# Patient Record
Sex: Female | Born: 1960
Health system: Southern US, Community
[De-identification: ages and names within clinical notes are randomized; demographics above are authoritative.]

## PROBLEM LIST (undated history)

## (undated) DIAGNOSIS — F329 Major depressive disorder, single episode, unspecified: Secondary | ICD-10-CM

## (undated) DIAGNOSIS — I10 Essential (primary) hypertension: Secondary | ICD-10-CM

## (undated) DIAGNOSIS — F32A Depression, unspecified: Secondary | ICD-10-CM

## (undated) DIAGNOSIS — K219 Gastro-esophageal reflux disease without esophagitis: Secondary | ICD-10-CM

## (undated) DIAGNOSIS — J302 Other seasonal allergic rhinitis: Secondary | ICD-10-CM

## (undated) HISTORY — PX: LUMBAR DISC SURGERY: SHX700

## (undated) HISTORY — PX: ABDOMINAL HYSTERECTOMY: SHX81

---

## 2003-10-10 ENCOUNTER — Other Ambulatory Visit: Payer: Self-pay

## 2006-10-06 ENCOUNTER — Ambulatory Visit: Payer: Self-pay | Admitting: Unknown Physician Specialty

## 2006-10-07 ENCOUNTER — Emergency Department: Payer: Self-pay | Admitting: Emergency Medicine

## 2006-11-15 ENCOUNTER — Ambulatory Visit: Payer: Self-pay | Admitting: Unknown Physician Specialty

## 2006-11-18 ENCOUNTER — Ambulatory Visit: Payer: Self-pay | Admitting: Unknown Physician Specialty

## 2006-11-29 ENCOUNTER — Ambulatory Visit: Payer: Self-pay | Admitting: Unknown Physician Specialty

## 2007-02-15 ENCOUNTER — Ambulatory Visit: Payer: Self-pay | Admitting: Unknown Physician Specialty

## 2008-08-17 ENCOUNTER — Ambulatory Visit: Payer: Self-pay | Admitting: Orthopedic Surgery

## 2010-01-20 ENCOUNTER — Ambulatory Visit: Payer: Self-pay

## 2010-08-26 ENCOUNTER — Ambulatory Visit: Payer: Self-pay | Admitting: Internal Medicine

## 2012-03-22 ENCOUNTER — Ambulatory Visit: Payer: Self-pay | Admitting: Internal Medicine

## 2012-08-26 ENCOUNTER — Emergency Department: Payer: Self-pay | Admitting: Emergency Medicine

## 2012-08-28 LAB — BETA STREP CULTURE(ARMC)

## 2013-03-03 ENCOUNTER — Ambulatory Visit: Payer: Self-pay | Admitting: Otolaryngology

## 2013-04-05 ENCOUNTER — Emergency Department: Payer: Self-pay | Admitting: Emergency Medicine

## 2013-04-05 LAB — URINALYSIS, COMPLETE
Bacteria: NONE SEEN
Bilirubin,UR: NEGATIVE
Glucose,UR: NEGATIVE mg/dL (ref 0–75)
Ketone: NEGATIVE
Leukocyte Esterase: NEGATIVE
Nitrite: NEGATIVE
Ph: 7 (ref 4.5–8.0)
Protein: NEGATIVE
RBC,UR: NONE SEEN /HPF (ref 0–5)
Specific Gravity: 1.004 (ref 1.003–1.030)
Squamous Epithelial: 1
WBC UR: 1 /HPF (ref 0–5)

## 2013-04-05 LAB — CBC WITH DIFFERENTIAL/PLATELET
Basophil #: 0 10*3/uL (ref 0.0–0.1)
Basophil %: 0.4 %
Eosinophil #: 0 10*3/uL (ref 0.0–0.7)
Eosinophil %: 1.2 %
HCT: 37 % (ref 35.0–47.0)
HGB: 12.4 g/dL (ref 12.0–16.0)
Lymphocyte #: 1.4 10*3/uL (ref 1.0–3.6)
Lymphocyte %: 34.8 %
MCH: 27 pg (ref 26.0–34.0)
MCHC: 33.7 g/dL (ref 32.0–36.0)
MCV: 80 fL (ref 80–100)
Monocyte #: 0.5 x10 3/mm (ref 0.2–0.9)
Monocyte %: 13.5 %
Neutrophil #: 2 10*3/uL (ref 1.4–6.5)
Neutrophil %: 50.1 %
Platelet: 195 10*3/uL (ref 150–440)
RBC: 4.6 10*6/uL (ref 3.80–5.20)
RDW: 14 % (ref 11.5–14.5)
WBC: 4 10*3/uL (ref 3.6–11.0)

## 2013-04-05 LAB — COMPREHENSIVE METABOLIC PANEL
Albumin: 3.9 g/dL (ref 3.4–5.0)
Alkaline Phosphatase: 167 U/L — ABNORMAL HIGH
Anion Gap: 5 — ABNORMAL LOW (ref 7–16)
BUN: 9 mg/dL (ref 7–18)
Bilirubin,Total: 0.3 mg/dL (ref 0.2–1.0)
Calcium, Total: 9.7 mg/dL (ref 8.5–10.1)
Chloride: 103 mmol/L (ref 98–107)
Co2: 31 mmol/L (ref 21–32)
Creatinine: 0.83 mg/dL (ref 0.60–1.30)
EGFR (African American): >60
EGFR (Non-African Amer.): >60
Glucose: 103 mg/dL — ABNORMAL HIGH (ref 65–99)
Osmolality: 276 (ref 275–301)
Potassium: 4 mmol/L (ref 3.5–5.1)
SGOT(AST): 25 U/L (ref 15–37)
SGPT (ALT): 20 U/L (ref 12–78)
Sodium: 139 mmol/L (ref 136–145)
Total Protein: 8.3 g/dL — ABNORMAL HIGH (ref 6.4–8.2)

## 2013-04-05 LAB — TROPONIN I: Troponin-I: <0.02 ng/mL

## 2013-04-05 LAB — LIPASE, BLOOD: Lipase: 76 U/L (ref 73–393)

## 2014-01-31 DIAGNOSIS — I10 Essential (primary) hypertension: Secondary | ICD-10-CM | POA: Insufficient documentation

## 2014-01-31 DIAGNOSIS — J309 Allergic rhinitis, unspecified: Secondary | ICD-10-CM | POA: Insufficient documentation

## 2014-03-30 ENCOUNTER — Emergency Department: Payer: Self-pay | Admitting: Emergency Medicine

## 2015-01-25 DIAGNOSIS — R5383 Other fatigue: Secondary | ICD-10-CM | POA: Insufficient documentation

## 2015-01-25 DIAGNOSIS — K219 Gastro-esophageal reflux disease without esophagitis: Secondary | ICD-10-CM | POA: Insufficient documentation

## 2015-07-05 ENCOUNTER — Other Ambulatory Visit: Payer: Self-pay | Admitting: Nurse Practitioner

## 2015-07-05 ENCOUNTER — Ambulatory Visit
Admission: RE | Admit: 2015-07-05 | Discharge: 2015-07-05 | Disposition: A | Discharge status: Home / Self Care | Payer: BLUE CROSS/BLUE SHIELD | Source: Ambulatory Visit | Attending: Nurse Practitioner | Admitting: Nurse Practitioner

## 2015-07-05 DIAGNOSIS — M5440 Lumbago with sciatica, unspecified side: Secondary | ICD-10-CM

## 2015-07-05 DIAGNOSIS — R634 Abnormal weight loss: Secondary | ICD-10-CM

## 2015-07-05 DIAGNOSIS — Z1231 Encounter for screening mammogram for malignant neoplasm of breast: Secondary | ICD-10-CM

## 2015-07-30 ENCOUNTER — Ambulatory Visit: Payer: BLUE CROSS/BLUE SHIELD

## 2015-08-09 ENCOUNTER — Ambulatory Visit
Admission: RE | Admit: 2015-08-09 | Discharge: 2015-08-09 | Disposition: A | Discharge status: Home / Self Care | Payer: BLUE CROSS/BLUE SHIELD | Source: Ambulatory Visit | Attending: Nurse Practitioner | Admitting: Nurse Practitioner

## 2015-08-09 DIAGNOSIS — M5440 Lumbago with sciatica, unspecified side: Secondary | ICD-10-CM | POA: Diagnosis present

## 2015-08-09 DIAGNOSIS — M5136 Other intervertebral disc degeneration, lumbar region: Secondary | ICD-10-CM | POA: Diagnosis not present

## 2015-08-09 DIAGNOSIS — M5126 Other intervertebral disc displacement, lumbar region: Secondary | ICD-10-CM | POA: Insufficient documentation

## 2015-08-09 DIAGNOSIS — M4806 Spinal stenosis, lumbar region: Secondary | ICD-10-CM | POA: Diagnosis not present

## 2015-08-13 ENCOUNTER — Telehealth: Payer: Self-pay | Admitting: Gastroenterology

## 2015-08-13 NOTE — Telephone Encounter (Signed)
colonoscopy

## 2015-08-22 ENCOUNTER — Ambulatory Visit
Admission: RE | Admit: 2015-08-22 | Discharge: 2015-08-22 | Disposition: A | Discharge status: Home / Self Care | Payer: BLUE CROSS/BLUE SHIELD | Source: Ambulatory Visit | Attending: Nurse Practitioner | Admitting: Nurse Practitioner

## 2015-08-22 DIAGNOSIS — Z1231 Encounter for screening mammogram for malignant neoplasm of breast: Secondary | ICD-10-CM | POA: Diagnosis not present

## 2015-08-23 ENCOUNTER — Other Ambulatory Visit: Payer: Self-pay

## 2015-08-23 ENCOUNTER — Telehealth: Payer: Self-pay

## 2015-08-23 MED ORDER — PEG 3350-KCL-NABCB-NACL-NASULF 236 G PO SOLR: 4000.0000 mL | Freq: Once | ORAL | Status: DC

## 2015-08-23 NOTE — Telephone Encounter (Signed)
[facility]. Instructs/rx mailed. Please precert.

## 2015-08-23 NOTE — Telephone Encounter (Signed)
Gastroenterology Pre-Procedure Review  Request Date: 08/30/15 Requesting Physician: Dr.   PATIENT REVIEW QUESTIONS: The patient responded to the following health history questions as indicated:    1. Are you having any GI issues? no 2. Do you have a personal history of Polyps? no 3. Do you have a family history of Colon Cancer or Polyps? no 4. Diabetes Mellitus? no 5. Joint replacements in the past 12 months?no 6. Major health problems in the past 3 months?no 7. Any artificial heart valves, MVP, or defibrillator?no    MEDICATIONS & ALLERGIES:    Patient reports the following regarding taking any anticoagulation/antiplatelet therapy:   Plavix, Coumadin, Eliquis, Xarelto, Lovenox, Pradaxa, Brilinta, or Effient? no Aspirin? no  Patient confirms/reports the following medications:  Current Outpatient Prescriptions  Medication Sig Dispense Refill  . ranitidine (ZANTAC) 300 MG tablet Take 300 mg by mouth.    . hydrochlorothiazide (HYDRODIURIL) 25 MG tablet Take 25 mg by mouth.     No current facility-administered medications for this visit.    Patient confirms/reports the following allergies:  Allergies  Allergen Reactions  . Penicillins     No orders of the defined types were placed in this encounter.    AUTHORIZATION INFORMATION Primary Insurance: 1D#: Group #:  Secondary Insurance: 1D#: Group #:  SCHEDULE INFORMATION: Date: 08/30/15 Time: Location:

## 2015-08-28 ENCOUNTER — Encounter: Payer: Self-pay | Admitting: *Deleted

## 2015-08-29 ENCOUNTER — Other Ambulatory Visit: Payer: Self-pay | Admitting: Nurse Practitioner

## 2015-08-29 DIAGNOSIS — R928 Other abnormal and inconclusive findings on diagnostic imaging of breast: Secondary | ICD-10-CM

## 2015-08-29 NOTE — Discharge Instructions (Signed)

## 2015-08-30 ENCOUNTER — Ambulatory Visit: Payer: BLUE CROSS/BLUE SHIELD | Admitting: Anesthesiology

## 2015-08-30 ENCOUNTER — Ambulatory Visit
Admission: RE | Admit: 2015-08-30 | Discharge: 2015-08-30 | Disposition: A | Discharge status: Home / Self Care | Payer: BLUE CROSS/BLUE SHIELD | Source: Ambulatory Visit | Attending: Gastroenterology | Admitting: Gastroenterology

## 2015-08-30 ENCOUNTER — Encounter
Admission: RE | Disposition: A | Discharge status: Home / Self Care | Payer: Self-pay | Source: Ambulatory Visit | Attending: Gastroenterology

## 2015-08-30 DIAGNOSIS — K219 Gastro-esophageal reflux disease without esophagitis: Secondary | ICD-10-CM | POA: Insufficient documentation

## 2015-08-30 DIAGNOSIS — I10 Essential (primary) hypertension: Secondary | ICD-10-CM | POA: Insufficient documentation

## 2015-08-30 DIAGNOSIS — Z880 Allergy status to penicillin: Secondary | ICD-10-CM | POA: Insufficient documentation

## 2015-08-30 DIAGNOSIS — Z79899 Other long term (current) drug therapy: Secondary | ICD-10-CM | POA: Insufficient documentation

## 2015-08-30 DIAGNOSIS — J302 Other seasonal allergic rhinitis: Secondary | ICD-10-CM | POA: Insufficient documentation

## 2015-08-30 DIAGNOSIS — Z90710 Acquired absence of both cervix and uterus: Secondary | ICD-10-CM | POA: Insufficient documentation

## 2015-08-30 DIAGNOSIS — F329 Major depressive disorder, single episode, unspecified: Secondary | ICD-10-CM | POA: Insufficient documentation

## 2015-08-30 DIAGNOSIS — Z1211 Encounter for screening for malignant neoplasm of colon: Secondary | ICD-10-CM | POA: Diagnosis not present

## 2015-08-30 HISTORY — DX: Other seasonal allergic rhinitis: J30.2

## 2015-08-30 HISTORY — PX: COLONOSCOPY WITH PROPOFOL: SHX5780

## 2015-08-30 HISTORY — DX: Depression, unspecified: F32.A

## 2015-08-30 HISTORY — DX: Major depressive disorder, single episode, unspecified: F32.9

## 2015-08-30 HISTORY — DX: Essential (primary) hypertension: I10

## 2015-08-30 HISTORY — DX: Gastro-esophageal reflux disease without esophagitis: K21.9

## 2015-08-30 SURGERY — COLONOSCOPY WITH PROPOFOL
Anesthesia: Monitor Anesthesia Care | Wound class: Contaminated

## 2015-08-30 MED ORDER — LACTATED RINGERS IV SOLN: INTRAVENOUS | Status: DC

## 2015-08-30 MED ORDER — STERILE WATER FOR IRRIGATION IR SOLN
Status: DC | PRN
  Administered 2015-08-30: 12:00:00

## 2015-08-30 MED ORDER — LACTATED RINGERS IV SOLN
INTRAVENOUS | Status: DC | PRN
  Administered 2015-08-30: 12:00:00 via INTRAVENOUS

## 2015-08-30 MED ORDER — ACETAMINOPHEN 160 MG/5ML PO SOLN: 325.0000 mg | ORAL | Status: DC | PRN

## 2015-08-30 MED ORDER — ACETAMINOPHEN 325 MG PO TABS: 325.0000 mg | ORAL_TABLET | ORAL | Status: DC | PRN

## 2015-08-30 MED ORDER — LIDOCAINE HCL (CARDIAC) 20 MG/ML IV SOLN
INTRAVENOUS | Status: DC | PRN
  Administered 2015-08-30: 40 mg via INTRAVENOUS

## 2015-08-30 MED ORDER — PROPOFOL 10 MG/ML IV BOLUS
INTRAVENOUS | Status: DC | PRN
  Administered 2015-08-30: 100 mg via INTRAVENOUS

## 2015-08-30 MED ORDER — ONDANSETRON HCL 4 MG/2ML IJ SOLN: 4.0000 mg | Freq: Once | INTRAMUSCULAR | Status: DC | PRN

## 2015-08-30 SURGICAL SUPPLY — 22 items
CANISTER SUCT 1200ML W/VALVE (MISCELLANEOUS) ×3 IMPLANT
CLIP HMST 235XBRD CATH ROT (MISCELLANEOUS) IMPLANT
CLIP RESOLUTION 360 11X235 (MISCELLANEOUS)
FCP ESCP3.2XJMB 240X2.8X (MISCELLANEOUS)
FORCEPS BIOP RAD 4 LRG CAP 4 (CUTTING FORCEPS) IMPLANT
FORCEPS BIOP RJ4 240 W/NDL (MISCELLANEOUS)
FORCEPS ESCP3.2XJMB 240X2.8X (MISCELLANEOUS) IMPLANT
GOWN CVR UNV OPN BCK APRN NK (MISCELLANEOUS) ×2 IMPLANT
GOWN ISOL THUMB LOOP REG UNIV (MISCELLANEOUS) ×4
INJECTOR VARIJECT VIN23 (MISCELLANEOUS) IMPLANT
KIT DEFENDO VALVE AND CONN (KITS) IMPLANT
KIT ENDO PROCEDURE OLY (KITS) ×3 IMPLANT
MARKER SPOT ENDO TATTOO 5ML (MISCELLANEOUS) IMPLANT
PAD GROUND ADULT SPLIT (MISCELLANEOUS) IMPLANT
PROBE APC STR FIRE (PROBE) IMPLANT
SNARE SHORT THROW 13M SML OVAL (MISCELLANEOUS) IMPLANT
SNARE SHORT THROW 30M LRG OVAL (MISCELLANEOUS) IMPLANT
SNARE SNG USE RND 15MM (INSTRUMENTS) IMPLANT
SPOT EX ENDOSCOPIC TATTOO (MISCELLANEOUS)
TRAP ETRAP POLY (MISCELLANEOUS) IMPLANT
VARIJECT INJECTOR VIN23 (MISCELLANEOUS)
WATER STERILE IRR 250ML POUR (IV SOLUTION) ×3 IMPLANT

## 2015-08-30 NOTE — H&P (Signed)
  [facility]  [address] [place], [place] [address] Phone: [phone] Fax : [phone]  Primary Care Physician:  [facility] Primary Gastroenterologist:  Dr. Servando SnareWohl  Pre-Procedure History & Physical: HPI:  Ansel BongDorothy D Roane is a 55 y.o. female is here for a screening colonoscopy.   Past Medical History  Diagnosis Date  . Seasonal allergies   . Hypertension   . Depression   . GERD (gastroesophageal reflux disease)     Past Surgical History  Procedure Laterality Date  . Abdominal hysterectomy      Prior to Admission medications   Medication Sig Start Date End Date Taking? Authorizing Provider  cetirizine (ZYRTEC) 10 MG tablet Take 10 mg by mouth daily.   Yes Historical Provider, MD  GuaiFENesin (TUSSIN PO) Take by mouth daily as needed.   Yes Historical Provider, MD  hydrochlorothiazide (HYDRODIURIL) 25 MG tablet Take 25 mg by mouth.   Yes Historical Provider, MD  Multiple Vitamin (MULTIVITAMIN) capsule Take 1 capsule by mouth daily.   Yes Historical Provider, MD  sertraline (ZOLOFT) 50 MG tablet Take 50 mg by mouth daily.   Yes Historical Provider, MD  polyethylene glycol (GOLYTELY) 236 g solution Take 4,000 mLs by mouth once. Drink one 8 oz glass every 20 mins until stools are clear. 08/23/15   Midge Miniumarren Marlaine Arey, MD    Allergies as of 08/23/2015 - never reviewed  Allergen Reaction Noted  . Penicillins  08/23/2015    Family History  Problem Relation Age of Onset  . Breast cancer Neg Hx     Social History   Social History  . Marital Status: Single    Spouse Name: N/A  . Number of Children: N/A  . Years of Education: N/A   Occupational History  . Not on file.   Social History Main Topics  . Smoking status: Never Smoker   . Smokeless tobacco: Not on file  . Alcohol Use: No  . Drug Use: Not on file  . Sexual Activity: Not on file   Other Topics Concern  . Not on file   Social History Narrative    Review of  Systems: See HPI, otherwise negative ROS  Physical Exam: BP 135/75 mmHg  Pulse 57  Temp(Src) 98.2 F (36.8 C) (Temporal)  Ht 5\' 7"  (1.702 m)  Wt 132 lb (59.875 kg)  BMI 20.67 kg/m2  SpO2 100% General:   Alert,  pleasant and cooperative in NAD Head:  Normocephalic and atraumatic. Neck:  Supple; no masses or thyromegaly. Lungs:  Clear throughout to auscultation.    Heart:  Regular rate and rhythm. Abdomen:  Soft, nontender and nondistended. Normal bowel sounds, without guarding, and without rebound.   Neurologic:  Alert and  oriented x4;  grossly normal neurologically.  Impression/Plan: Ansel BongDorothy D Sun is now here to undergo a screening colonoscopy.  Risks, benefits, and alternatives regarding colonoscopy have been reviewed with the patient.  Questions have been answered.  All parties agreeable.

## 2015-08-30 NOTE — Transfer of Care (Signed)
Immediate Anesthesia Transfer of Care Note  Patient: Kristin Mcdowell  Procedure(s) Performed: Procedure(s): COLONOSCOPY WITH PROPOFOL (N/A)  Patient Location: PACU  Anesthesia Type: MAC  Level of Consciousness: awake, alert  and patient cooperative  Airway and Oxygen Therapy: Patient Spontanous Breathing and Patient connected to supplemental oxygen  Post-op Assessment: Post-op Vital signs reviewed, Patient's Cardiovascular Status Stable, Respiratory Function Stable, Patent Airway and No signs of Nausea or vomiting  Post-op Vital Signs: Reviewed and stable  Complications: No apparent anesthesia complications

## 2015-08-30 NOTE — Op Note (Signed)
[facility] Gastroenterology Patient Name: Kristin BoyerDorothy Mcdowell Procedure Date: 08/30/2015 11:44 AM MRN: [id number] Account #: [id number] Date of Birth: May 03, 1960 Admit Type: Outpatient Age: [age] [facility] Gender: Female Note Status: Finalized Procedure:            Colonoscopy Indications:          Screening for colorectal malignant neoplasm Providers:            Midge Miniumarren Savian Mazon, MD Referring MD:         Dustin Folkslinic Scott, MD (Referring MD) Medicines:            Propofol per Anesthesia Complications:        No immediate complications. Procedure:            Pre-Anesthesia Assessment:                       - Prior to the procedure, a History and Physical was                        performed, and patient medications and allergies were                        reviewed. The patient's tolerance of previous                        anesthesia was also reviewed. The risks and benefits of                        the procedure and the sedation options and risks were                        discussed with the patient. All questions were                        answered, and informed consent was obtained. Prior                        Anticoagulants: The patient has taken no previous                        anticoagulant or antiplatelet agents. ASA Grade                        Assessment: II - A patient with mild systemic disease.                        After reviewing the risks and benefits, the patient was                        deemed in satisfactory condition to undergo the                        procedure.                       After obtaining informed consent, the colonoscope was                        passed under direct vision. Throughout the procedure,  the patient's blood pressure, pulse, and oxygen                        saturations were monitored continuously. The Olympus CF                        H180AL colonoscope (S#: [id number]) was introduced through                         the anus and advanced to the the cecum, identified by                        appendiceal orifice and ileocecal valve. The                        colonoscopy was performed without difficulty. The                        patient tolerated the procedure well. The quality of                        the bowel preparation was excellent. Findings:      The perianal and digital rectal examinations were normal.      The colon (entire examined portion) appeared normal. Impression:           - The entire examined colon is normal.                       - No specimens collected. Recommendation:       - Repeat colonoscopy in 10 years for screening unless                        any change in family history or lower GI problems. Procedure Code(s):    --- Professional ---                       [phone], Colonoscopy, flexible; diagnostic, including                        collection of specimen(s) by brushing or washing, when                        performed (separate procedure) Diagnosis Code(s):    --- Professional ---                       Z12.11, Encounter for screening for malignant neoplasm                        of colon CPT copyright 2016 American Medical Association. All rights reserved. The codes documented in this report are preliminary and upon coder review may  be revised to meet current compliance requirements. Midge Minium, MD 08/30/2015 12:03:14 PM This report has been signed electronically. Number of Addenda: 0 Note Initiated On: 08/30/2015 11:44 AM Scope Withdrawal Time: 0 hours 6 minutes 9 seconds  Total Procedure Duration: 0 hours 10 minutes 50 seconds       [facility]

## 2015-08-30 NOTE — Anesthesia Preprocedure Evaluation (Signed)
Anesthesia Evaluation  Patient identified by MRN, date of birth, ID band Patient awake    Reviewed: Allergy & Precautions, H&P , NPO status   Airway Mallampati: II  TM Distance: >3 FB Neck ROM: full    Dental   Pulmonary    breath sounds clear to auscultation       Cardiovascular hypertension,  Rhythm:regular Rate:Normal     Neuro/Psych PSYCHIATRIC DISORDERS    GI/Hepatic GERD  ,  Endo/Other    Renal/GU      Musculoskeletal   Abdominal   Peds  Hematology   Anesthesia Other Findings   Reproductive/Obstetrics                             Anesthesia Physical Anesthesia Plan  ASA: II  Anesthesia Plan: MAC   Post-op Pain Management:    Induction:   Airway Management Planned:   Additional Equipment:   Intra-op Plan:   Post-operative Plan:   Informed Consent: I have reviewed the patients History and Physical, chart, labs and discussed the procedure including the risks, benefits and alternatives for the proposed anesthesia with the patient or authorized representative who has indicated his/her understanding and acceptance.     Plan Discussed with: CRNA  Anesthesia Plan Comments:         Anesthesia Quick Evaluation

## 2015-08-30 NOTE — Anesthesia Procedure Notes (Signed)
Procedure Name: MAC Performed by: Johann Gascoigne Pre-anesthesia Checklist: Patient identified, Emergency Drugs available, Suction available, Patient being monitored and Timeout performed Patient Re-evaluated:Patient Re-evaluated prior to inductionOxygen Delivery Method: Nasal cannula       

## 2015-08-30 NOTE — Anesthesia Postprocedure Evaluation (Signed)
Anesthesia Post Note  Patient: Kristin Mcdowell  Procedure(s) Performed: Procedure(s) (LRB): COLONOSCOPY WITH PROPOFOL (N/A)  Patient location during evaluation: PACU Anesthesia Type: MAC Level of consciousness: awake and alert Pain management: pain level controlled Vital Signs Assessment: post-procedure vital signs reviewed and stable Respiratory status: spontaneous breathing, nonlabored ventilation, respiratory function stable and patient connected to nasal cannula oxygen Cardiovascular status: stable and blood pressure returned to baseline Anesthetic complications: no    Durene Fruitshomas,  Mei Suits G

## 2015-09-02 ENCOUNTER — Encounter: Payer: Self-pay | Admitting: Gastroenterology

## 2015-09-27 ENCOUNTER — Other Ambulatory Visit: Payer: BLUE CROSS/BLUE SHIELD

## 2015-10-04 ENCOUNTER — Ambulatory Visit
Admission: RE | Admit: 2015-10-04 | Discharge: 2015-10-04 | Disposition: A | Discharge status: Home / Self Care | Payer: BLUE CROSS/BLUE SHIELD | Source: Ambulatory Visit | Attending: Nurse Practitioner | Admitting: Nurse Practitioner

## 2015-10-04 DIAGNOSIS — N63 Unspecified lump in breast: Secondary | ICD-10-CM | POA: Diagnosis not present

## 2015-10-04 DIAGNOSIS — R928 Other abnormal and inconclusive findings on diagnostic imaging of breast: Secondary | ICD-10-CM | POA: Diagnosis present

## 2016-09-17 ENCOUNTER — Encounter: Payer: Self-pay | Admitting: Emergency Medicine

## 2016-09-17 ENCOUNTER — Emergency Department
Admission: EM | Admit: 2016-09-17 | Discharge: 2016-09-17 | Disposition: A | Discharge status: Home / Self Care | Payer: Medicaid Other | Attending: Emergency Medicine | Admitting: Emergency Medicine

## 2016-09-17 DIAGNOSIS — H6981 Other specified disorders of Eustachian tube, right ear: Secondary | ICD-10-CM

## 2016-09-17 DIAGNOSIS — I10 Essential (primary) hypertension: Secondary | ICD-10-CM | POA: Insufficient documentation

## 2016-09-17 DIAGNOSIS — R0981 Nasal congestion: Secondary | ICD-10-CM | POA: Insufficient documentation

## 2016-09-17 DIAGNOSIS — J019 Acute sinusitis, unspecified: Secondary | ICD-10-CM | POA: Diagnosis not present

## 2016-09-17 DIAGNOSIS — H9201 Otalgia, right ear: Secondary | ICD-10-CM | POA: Diagnosis present

## 2016-09-17 MED ORDER — DOXYCYCLINE HYCLATE 50 MG PO CAPS: 100.0000 mg | ORAL_CAPSULE | Freq: Two times a day (BID) | ORAL | 0 refills | Status: AC

## 2016-09-17 MED ORDER — PREDNISONE 10 MG (21) PO TBPK: ORAL_TABLET | ORAL | 0 refills | Status: DC

## 2016-09-17 NOTE — ED Provider Notes (Signed)
[facility] Emergency Department Provider Note  ____________________________________________  Time seen: Approximately 8:37 PM  I have reviewed the triage vital signs and the nursing notes.   HISTORY  Chief Complaint Otalgia    HPI Kristin Mcdowell is a 56 y.o. female that presents to emergency department with right year fullness for 2 days and sinus congestion for one month. She is having pain over her cheeks. Patient states it feels like her ears are underwater. She applied ear drops to ear this morning without relief. No trauma to ears. She is allergic to penicillin but is not sure what her reaction is. She took a allergy test a couple years ago and told her she was allergic to penicillins and cockroaches. She denies fever, cough, shortness of breath, chest pain, nausea, vomiting, abdominal pain.   Past Medical History:  Diagnosis Date  . Depression   . GERD (gastroesophageal reflux disease)   . Hypertension   . Seasonal allergies     Patient Active Problem List   Diagnosis Date Noted  . Special screening for malignant neoplasms, colon     Past Surgical History:  Procedure Laterality Date  . ABDOMINAL HYSTERECTOMY    . COLONOSCOPY WITH PROPOFOL N/A 08/30/2015   Procedure: COLONOSCOPY WITH PROPOFOL;  Surgeon: Midge Minium, MD;  [facility];  Service: Endoscopy;  Laterality: N/A;    Prior to Admission medications   Medication Sig Start Date End Date Taking? Authorizing Provider  cetirizine (ZYRTEC) 10 MG tablet Take 10 mg by mouth daily.    [provider]  doxycycline (VIBRAMYCIN) 50 MG capsule Take 2 capsules (100 mg total) by mouth 2 (two) times daily. 09/17/16 09/27/16  Enid Derry, PA-C  GuaiFENesin (TUSSIN PO) Take by mouth daily as needed.    [provider]  hydrochlorothiazide (HYDRODIURIL) 25 MG tablet Take 25 mg by mouth.    [provider]  Multiple Vitamin (MULTIVITAMIN) capsule Take 1  capsule by mouth daily.    [provider]  polyethylene glycol (GOLYTELY) 236 g solution Take 4,000 mLs by mouth once. Drink one 8 oz glass every 20 mins until stools are clear. 08/23/15   Midge Minium, MD  predniSONE (STERAPRED UNI-PAK 21 TAB) 10 MG (21) TBPK tablet Take 6 tablets on day 1, take 5 tablets on day 2, take 4 tablets on day 3, take 3 tablets on day 4, take 2 tablets on day 5, take 1 tablet on day 6 09/17/16   Enid Derry, PA-C  sertraline (ZOLOFT) 50 MG tablet Take 50 mg by mouth daily.    [provider]    Allergies Penicillins  Family History  Problem Relation Age of Onset  . Breast cancer Neg Hx     Social History Social History  Substance Use Topics  . Smoking status: Never Smoker  . Smokeless tobacco: Never Used  . Alcohol use No     Review of Systems  Constitutional: No fever/chills Eyes: No visual changes. No discharge. ENT: Positive for congestion and rhinorrhea. Cardiovascular: No chest pain. Respiratory: Negative for cough. No SOB. Gastrointestinal: No abdominal pain.  No nausea, no vomiting.  No diarrhea.  No constipation. Musculoskeletal: Negative for musculoskeletal pain. Skin: Negative for rash, abrasions, lacerations, ecchymosis. Neurological: Negative for headaches.   ____________________________________________   PHYSICAL EXAM:  VITAL SIGNS: ED Triage Vitals  Enc Vitals Group     BP 09/17/16 1850 (!) 168/106     Pulse Rate 09/17/16 1850 69     Resp  09/17/16 1850 20     Temp 09/17/16 1850 98.5 F (36.9 C)     Temp Source 09/17/16 1850 Oral     SpO2 09/17/16 1850 98 %     Weight 09/17/16 1852 132 lb (59.9 kg)     Height --      Head Circumference --      Peak Flow --      Pain Score 09/17/16 1850 7     Pain Loc --      Pain Edu? --      Excl. in GC? --      Constitutional: Alert and oriented. Well appearing and in no acute distress. Eyes: Conjunctivae are normal. PERRL. EOMI. No discharge. Head:  Atraumatic. ENT: Positive for frontal tenderness.      Ears: Tympanic membranes pearly gray with good landmarks. No discharge.      Nose: Mild congestion/rhinnorhea.      Mouth/Throat: Mucous membranes are moist. Oropharynx non-erythematous.  Neck: No stridor.   Hematological/Lymphatic/Immunilogical: No cervical lymphadenopathy. Cardiovascular: Normal rate, regular rhythm.  Good peripheral circulation. Respiratory: Normal respiratory effort without tachypnea or retractions. Lungs CTAB. Good air entry to the bases with no decreased or absent breath sounds. Gastrointestinal: Bowel sounds 4 quadrants. Soft and nontender to palpation. No guarding or rigidity. No palpable masses. No distention. Musculoskeletal: Full range of motion to all extremities. No gross deformities appreciated. Neurologic:  Normal speech and language. No gross focal neurologic deficits are appreciated.  Skin:  Skin is warm, dry and intact. No rash noted.   ____________________________________________   LABS (all labs ordered are listed, but only abnormal results are displayed)  Labs Reviewed - No data to display ____________________________________________  EKG   ____________________________________________  RADIOLOGY  No results found.  ____________________________________________    PROCEDURES  Procedure(s) performed:    Procedures    Medications - No data to display   ____________________________________________   INITIAL IMPRESSION / ASSESSMENT AND PLAN / ED COURSE  Pertinent labs & imaging results that were available during my care of the patient were reviewed by me and considered in my medical decision making (see chart for details).  Review of the [place] CSRS was performed in accordance of the [facility] prior to dispensing any controlled drugs.  Patient's diagnosis is consistent with sinusitis. Vital signs and exam are reassuring. No signs of ear infection. Patient feels comfortable going  home. Patient is allergic to penicillins.  Patient will be discharged home with prescriptions for doxycycline and prednisone. Patient is to follow up with PCP as needed or otherwise directed. Patient is given ED precautions to return to the ED for any worsening or new symptoms.     ____________________________________________  FINAL CLINICAL IMPRESSION(S) / ED DIAGNOSES  Final diagnoses:  Acute sinusitis, recurrence not specified, unspecified location  Dysfunction of right eustachian tube      NEW MEDICATIONS STARTED DURING THIS VISIT:  Discharge Medication List as of 09/17/2016  8:55 PM    START taking these medications   Details  doxycycline (VIBRAMYCIN) 50 MG capsule Take 2 capsules (100 mg total) by mouth 2 (two) times daily., Starting Thu 09/17/2016, Until Sun 09/27/2016, Print    predniSONE (STERAPRED UNI-PAK 21 TAB) 10 MG (21) TBPK tablet Take 6 tablets on day 1, take 5 tablets on day 2, take 4 tablets on day 3, take 3 tablets on day 4, take 2 tablets on day 5, take 1 tablet on day 6, Print  This chart was dictated using voice recognition software/Dragon. Despite best efforts to proofread, errors can occur which can change the meaning. Any change was purely unintentional.    Enid Derry, PA-C 09/17/16 2357    Loleta Rose, MD 09/18/16 [id number]

## 2016-09-17 NOTE — ED Notes (Addendum)
Patient having ear pain on the right side. Tried some ear drops at home without relief. Denies putting anything in ear.

## 2016-09-17 NOTE — ED Triage Notes (Signed)
Pt with right ear pain

## 2017-01-20 ENCOUNTER — Other Ambulatory Visit: Payer: Self-pay | Admitting: Nurse Practitioner

## 2017-01-20 DIAGNOSIS — R928 Other abnormal and inconclusive findings on diagnostic imaging of breast: Secondary | ICD-10-CM

## 2017-02-02 ENCOUNTER — Other Ambulatory Visit: Payer: Self-pay | Admitting: Orthopedic Surgery

## 2017-02-02 DIAGNOSIS — M5136 Other intervertebral disc degeneration, lumbar region: Secondary | ICD-10-CM

## 2017-02-05 ENCOUNTER — Ambulatory Visit: Payer: BLUE CROSS/BLUE SHIELD

## 2017-04-08 ENCOUNTER — Ambulatory Visit
Admission: RE | Admit: 2017-04-08 | Discharge: 2017-04-08 | Disposition: A | Discharge status: Home / Self Care | Payer: Medicaid Other | Source: Ambulatory Visit | Attending: Nurse Practitioner | Admitting: Nurse Practitioner

## 2017-04-08 ENCOUNTER — Other Ambulatory Visit: Payer: Self-pay | Admitting: Nurse Practitioner

## 2017-04-08 DIAGNOSIS — N6001 Solitary cyst of right breast: Secondary | ICD-10-CM | POA: Insufficient documentation

## 2017-04-08 DIAGNOSIS — N644 Mastodynia: Secondary | ICD-10-CM

## 2017-04-08 DIAGNOSIS — R928 Other abnormal and inconclusive findings on diagnostic imaging of breast: Secondary | ICD-10-CM

## 2018-01-30 ENCOUNTER — Encounter: Payer: Self-pay | Admitting: Emergency Medicine

## 2018-01-30 ENCOUNTER — Emergency Department: Payer: Medicaid Other

## 2018-01-30 ENCOUNTER — Other Ambulatory Visit: Payer: Self-pay

## 2018-01-30 ENCOUNTER — Emergency Department
Admission: EM | Admit: 2018-01-30 | Discharge: 2018-01-30 | Disposition: A | Discharge status: Home / Self Care | Payer: Medicaid Other | Attending: Emergency Medicine | Admitting: Emergency Medicine

## 2018-01-30 DIAGNOSIS — I10 Essential (primary) hypertension: Secondary | ICD-10-CM | POA: Insufficient documentation

## 2018-01-30 DIAGNOSIS — Z79899 Other long term (current) drug therapy: Secondary | ICD-10-CM | POA: Insufficient documentation

## 2018-01-30 DIAGNOSIS — R1013 Epigastric pain: Secondary | ICD-10-CM | POA: Insufficient documentation

## 2018-01-30 LAB — CBC
HCT: 39.8 % (ref 36.0–46.0)
Hemoglobin: 12.8 g/dL (ref 12.0–15.0)
MCH: 26.3 pg (ref 26.0–34.0)
MCHC: 32.2 g/dL (ref 30.0–36.0)
MCV: 81.9 fL (ref 80.0–100.0)
Platelets: 267 10*3/uL (ref 150–400)
RBC: 4.86 MIL/uL (ref 3.87–5.11)
RDW: 13.6 % (ref 11.5–15.5)
WBC: 7 10*3/uL (ref 4.0–10.5)
nRBC: 0 % (ref 0.0–0.2)

## 2018-01-30 LAB — COMPREHENSIVE METABOLIC PANEL
ALT: 18 U/L (ref 0–44)
AST: 17 U/L (ref 15–41)
Albumin: 3.9 g/dL (ref 3.5–5.0)
Alkaline Phosphatase: 124 U/L (ref 38–126)
Anion gap: 12 (ref 5–15)
BUN: 18 mg/dL (ref 6–20)
CO2: 27 mmol/L (ref 22–32)
Calcium: 9.3 mg/dL (ref 8.9–10.3)
Chloride: 102 mmol/L (ref 98–111)
Creatinine, Ser: 1.07 mg/dL — ABNORMAL HIGH (ref 0.44–1.00)
GFR calc Af Amer: >60 mL/min (ref >60)
GFR calc non Af Amer: 57 mL/min — ABNORMAL LOW (ref >60)
Glucose, Bld: 111 mg/dL — ABNORMAL HIGH (ref 70–99)
Potassium: 3.6 mmol/L (ref 3.5–5.1)
Sodium: 141 mmol/L (ref 135–145)
Total Bilirubin: 0.8 mg/dL (ref 0.3–1.2)
Total Protein: 7.4 g/dL (ref 6.5–8.1)

## 2018-01-30 LAB — URINALYSIS, COMPLETE (UACMP) WITH MICROSCOPIC
Bilirubin Urine: NEGATIVE
Glucose, UA: NEGATIVE mg/dL
Ketones, ur: NEGATIVE mg/dL
Nitrite: NEGATIVE
Protein, ur: NEGATIVE mg/dL
Specific Gravity, Urine: 1.006 (ref 1.005–1.030)
pH: 6 (ref 5.0–8.0)

## 2018-01-30 LAB — LIPASE, BLOOD: Lipase: 21 U/L (ref 11–51)

## 2018-01-30 MED ORDER — IOHEXOL 300 MG/ML  SOLN
30.0000 mL | Freq: Once | INTRAMUSCULAR | Status: AC | PRN
  Administered 2018-01-30: 30 mL via INTRAVENOUS

## 2018-01-30 MED ORDER — ONDANSETRON 8 MG PO TBDP: 8.0000 mg | ORAL_TABLET | Freq: Three times a day (TID) | ORAL | 0 refills | Status: DC | PRN

## 2018-01-30 MED ORDER — FAMOTIDINE 20 MG PO TABS: 20.0000 mg | ORAL_TABLET | Freq: Two times a day (BID) | ORAL | 0 refills | Status: DC

## 2018-01-30 MED ORDER — IOPAMIDOL (ISOVUE-300) INJECTION 61%
100.0000 mL | Freq: Once | INTRAVENOUS | Status: AC | PRN
  Administered 2018-01-30: 100 mL via INTRAVENOUS

## 2018-01-30 MED ORDER — ONDANSETRON HCL 4 MG/2ML IJ SOLN
4.0000 mg | Freq: Once | INTRAMUSCULAR | Status: AC
  Administered 2018-01-30: 4 mg via INTRAVENOUS
  Filled 2018-01-30: qty 2

## 2018-01-30 NOTE — ED Triage Notes (Signed)
Pt to ED via POV, pt states that she has been having abdominal pain since Thursday. Pt states that she feels like her stomach is swollen and that she is not able to have a good bowel movement, pt has taken OTC medication with little relief. Pt states that she has been burping a lot as well. Pt c/o pain at her abdominal incision from surgery 1 year ago. Pt is in NAD at this time.

## 2018-01-30 NOTE — Discharge Instructions (Signed)
Take the Pepcid twice daily over the next 2 weeks, and the zofran (ondansetron) as needed for nausea.  We have sent a culture of the urine.  If it is positive for infection, you will be called and prescribed an antibiotic.  Return to the ER for new, worsening, persistent abdominal pain, vomiting, fevers, weakness, or any other new or worsening symptoms that concern you.

## 2018-01-30 NOTE — ED Provider Notes (Signed)
[facility] Emergency Department Provider Note ____________________________________________   First MD Initiated Contact with Patient 01/30/18 1349     (approximate)  I have reviewed the triage vital signs and the nursing notes.   HISTORY  Chief Complaint Abdominal Pain    HPI [name] is a 57 y.o. female with PMH as noted below who presents with abdominal pain, described as a discomfort, gradual onset over the last few days, located primarily in her upper abdomen, and associated with distention.  She also reports nausea and belching but no active vomiting.  She reports that she felt constipated, took a laxative and had a small bowel movement.  She does report passing gas over the last few days.  No prior history of this pain or distention.  She had lower spinal surgery going through the abdomen approximately 1 year ago.  Past Medical History:  Diagnosis Date  . Depression   . GERD (gastroesophageal reflux disease)   . Hypertension   . Seasonal allergies     Patient Active Problem List   Diagnosis Date Noted  . Special screening for malignant neoplasms, colon     Past Surgical History:  Procedure Laterality Date  . ABDOMINAL HYSTERECTOMY    . COLONOSCOPY WITH PROPOFOL N/A 08/30/2015   Procedure: COLONOSCOPY WITH PROPOFOL;  Surgeon: Kristin Minium, MD;  Location: [facility];  Service: Endoscopy;  Laterality: N/A;    Prior to Admission medications   Medication Sig Start Date End Date Taking? Authorizing Provider  cetirizine (ZYRTEC) 10 MG tablet Take 10 mg by mouth daily.    [provider]  famotidine (PEPCID) 20 MG tablet Take 1 tablet (20 mg total) by mouth 2 (two) times daily for 15 days. 01/30/18 02/14/18  Dionne Bucy, MD  GuaiFENesin (TUSSIN PO) Take by mouth daily as needed.    [provider]  hydrochlorothiazide (HYDRODIURIL) 25 MG tablet Take 25 mg by mouth.    [provider]  Multiple  Vitamin (MULTIVITAMIN) capsule Take 1 capsule by mouth daily.    [provider]  ondansetron (ZOFRAN ODT) 8 MG disintegrating tablet Take 1 tablet (8 mg total) by mouth every 8 (eight) hours as needed for nausea or vomiting. 01/30/18   Dionne Bucy, MD  polyethylene glycol (GOLYTELY) 236 g solution Take 4,000 mLs by mouth once. Drink one 8 oz glass every 20 mins until stools are clear. 08/23/15   Kristin Minium, MD  predniSONE (STERAPRED UNI-PAK 21 TAB) 10 MG (21) TBPK tablet Take 6 tablets on day 1, take 5 tablets on day 2, take 4 tablets on day 3, take 3 tablets on day 4, take 2 tablets on day 5, take 1 tablet on day 6 09/17/16   Enid Derry, PA-C  sertraline (ZOLOFT) 50 MG tablet Take 50 mg by mouth daily.    [provider]    Allergies Penicillins  Family History  Problem Relation Age of Onset  . Breast cancer Neg Hx     Social History Social History   Tobacco Use  . Smoking status: Never Smoker  . Smokeless tobacco: Never Used  Substance Use Topics  . Alcohol use: No  . Drug use: Not on file    Review of Systems  Constitutional: No fever. Eyes: No redness. ENT: No sore throat. Cardiovascular: Denies chest pain. Respiratory: Denies shortness of breath. Gastrointestinal: Positive for nausea.  Genitourinary: Negative for flank pain.  Musculoskeletal: Negative for back pain. Skin: Negative for rash. Neurological: Negative for headache.  ____________________________________________   PHYSICAL EXAM:  VITAL SIGNS: ED Triage Vitals  Enc Vitals Group     BP 01/30/18 1054 (!) 139/93     Pulse Rate 01/30/18 1054 94     Resp 01/30/18 1054 16     Temp 01/30/18 1054 98.2 F (36.8 C)     Temp src --      SpO2 01/30/18 1054 96 %     Weight 01/30/18 1053 184 lb (83.5 kg)     Height 01/30/18 1053 5\' 7"  (1.702 m)     Head Circumference --      Peak Flow --      Pain Score 01/30/18 1053 9     Pain Loc --      Pain Edu? --      Excl. in GC? --      Constitutional: Alert and oriented.  Uncomfortable appearing but in no acute distress. Eyes: Conjunctivae are normal.  No scleral icterus. Head: Atraumatic. Nose: No congestion/rhinnorhea. Mouth/Throat: Mucous membranes are moist.   Neck: Normal range of motion.  Cardiovascular:  Good peripheral circulation. Respiratory: Normal respiratory effort.  Gastrointestinal: Soft with some epigastric tenderness.  Mild distention.  Genitourinary: No flank tenderness. Musculoskeletal: Extremities warm and well perfused.  Neurologic:  Normal speech and language. No gross focal neurologic deficits are appreciated.  Skin:  Skin is warm and dry. No rash noted. Psychiatric: Mood and affect are normal. Speech and behavior are normal.  ____________________________________________   LABS (all labs ordered are listed, but only abnormal results are displayed)  Labs Reviewed  COMPREHENSIVE METABOLIC PANEL - Abnormal; Notable for the following components:      Result Value   Glucose, Bld 111 (*)    Creatinine, Ser 1.07 (*)    GFR calc non Af Amer 57 (*)    All other components within normal limits  URINALYSIS, COMPLETE (UACMP) WITH MICROSCOPIC - Abnormal; Notable for the following components:   Color, Urine YELLOW (*)    APPearance HAZY (*)    Hgb urine dipstick SMALL (*)    Leukocytes, UA MODERATE (*)    Bacteria, UA RARE (*)    All other components within normal limits  URINE CULTURE  LIPASE, BLOOD  CBC   ____________________________________________  EKG   ____________________________________________  RADIOLOGY  CT abdomen: No acute abnormalities  ____________________________________________   PROCEDURES  Procedure(s) performed: No  Procedures  Critical Care performed: No ____________________________________________   INITIAL IMPRESSION / ASSESSMENT AND PLAN / ED COURSE  Pertinent labs & imaging results that were available during my care of the patient were reviewed  by me and considered in my medical decision making (see chart for details).  57 year old female with PMH as noted above and status post spinal surgery going through the abdomen last year presents with abdominal distention, epigastric abdominal pain, nausea and belching over the last few days.  On exam her vital signs are normal.  The abdomen is soft with some epigastric tenderness and mild distention.  The remainder of the exam is unremarkable.  Differential includes bowel obstruction but also gastroparesis, gastritis, or other benign etiology.  Her lab work-up is unremarkable.  Given the risk of SBO, I will obtain a CT abdomen to further assess.  ----------------------------------------- 5:49 PM on 01/30/2018 -----------------------------------------  The CT shows no significant acute findings to explain the patient's pain.  The UA showed WBCs and leukocytes, but no nitrates.  The patient has no suprapubic pain, fever, or dysuria.  Therefore my clinical suspicion for UTI  is very low.  After discussion with the patient, we will defer any treatment at this time.  I have ordered a urine culture, the patient will be contacted if it comes back positive.  I counseled the patient on the results of the scan.  Her abdominal pain, nausea, and belching are most consistent with gastritis.  I will prescribe Pepcid and Zofran.  The patient feels comfortable to go home.  Return precautions given, and she expresses understanding. ____________________________________________   FINAL CLINICAL IMPRESSION(S) / ED DIAGNOSES  Final diagnoses:  Epigastric pain      NEW MEDICATIONS STARTED DURING THIS VISIT:  New Prescriptions   FAMOTIDINE (PEPCID) 20 MG TABLET    Take 1 tablet (20 mg total) by mouth 2 (two) times daily for 15 days.   ONDANSETRON (ZOFRAN ODT) 8 MG DISINTEGRATING TABLET    Take 1 tablet (8 mg total) by mouth every 8 (eight) hours as needed for nausea or vomiting.     Note:  This document  was prepared using Dragon voice recognition software and may include unintentional dictation errors.    Dionne Bucy, MD 01/30/18 1750

## 2018-02-01 LAB — URINE CULTURE: Culture: NO GROWTH

## 2018-03-11 ENCOUNTER — Encounter: Payer: Self-pay | Admitting: Emergency Medicine

## 2018-03-11 ENCOUNTER — Emergency Department: Payer: Medicaid Other

## 2018-03-11 ENCOUNTER — Other Ambulatory Visit: Payer: Self-pay

## 2018-03-11 ENCOUNTER — Emergency Department
Admission: EM | Admit: 2018-03-11 | Discharge: 2018-03-11 | Disposition: A | Discharge status: Home / Self Care | Payer: Medicaid Other | Attending: Emergency Medicine | Admitting: Emergency Medicine

## 2018-03-11 DIAGNOSIS — M5431 Sciatica, right side: Secondary | ICD-10-CM | POA: Insufficient documentation

## 2018-03-11 DIAGNOSIS — M545 Low back pain: Secondary | ICD-10-CM | POA: Diagnosis present

## 2018-03-11 DIAGNOSIS — Z79899 Other long term (current) drug therapy: Secondary | ICD-10-CM | POA: Diagnosis not present

## 2018-03-11 DIAGNOSIS — I10 Essential (primary) hypertension: Secondary | ICD-10-CM | POA: Insufficient documentation

## 2018-03-11 MED ORDER — OXYCODONE-ACETAMINOPHEN 7.5-325 MG PO TABS: 1.0000 | ORAL_TABLET | Freq: Four times a day (QID) | ORAL | 0 refills | Status: DC | PRN

## 2018-03-11 MED ORDER — ORPHENADRINE CITRATE 30 MG/ML IJ SOLN
60.0000 mg | Freq: Two times a day (BID) | INTRAMUSCULAR | Status: DC
  Administered 2018-03-11: 60 mg via INTRAMUSCULAR
  Filled 2018-03-11: qty 2

## 2018-03-11 MED ORDER — KETOROLAC TROMETHAMINE 60 MG/2ML IM SOLN: 60.0000 mg | Freq: Once | INTRAMUSCULAR | Status: DC

## 2018-03-11 MED ORDER — METHYLPREDNISOLONE SODIUM SUCC 125 MG IJ SOLR
125.0000 mg | Freq: Once | INTRAMUSCULAR | Status: AC
  Administered 2018-03-11: 125 mg via INTRAMUSCULAR
  Filled 2018-03-11: qty 2

## 2018-03-11 MED ORDER — METHYLPREDNISOLONE 4 MG PO TBPK: ORAL_TABLET | ORAL | 0 refills | Status: DC

## 2018-03-11 NOTE — ED Notes (Signed)
FIRST NURSE NOTE:  Pt arrives POV for leg and back pain. Pt seen by PCP.

## 2018-03-11 NOTE — ED Triage Notes (Signed)
Patient states 2 weeks ago she was sitting in the floor for an extended period of time and when she went to stand up she was unable to stand. Patient states since then, there has been no improvement in pain to right lower back and leg. Patient denies any injury that she can think of.

## 2018-03-11 NOTE — ED Provider Notes (Signed)
[facility] Emergency Department Provider Note   ____________________________________________   First MD Initiated Contact with Patient 03/11/18 [phone]     (approximate)  I have reviewed the triage vital signs and the nursing notes.   HISTORY  Chief Complaint Back Pain    HPI Kristin Mcdowell is a 57 y.o. female patient complain of radicular back pain to the right lower extremity for 5 days.  Patient states she had low back pain 5 radiculopathy for 2 weeks status post prolonged sitting on the floor.  Patient state after prolonged sitting when she attempted to rise she felt a catch in her back and since then the pain has worsened.  Patient denies bladder bowel dysfunction.  Patient states she had a lumbar fusion in 2018 she did not remember which lumbar were involved.  Patient stated the last 2 days unable to sleep secondary to the pain.  Patient described the pain is "throbbing".  Patient rates the pain as a 10/10.  No relief ibuprofen.  Past Medical History:  Diagnosis Date  . Depression   . GERD (gastroesophageal reflux disease)   . Hypertension   . Seasonal allergies     Patient Active Problem List   Diagnosis Date Noted  . Special screening for malignant neoplasms, colon     Past Surgical History:  Procedure Laterality Date  . ABDOMINAL HYSTERECTOMY    . COLONOSCOPY WITH PROPOFOL N/A 08/30/2015   Procedure: COLONOSCOPY WITH PROPOFOL;  Surgeon: Midge Minium, MD;  Location: [facility];  Service: Endoscopy;  Laterality: N/A;    Prior to Admission medications   Medication Sig Start Date End Date Taking? Authorizing Provider  cetirizine (ZYRTEC) 10 MG tablet Take 10 mg by mouth daily.    [provider]  famotidine (PEPCID) 20 MG tablet Take 1 tablet (20 mg total) by mouth 2 (two) times daily for 15 days. 01/30/18 02/14/18  Dionne Bucy, MD  GuaiFENesin (TUSSIN PO) Take by mouth daily as needed.    [provider]    hydrochlorothiazide (HYDRODIURIL) 25 MG tablet Take 25 mg by mouth.    [provider]  methylPREDNISolone (MEDROL DOSEPAK) 4 MG TBPK tablet Take Tapered dose as directed 03/11/18   Joni Reining, PA-C  Multiple Vitamin (MULTIVITAMIN) capsule Take 1 capsule by mouth daily.    [provider]  ondansetron (ZOFRAN ODT) 8 MG disintegrating tablet Take 1 tablet (8 mg total) by mouth every 8 (eight) hours as needed for nausea or vomiting. 01/30/18   Dionne Bucy, MD  oxyCODONE-acetaminophen (PERCOCET) 7.5-325 MG tablet Take 1 tablet by mouth every 6 (six) hours as needed. 03/11/18   Joni Reining, PA-C  polyethylene glycol (GOLYTELY) 236 g solution Take 4,000 mLs by mouth once. Drink one 8 oz glass every 20 mins until stools are clear. 08/23/15   Midge Minium, MD  predniSONE (STERAPRED UNI-PAK 21 TAB) 10 MG (21) TBPK tablet Take 6 tablets on day 1, take 5 tablets on day 2, take 4 tablets on day 3, take 3 tablets on day 4, take 2 tablets on day 5, take 1 tablet on day 6 09/17/16   Enid Derry, PA-C  sertraline (ZOLOFT) 50 MG tablet Take 50 mg by mouth daily.    [provider]    Allergies Penicillins  Family History  Problem Relation Age of Onset  . Breast cancer Neg Hx     Social History Social History   Tobacco Use  . Smoking status: Never Smoker  .  Smokeless tobacco: Never Used  Substance Use Topics  . Alcohol use: No  . Drug use: Not on file    Review of Systems  Constitutional: No fever/chills Eyes: No visual changes. ENT: No sore throat. Cardiovascular: Denies chest pain. Respiratory: Denies shortness of breath. Gastrointestinal: No abdominal pain.  No nausea, no vomiting.  No diarrhea.  No constipation. Genitourinary: Negative for dysuria. Musculoskeletal: Negative for back pain. Skin: Negative for rash. Neurological: Negative for headaches, focal weakness or  numbness. Psychiatric:Depression. Endocrine:Hypertension. Allergic/Immunilogical: Penicillin. ____________________________________________   PHYSICAL EXAM:  VITAL SIGNS: ED Triage Vitals  Enc Vitals Group     BP 03/11/18 0833 129/88     Pulse Rate 03/11/18 0833 90     Resp 03/11/18 0833 16     Temp 03/11/18 0833 97.9 F (36.6 C)     Temp Source 03/11/18 0833 Oral     SpO2 03/11/18 0833 100 %     Weight 03/11/18 0834 182 lb (82.6 kg)     Height 03/11/18 0834 5\' 6"  (1.676 m)     Head Circumference --      Peak Flow --      Pain Score 03/11/18 0834 10     Pain Loc --      Pain Edu? --      Excl. in GC? --     Constitutional: Alert and oriented.  Moderate distress.   Cardiovascular: Normal rate, regular rhythm. Grossly normal heart sounds.  Good peripheral circulation. Respiratory: Normal respiratory effort.  No retractions. Lungs CTAB. Gastrointestinal: Soft and nontender. No distention. No abdominal bruits. No CVA tenderness. Musculoskeletal: No obvious spinal deformity.  Patient has moderate guarding palpation of L3-L5.  Patient has positive right legs test. Neurologic:  Normal speech and language. No gross focal neurologic deficits are appreciated. No gait instability. Skin:  Skin is warm, dry and intact. No rash noted. Psychiatric: Mood and affect are normal. Speech and behavior are normal.  ____________________________________________   LABS (all labs ordered are listed, but only abnormal results are displayed)  Labs Reviewed - No data to display ____________________________________________  EKG   ____________________________________________  RADIOLOGY  ED MD interpretation:    Official radiology report(s): Dg Lumbar Spine Complete  Result Date: 03/11/2018 CLINICAL DATA:  Back pain radiating into the right leg for 1 month, no known injury, initial encounter EXAM: LUMBAR SPINE - COMPLETE 4+ VIEW COMPARISON:  01/30/2018 FINDINGS: Five lumbar type vertebral  bodies are well visualized. Vertebral body height is well maintained. Fusion at L5-S1 is again noted and stable from prior examination. No pars defects are seen. No anterolisthesis is noted. No soft tissue abnormality is seen. IMPRESSION: Postsurgical changes at L5-S1 stable from the previous exam. No acute abnormality noted. Electronically Signed   By: Alcide CleverMark  Lukens M.D.   On: 03/11/2018 09:49    ____________________________________________   PROCEDURES  Procedure(s) performed: None  Procedures  Critical Care performed: No  ____________________________________________   INITIAL IMPRESSION / ASSESSMENT AND PLAN / ED COURSE  As part of my medical decision making, I reviewed the following data within the electronic MEDICAL RECORD NUMBER   Patient presents with Declaire back pain for 5 days.  Patient has a history of lumbar fusion.  Discussed x-ray findings with patient and advised we have no comparison films.  Patient given discharge care instructions and advised to follow-up with her treating orthopedic doctor.  Take medication as directed.       ____________________________________________   FINAL CLINICAL IMPRESSION(S) / ED DIAGNOSES  Final  diagnoses:  Sciatica of right side     ED Discharge Orders         Ordered    oxyCODONE-acetaminophen (PERCOCET) 7.5-325 MG tablet  Every 6 hours PRN     03/11/18 1039    methylPREDNISolone (MEDROL DOSEPAK) 4 MG TBPK tablet     03/11/18 1039           Note:  This document was prepared using Dragon voice recognition software and may include unintentional dictation errors.    Joni Reining, PA-C 03/11/18 1049    Jene Every, MD 03/13/18 2225

## 2018-03-11 NOTE — Discharge Instructions (Addendum)
Continue muscle relaxants.prescription medication as directed.  Advised to follow-up with treating orthopedic doctor for reevaluation.

## 2018-03-14 ENCOUNTER — Other Ambulatory Visit: Payer: Self-pay

## 2018-03-14 ENCOUNTER — Encounter: Payer: Self-pay | Admitting: Emergency Medicine

## 2018-03-14 ENCOUNTER — Emergency Department
Admission: EM | Admit: 2018-03-14 | Discharge: 2018-03-14 | Disposition: A | Discharge status: Home / Self Care | Payer: Medicaid Other | Attending: Emergency Medicine | Admitting: Emergency Medicine

## 2018-03-14 ENCOUNTER — Emergency Department: Payer: Medicaid Other

## 2018-03-14 DIAGNOSIS — Z79899 Other long term (current) drug therapy: Secondary | ICD-10-CM | POA: Insufficient documentation

## 2018-03-14 DIAGNOSIS — I10 Essential (primary) hypertension: Secondary | ICD-10-CM | POA: Diagnosis not present

## 2018-03-14 DIAGNOSIS — R2241 Localized swelling, mass and lump, right lower limb: Secondary | ICD-10-CM | POA: Insufficient documentation

## 2018-03-14 DIAGNOSIS — M5431 Sciatica, right side: Secondary | ICD-10-CM | POA: Insufficient documentation

## 2018-03-14 DIAGNOSIS — M79604 Pain in right leg: Secondary | ICD-10-CM | POA: Diagnosis present

## 2018-03-14 MED ORDER — HYDROMORPHONE HCL 1 MG/ML IJ SOLN
1.0000 mg | Freq: Once | INTRAMUSCULAR | Status: AC
  Administered 2018-03-14: 1 mg via INTRAMUSCULAR
  Filled 2018-03-14: qty 1

## 2018-03-14 MED ORDER — OXYCODONE-ACETAMINOPHEN 5-325 MG PO TABS: 1.0000 | ORAL_TABLET | ORAL | 0 refills | Status: DC | PRN

## 2018-03-14 NOTE — ED Provider Notes (Signed)
[facility] Emergency Department Provider Note  Time seen: 9:09 AM  I have reviewed the triage vital signs and the nursing notes.   HISTORY  Chief Complaint Leg Pain    HPI Kristin Mcdowell is a 57 y.o. female with a past medical history of depression, gastric reflux, hypertension, chronic back pain, presents to the emergency department with complaints of right leg pain.  Patient was seen in the emergency department 03/11/2018 with the same complaints, had an x-ray that was performed that was negative.  Patient was discharged with prednisone and Percocet.  Patient states she continues to have pain and now feels like there is some swelling and pain behind the right knee so she returned to the emergency department.  Patient denies any incontinence.  Denies any weakness or numbness but does state pain with attempted ambulation.  Uses a cane to ambulate.  She also states she sees pain management for her lower back pain, last year had a lower lumbar fusion.  Patient states she takes Flexeril at baseline.   Past Medical History:  Diagnosis Date  . Depression   . GERD (gastroesophageal reflux disease)   . Hypertension   . Seasonal allergies     Patient Active Problem List   Diagnosis Date Noted  . Special screening for malignant neoplasms, colon     Past Surgical History:  Procedure Laterality Date  . ABDOMINAL HYSTERECTOMY    . COLONOSCOPY WITH PROPOFOL N/A 08/30/2015   Procedure: COLONOSCOPY WITH PROPOFOL;  Surgeon: Midge Minium, MD;  Location: [facility];  Service: Endoscopy;  Laterality: N/A;    Prior to Admission medications   Medication Sig Start Date End Date Taking? Authorizing Provider  cetirizine (ZYRTEC) 10 MG tablet Take 10 mg by mouth daily.    [provider]  famotidine (PEPCID) 20 MG tablet Take 1 tablet (20 mg total) by mouth 2 (two) times daily for 15 days. 01/30/18 02/14/18  Dionne Bucy, MD  GuaiFENesin (TUSSIN PO)  Take by mouth daily as needed.    [provider]  hydrochlorothiazide (HYDRODIURIL) 25 MG tablet Take 25 mg by mouth.    [provider]  methylPREDNISolone (MEDROL DOSEPAK) 4 MG TBPK tablet Take Tapered dose as directed 03/11/18   Joni Reining, PA-C  Multiple Vitamin (MULTIVITAMIN) capsule Take 1 capsule by mouth daily.    [provider]  ondansetron (ZOFRAN ODT) 8 MG disintegrating tablet Take 1 tablet (8 mg total) by mouth every 8 (eight) hours as needed for nausea or vomiting. 01/30/18   Dionne Bucy, MD  oxyCODONE-acetaminophen (PERCOCET) 7.5-325 MG tablet Take 1 tablet by mouth every 6 (six) hours as needed. 03/11/18   Joni Reining, PA-C  polyethylene glycol (GOLYTELY) 236 g solution Take 4,000 mLs by mouth once. Drink one 8 oz glass every 20 mins until stools are clear. 08/23/15   Midge Minium, MD  predniSONE (STERAPRED UNI-PAK 21 TAB) 10 MG (21) TBPK tablet Take 6 tablets on day 1, take 5 tablets on day 2, take 4 tablets on day 3, take 3 tablets on day 4, take 2 tablets on day 5, take 1 tablet on day 6 09/17/16   Enid Derry, PA-C  sertraline (ZOLOFT) 50 MG tablet Take 50 mg by mouth daily.    [provider]    Allergies  Allergen Reactions  . Penicillins     By test Other reaction(s): Other (See Comments) By test    Family History  Problem Relation Age of Onset  .  Breast cancer Neg Hx     Social History Social History   Tobacco Use  . Smoking status: Never Smoker  . Smokeless tobacco: Never Used  Substance Use Topics  . Alcohol use: No  . Drug use: Not on file    Review of Systems Constitutional: Negative for fever. Cardiovascular: Negative for chest pain. Respiratory: Negative for shortness of breath. Gastrointestinal: Negative for abdominal pain Musculoskeletal: Right leg pain, pelvic pain Skin: Negative for skin complaints  Neurological: Negative for headache All other ROS  negative  ____________________________________________   PHYSICAL EXAM:  VITAL SIGNS: ED Triage Vitals  Enc Vitals Group     BP 03/14/18 0854 135/89     Pulse Rate 03/14/18 0854 88     Resp 03/14/18 0854 18     Temp 03/14/18 0854 97.9 F (36.6 C)     Temp Source 03/14/18 0854 Oral     SpO2 03/14/18 0854 98 %     Weight 03/14/18 0854 182 lb (82.6 kg)     Height 03/14/18 0854 5\' 5"  (1.651 m)     Head Circumference --      Peak Flow --      Pain Score 03/14/18 0900 10     Pain Loc --      Pain Edu? --      Excl. in GC? --     Constitutional: Alert and oriented. Well appearing and in no distress. Eyes: Normal exam ENT   Head: Normocephalic and atraumatic   Mouth/Throat: Mucous membranes are moist. Cardiovascular: Normal rate, regular rhythm. No murmur Respiratory: Normal respiratory effort without tachypnea nor retractions. Breath sounds are clear  Gastrointestinal: Soft and nontender. No distention.  Musculoskeletal: Nontender with normal range of motion in all extremities.  Mild tenderness throughout the entire right lower extremity, with mild popliteal fossa tenderness.  No mass palpated.  Pain with range of motion of the right hip/right leg. Neurologic:  Normal speech and language. No gross focal neurologic deficits Skin:  Skin is warm, dry and intact.  Psychiatric: Mood and affect are normal.   ____________________________________________   RADIOLOGY  There is an shows a small Baker's cyst otherwise negative.  ____________________________________________   INITIAL IMPRESSION / ASSESSMENT AND PLAN / ED COURSE  Pertinent labs & imaging results that were available during my care of the patient were reviewed by me and considered in my medical decision making (see chart for details).  Patient presents to the emergency department with continued right leg pain.  Seen for the same 03/11/2018 discharged with prednisone and Percocet.  Overall the patient symptoms  are most suggestive of sciatica.  States a history of lower back pain and sciatica on the right leg.  Given her complaint of subjective swelling although not appreciated on exam we will obtain an ultrasound to further evaluate and ensure that there is no DVT or Baker's cyst.  Patient is agreeable to plan of care.  Otherwise I discussed with the patient to continue her steroids and pain medication as needed, follow-up with her orthopedist, and pain management physician.  Patient agreeable to this plan of care.  Ultrasound negative for DVT, small Baker's cyst noted.  We will discharge with a short course of pain medication for the patient and have her follow-up with her orthopedist.  ____________________________________________   FINAL CLINICAL IMPRESSION(S) / ED DIAGNOSES  Right leg pain Lower back pain    Minna AntisPaduchowski, Monti Jilek, MD 03/14/18 1111

## 2018-03-14 NOTE — ED Triage Notes (Signed)
Pt arrived via POV with reports of right leg pain that has not improved since starting steroids and percocet over the weekend. Pt states 2 weeks ago she was sitting on the floor for an extended period of time.  Pt states the pain is in her groin area and radiates down to her knee. Pt states she feels a knot on the back of her knee.  Pt was seen here Friday and given 2 shots and had an XR done. Pt states it feels like her right leg is going to give out.

## 2018-03-14 NOTE — ED Notes (Signed)
Patient reports pain from right groin down right leg with minimal swelling noted. Patient denies injury.

## 2018-03-28 ENCOUNTER — Other Ambulatory Visit (HOSPITAL_COMMUNITY): Payer: Self-pay | Admitting: Orthopedic Surgery

## 2018-03-28 ENCOUNTER — Other Ambulatory Visit: Payer: Self-pay | Admitting: Orthopedic Surgery

## 2018-03-28 DIAGNOSIS — M5136 Other intervertebral disc degeneration, lumbar region: Secondary | ICD-10-CM

## 2018-03-28 DIAGNOSIS — M5441 Lumbago with sciatica, right side: Secondary | ICD-10-CM

## 2018-04-14 ENCOUNTER — Ambulatory Visit
Admission: RE | Admit: 2018-04-14 | Discharge: 2018-04-14 | Disposition: A | Discharge status: Home / Self Care | Payer: Medicaid Other | Source: Ambulatory Visit | Attending: Orthopedic Surgery | Admitting: Orthopedic Surgery

## 2018-04-14 DIAGNOSIS — M5441 Lumbago with sciatica, right side: Secondary | ICD-10-CM | POA: Diagnosis not present

## 2018-04-14 DIAGNOSIS — M5136 Other intervertebral disc degeneration, lumbar region: Secondary | ICD-10-CM | POA: Insufficient documentation

## 2018-06-17 ENCOUNTER — Encounter: Payer: Self-pay | Admitting: Emergency Medicine

## 2018-06-17 ENCOUNTER — Emergency Department: Payer: Medicare HMO

## 2018-06-17 ENCOUNTER — Emergency Department
Admission: EM | Admit: 2018-06-17 | Discharge: 2018-06-17 | Disposition: A | Discharge status: Home / Self Care | Payer: Medicare HMO | Attending: Emergency Medicine | Admitting: Emergency Medicine

## 2018-06-17 ENCOUNTER — Other Ambulatory Visit: Payer: Self-pay

## 2018-06-17 DIAGNOSIS — Z20828 Contact with and (suspected) exposure to other viral communicable diseases: Secondary | ICD-10-CM

## 2018-06-17 DIAGNOSIS — B9789 Other viral agents as the cause of diseases classified elsewhere: Secondary | ICD-10-CM

## 2018-06-17 DIAGNOSIS — R05 Cough: Secondary | ICD-10-CM | POA: Diagnosis present

## 2018-06-17 DIAGNOSIS — J069 Acute upper respiratory infection, unspecified: Secondary | ICD-10-CM | POA: Insufficient documentation

## 2018-06-17 MED ORDER — BENZONATATE 100 MG PO CAPS: ORAL_CAPSULE | ORAL | 0 refills | Status: DC

## 2018-06-17 MED ORDER — OSELTAMIVIR PHOSPHATE 75 MG PO CAPS: 75.0000 mg | ORAL_CAPSULE | Freq: Every day | ORAL | 0 refills | Status: AC

## 2018-06-17 MED ORDER — PREDNISONE 20 MG PO TABS: 20.0000 mg | ORAL_TABLET | Freq: Two times a day (BID) | ORAL | 0 refills | Status: AC

## 2018-06-17 NOTE — ED Notes (Signed)
See triage note  States she has had some subjective fever,cough and some discomfort in chest with cough  Has been exposed to flu  Afebrile on arrival

## 2018-06-17 NOTE — ED Provider Notes (Addendum)
[facility] Emergency Department Provider Note ____________________________________________  Time seen: 1412  I have reviewed the triage vital signs and the nursing notes.  HISTORY  Chief Complaint  Cough   HPI Kristin Mcdowell is a 58 y.o. female presents to the ED for evaluation of a nearly 2-week complaint of intermittent cough and congestion.  Patient is also a household contact of her 9-year-old granddaughter, who is confirmed flu positive with a pneumonia last night.  Patient is unaware of any frank fevers, but has noted evening sweats and chills.  She reports a nonproductive cough, sinus pressure, and sore throat.  Past Medical History:  Diagnosis Date  . Depression   . GERD (gastroesophageal reflux disease)   . Hypertension   . Seasonal allergies     Patient Active Problem List   Diagnosis Date Noted  . Special screening for malignant neoplasms, colon     Past Surgical History:  Procedure Laterality Date  . ABDOMINAL HYSTERECTOMY    . COLONOSCOPY WITH PROPOFOL N/A 08/30/2015   Procedure: COLONOSCOPY WITH PROPOFOL;  Surgeon: Midge Minium, MD;  Location: [facility];  Service: Endoscopy;  Laterality: N/A;    Prior to Admission medications   Medication Sig Start Date End Date Taking? Authorizing Provider  benzonatate (TESSALON PERLES) 100 MG capsule Take 1-2 tabs TID prn cough 06/17/18   Werner Labella, Charlesetta Ivory, PA-C  cetirizine (ZYRTEC) 10 MG tablet Take 10 mg by mouth daily.    [provider]  famotidine (PEPCID) 20 MG tablet Take 1 tablet (20 mg total) by mouth 2 (two) times daily for 15 days. 01/30/18 02/14/18  Dionne Bucy, MD  GuaiFENesin (TUSSIN PO) Take by mouth daily as needed.    [provider]  hydrochlorothiazide (HYDRODIURIL) 25 MG tablet Take 25 mg by mouth.    [provider]  Multiple Vitamin (MULTIVITAMIN) capsule Take 1 capsule by mouth daily.    [provider]  oseltamivir  (TAMIFLU) 75 MG capsule Take 1 capsule (75 mg total) by mouth daily for 10 days. 06/17/18 06/27/18  Evey Mcmahan, Charlesetta Ivory, PA-C  polyethylene glycol (GOLYTELY) 236 g solution Take 4,000 mLs by mouth once. Drink one 8 oz glass every 20 mins until stools are clear. 08/23/15   Midge Minium, MD  predniSONE (DELTASONE) 20 MG tablet Take 1 tablet (20 mg total) by mouth 2 (two) times daily with a meal for 5 days. 06/17/18 06/22/18  Virgel Haro, Charlesetta Ivory, PA-C  sertraline (ZOLOFT) 50 MG tablet Take 50 mg by mouth daily.    [provider]    Allergies Penicillins  Family History  Problem Relation Age of Onset  . Breast cancer Neg Hx     Social History Social History   Tobacco Use  . Smoking status: Never Smoker  . Smokeless tobacco: Never Used  Substance Use Topics  . Alcohol use: No  . Drug use: Not on file    Review of Systems  Constitutional: Negative for fever. Eyes: Negative for visual changes. ENT: Negative for sore throat.  Reports sinus congestion as above Cardiovascular: Negative for chest pain. Respiratory: Positive for shortness of breath and cough. Gastrointestinal: Negative for abdominal pain, vomiting and diarrhea. Genitourinary: Negative for dysuria. Musculoskeletal: Negative for back pain. Skin: Negative for rash. Neurological: Negative for headaches, focal weakness or numbness. ____________________________________________  PHYSICAL EXAM:  VITAL SIGNS: ED Triage Vitals [06/17/18 1111]  Enc Vitals Group     BP (!) 136/98     Pulse Rate 91  Resp 16     Temp 98.2 F (36.8 C)     Temp Source Oral     SpO2 96 %     Weight      Height      Head Circumference      Peak Flow      Pain Score 7     Pain Loc      Pain Edu?      Excl. in GC?     Constitutional: Alert and oriented. Well appearing and in no distress. Head: Normocephalic and atraumatic. Eyes: Conjunctivae are normal. PERRL. Normal extraocular movements Ears: Canals clear. TMs intact  bilaterally.  Right ear with a mild serous effusion noted. Nose: No congestion/rhinorrhea/epistaxis. Mouth/Throat: Mucous membranes are moist.  Uvula is midline and tonsils are flat.  No oropharyngeal lesions appreciated. Neck: Supple.  Normal range motion without crepitus or rigidity. Hematological/Lymphatic/Immunological: No cervical lymphadenopathy. Cardiovascular: Normal rate, regular rhythm. Normal distal pulses. Respiratory: Normal respiratory effort. No wheezes/rales/rhonchi. Gastrointestinal: Soft and nontender. No distention. ____________________________________________   RADIOLOGY  CXR  Negative ____________________________________________  PROCEDURES  Procedures ____________________________________________  INITIAL IMPRESSION / ASSESSMENT AND PLAN / ED COURSE  Patient with ED evaluation of a 2-week complaint of cough and congestion, with a recent flu positive contact in her household.  Patient's clinical picture is overall reassuring as it shows no acute respiratory distress or dehydration.  Her x-ray is also negative for any acute infectious process.  Given the patient's prolonged symptoms and recent flu positive contact, we suggest that she prophylax at this time with Tamiflu.  Patient is amenable to the plan, and will be discharged with a prescription for 10-day course of Tamiflu.  She will also be discharged with a prescription for prednisone as well as Tessalon Perles for cough and bronchospasm.  She is encouraged to take over-the-counter allergy medicine, nasal spray, and cough syrup as needed.  She will follow-up with her primary provider or return to the ED as needed. ____________________________________________  FINAL CLINICAL IMPRESSION(S) / ED DIAGNOSES  Final diagnoses:  Viral URI with cough  Exposure to influenza      Lenni Reckner, Charlesetta Ivory, PA-C 06/17/18 1841    Lissa Hoard, PA-C 06/17/18 1842    Sharman Cheek, MD 06/20/18 [phone]

## 2018-06-17 NOTE — ED Triage Notes (Signed)
Pt states her granddaughter was tested + for flu and pneumonia last night. PT states she has symptoms of cough and congestion. NAD noted

## 2018-06-17 NOTE — Discharge Instructions (Addendum)
Your exam and CXR are negative at this time. You will be given prophylaxis for influenza exposure. Take the Tamiflu until all pills are gone. Take the additional medicines for symptom relief. Follow-up with your provider for ongoing symptoms.

## 2018-10-11 ENCOUNTER — Other Ambulatory Visit: Payer: Medicare HMO

## 2018-10-11 ENCOUNTER — Other Ambulatory Visit: Payer: Self-pay

## 2018-10-11 DIAGNOSIS — Z20822 Contact with and (suspected) exposure to covid-19: Secondary | ICD-10-CM

## 2018-10-15 LAB — NOVEL CORONAVIRUS, NAA: SARS-CoV-2, NAA: NOT DETECTED

## 2019-02-10 ENCOUNTER — Other Ambulatory Visit: Payer: Self-pay

## 2019-02-10 DIAGNOSIS — Z20822 Contact with and (suspected) exposure to covid-19: Secondary | ICD-10-CM

## 2019-02-12 LAB — NOVEL CORONAVIRUS, NAA: SARS-CoV-2, NAA: NOT DETECTED

## 2019-02-22 ENCOUNTER — Other Ambulatory Visit: Payer: Self-pay | Admitting: Family Medicine

## 2019-02-22 DIAGNOSIS — Z1231 Encounter for screening mammogram for malignant neoplasm of breast: Secondary | ICD-10-CM

## 2019-03-10 ENCOUNTER — Other Ambulatory Visit: Payer: Self-pay

## 2019-03-10 ENCOUNTER — Encounter: Payer: Self-pay | Admitting: Cardiology

## 2019-03-10 ENCOUNTER — Ambulatory Visit (INDEPENDENT_AMBULATORY_CARE_PROVIDER_SITE_OTHER): Payer: Medicare Other | Admitting: Cardiology

## 2019-03-10 ENCOUNTER — Ambulatory Visit (INDEPENDENT_AMBULATORY_CARE_PROVIDER_SITE_OTHER): Payer: Medicare Other

## 2019-03-10 VITALS — BP 100/80 | HR 95 | Ht 66.0 in | Wt 181.0 lb

## 2019-03-10 DIAGNOSIS — R002 Palpitations: Secondary | ICD-10-CM

## 2019-03-10 DIAGNOSIS — I10 Essential (primary) hypertension: Secondary | ICD-10-CM

## 2019-03-10 NOTE — Progress Notes (Signed)
Cardiology Office Note:    Date:  03/10/2019   ID:  Kristin Mcdowell, DOB March 14, 1961, MRN [id number]  PCP:  [facility], Kristin Mcdowell  Cardiologist:  Kate Sable, MD  Electrophysiologist:  None   Referring MD: Marguerita Merles, MD   Chief Complaint  Patient presents with  . New Patient (Initial Visit)    Palpitations; Meds verbally reviewed with patient.    History of Present Illness:    Kristin Mcdowell is a 58 y.o. female with a hx of hypertension, GERD who presents due to palpitations.  Patient states symptoms of palpitations started about 2 to 3 months ago.  She typically feels her heart racing when she is about to go to bed.  She is typically fine during the day.  Palpitations usually last about 10 minutes.  She denies any chest pain, shortness of breath, edema, presyncope or syncope.  Denies any previous history of heart disease.  Denies drinking energy drinks.  Sparingly drinks coffee when it is cold.  Occasionally drinks sodas.  Past Medical History:  Diagnosis Date  . Depression   . GERD (gastroesophageal reflux disease)   . Hypertension   . Seasonal allergies     Past Surgical History:  Procedure Laterality Date  . ABDOMINAL HYSTERECTOMY    . COLONOSCOPY WITH PROPOFOL N/A 08/30/2015   Procedure: COLONOSCOPY WITH PROPOFOL;  Surgeon: Lucilla Lame, MD;  Location: [place];  Service: Endoscopy;  Laterality: N/A;  . LUMBAR DISC SURGERY      Current Medications: Current Meds  Medication Sig  . benzonatate (TESSALON PERLES) 100 MG capsule Take 1-2 tabs TID prn cough  . cetirizine (ZYRTEC) 10 MG tablet Take 10 mg by mouth daily.  . famotidine (PEPCID) 20 MG tablet Take 1 tablet (20 mg total) by mouth 2 (two) times daily for 15 days. (Patient taking differently: Take 20 mg by mouth 2 (two) times daily as needed. )  . GuaiFENesin (TUSSIN PO) Take by mouth daily as needed.  . hydrochlorothiazide (HYDRODIURIL) 25 MG tablet Take 25 mg by mouth.  . Multiple  Vitamin (MULTIVITAMIN) capsule Take 1 capsule by mouth daily.  . polyethylene glycol (GOLYTELY) 236 g solution Take 4,000 mLs by mouth once. Drink one 8 oz glass every 20 mins until stools are clear.  . sertraline (ZOLOFT) 50 MG tablet Take 50 mg by mouth daily.     Allergies:   Penicillins   Social History   Socioeconomic History  . Marital status: Single    Spouse name: Not on file  . Number of children: Not on file  . Years of education: Not on file  . Highest education level: Not on file  Occupational History  . Not on file  Social Needs  . Financial resource strain: Not on file  . Food insecurity    Worry: Not on file    Inability: Not on file  . Transportation needs    Medical: Not on file    Non-medical: Not on file  Tobacco Use  . Smoking status: Never Smoker  . Smokeless tobacco: Never Used  Substance and Sexual Activity  . Alcohol use: No  . Drug use: Never  . Sexual activity: Not on file  Lifestyle  . Physical activity    Days per week: Not on file    Minutes per session: Not on file  . Stress: Not on file  Relationships  . Social Herbalist on phone: Not on file    Gets  together: Not on file    Attends religious service: Not on file    Active member of club or organization: Not on file    Attends meetings of clubs or organizations: Not on file    Relationship status: Not on file  Other Topics Concern  . Not on file  Social History Narrative  . Not on file     Family History: The patient's family history includes Heart disease in her mother; Stroke in her brother. There is no history of Breast cancer.  ROS:   Please see the history of present illness.     All other systems reviewed and are negative.  EKGs/Labs/Other Studies Reviewed:    The following studies were reviewed today:   EKG:  EKG is  ordered today.  The ekg ordered today demonstrates normal sinus rhythm, normal ECG  Recent Labs: No results found for requested labs  within last 8760 hours.  Recent Lipid Panel No results found for: CHOL, TRIG, HDL, CHOLHDL, VLDL, LDLCALC, LDLDIRECT  Physical Exam:    VS:  BP 100/80 (BP Location: Right Arm, Patient Position: Sitting, Cuff Size: Normal)   Pulse 95   Ht 5\' 6"  (1.676 m)   Wt 181 lb (82.1 kg)   SpO2 98%   BMI 29.21 kg/m     Wt Readings from Last 3 Encounters:  03/10/19 181 lb (82.1 kg)  03/14/18 182 lb (82.6 kg)  03/11/18 182 lb (82.6 kg)     GEN:  Well nourished, well developed in no acute distress HEENT: Normal NECK: No JVD; No carotid bruits LYMPHATICS: No lymphadenopathy CARDIAC: RRR, no murmurs, rubs, gallops RESPIRATORY:  Clear to auscultation without rales, wheezing or rhonchi  ABDOMEN: Soft, non-tender, non-distended MUSCULOSKELETAL:  No edema; No deformity  SKIN: Warm and dry NEUROLOGIC:  Alert and oriented x 3 PSYCHIATRIC:  Normal affect   ASSESSMENT:   Patient with palpitations over the past month.  No symptoms of dizziness or syncope. 1. Palpitations   2. Essential hypertension    PLAN:    In order of problems listed above:  1. Cardiac monitor x2 weeks. 2. Blood pressure well controlled.  Continue HCTZ 25 mg daily.  Follow-up after cardiac monitor in a month.   Medication Adjustments/Labs and Tests Ordered: Current medicines are reviewed at length with the patient today.  Concerns regarding medicines are outlined above.  Orders Placed This Encounter  Procedures  . LONG TERM MONITOR (3-14 DAYS)  . EKG 12-Lead   No orders of the defined types were placed in this encounter.   Patient Instructions  Medication Instructions:  - Your physician recommends that you continue on your current medications as directed. Please refer to the Current Medication list given to you today.  *If you need a refill on your cardiac medications before your next appointment, please call your pharmacy*  Lab Work: - none ordered  If you have labs (blood work) drawn today and your tests  are completely normal, you will receive your results only by: 14/06/19 MyChart Message (if you have MyChart) OR . A paper copy in the mail If you have any lab test that is abnormal or we need to change your treatment, we will call you to review the results.  Testing/Procedures: - Your physician has recommended that you wear a 14 day Zio monitor (placed in office today). This monitor is a medical device that records the heart's electrical activity. Doctors most often use these monitors to diagnose arrhythmias. Arrhythmias are problems with the speed  or rhythm of the heartbeat. The monitor is a small device applied to your chest. You can wear one while you do your normal daily activities. While wearing this monitor if you have any symptoms to push the button and record what you felt. Once you have worn this monitor for the period of time provider prescribed (Usually 14 days), you will return the monitor device in the postage paid box. Once it is returned they will download the data collected and provide us with a report which the provider will then review and we will call you with those results. Important tips:  1. Avoid showering during the first 24 hours of wearing the monitor. 2. Avoid excessive sweating to help maximize wear time. 3. Do not submerge the device, no hot tubs, and no swimming pools. 4. Keep any lotions or oils away from the patch. 5. After 24 hours you may shower with the patch on. Take brief showers with your back facing the shower head.  6. Do not remove patch once it has been placed because that will interrupt data and decrease adhesive wear time. 7. Push the button when you have any symptoms and write down what you were feeling. 8. Once you have completed wearing your monitor, remove and place into box which has postage paid and place in your outgoing mailbox.  9. If for some reason you have misplaced your box then call our office and we can provide another box and/or mail it off for  you.        Follow-Up: At [facility], you and your health needs are our priority.  As part of our continuing mission to provide you with exceptional heart care, we have created designated Provider Care Teams.  These Care Teams include your primary Cardiologist (physician) and Advanced Practice Providers (APPs -  Physician Assistants and Nurse Practitioners) who all work together to provide you with the care you need, when you need it.  Your next appointment:   1 month(s)  The format for your next appointment:   In Person  Provider:   Debbe OdeaBrian Agbor-Etang, MD  Other Instructions n/a    Signed, Debbe OdeaBrian Agbor-Etang, MD  03/10/2019 2:45 PM    [facility]

## 2019-03-10 NOTE — Patient Instructions (Signed)
Medication Instructions:  - Your physician recommends that you continue on your current medications as directed. Please refer to the Current Medication list given to you today.  *If you need a refill on your cardiac medications before your next appointment, please call your pharmacy*  Lab Work: - none ordered  If you have labs (blood work) drawn today and your tests are completely normal, you will receive your results only by: . MyChart Message (if you have MyChart) OR . A paper copy in the mail If you have any lab test that is abnormal or we need to change your treatment, we will call you to review the results.  Testing/Procedures: - Your physician has recommended that you wear a 14 day Zio monitor (placed in office today). This monitor is a medical device that records the heart's electrical activity. Doctors most often use these monitors to diagnose arrhythmias. Arrhythmias are problems with the speed or rhythm of the heartbeat. The monitor is a small device applied to your chest. You can wear one while you do your normal daily activities. While wearing this monitor if you have any symptoms to push the button and record what you felt. Once you have worn this monitor for the period of time provider prescribed (Usually 14 days), you will return the monitor device in the postage paid box. Once it is returned they will download the data collected and provide us with a report which the provider will then review and we will call you with those results. Important tips:  1. Avoid showering during the first 24 hours of wearing the monitor. 2. Avoid excessive sweating to help maximize wear time. 3. Do not submerge the device, no hot tubs, and no swimming pools. 4. Keep any lotions or oils away from the patch. 5. After 24 hours you may shower with the patch on. Take brief showers with your back facing the shower head.  6. Do not remove patch once it has been placed because that will interrupt data and  decrease adhesive wear time. 7. Push the button when you have any symptoms and write down what you were feeling. 8. Once you have completed wearing your monitor, remove and place into box which has postage paid and place in your outgoing mailbox.  9. If for some reason you have misplaced your box then call our office and we can provide another box and/or mail it off for you.        Follow-Up: At [facility], you and your health needs are our priority.  As part of our continuing mission to provide you with exceptional heart care, we have created designated Provider Care Teams.  These Care Teams include your primary Cardiologist (physician) and Advanced Practice Providers (APPs -  Physician Assistants and Nurse Practitioners) who all work together to provide you with the care you need, when you need it.  Your next appointment:   1 month(s)  The format for your next appointment:   In Person  Provider:   Brian Agbor-Etang, MD  Other Instructions n/a  

## 2019-03-24 DIAGNOSIS — R002 Palpitations: Secondary | ICD-10-CM

## 2019-04-17 ENCOUNTER — Encounter: Payer: Self-pay | Admitting: Cardiology

## 2019-04-17 ENCOUNTER — Other Ambulatory Visit: Payer: Self-pay

## 2019-04-17 ENCOUNTER — Inpatient Hospital Stay: Payer: Medicare Other

## 2019-04-17 ENCOUNTER — Encounter: Payer: Self-pay | Admitting: Oncology

## 2019-04-17 ENCOUNTER — Inpatient Hospital Stay: Payer: Medicare Other | Attending: Oncology | Admitting: Oncology

## 2019-04-17 ENCOUNTER — Ambulatory Visit (INDEPENDENT_AMBULATORY_CARE_PROVIDER_SITE_OTHER): Payer: Medicare Other | Admitting: Cardiology

## 2019-04-17 VITALS — BP 120/80 | HR 87 | Ht 65.0 in | Wt 179.0 lb

## 2019-04-17 VITALS — BP 134/98 | HR 72 | Temp 97.1°F | Ht 65.0 in | Wt 179.4 lb

## 2019-04-17 DIAGNOSIS — I10 Essential (primary) hypertension: Secondary | ICD-10-CM | POA: Diagnosis not present

## 2019-04-17 DIAGNOSIS — E559 Vitamin D deficiency, unspecified: Secondary | ICD-10-CM | POA: Insufficient documentation

## 2019-04-17 DIAGNOSIS — R002 Palpitations: Secondary | ICD-10-CM | POA: Diagnosis not present

## 2019-04-17 DIAGNOSIS — K219 Gastro-esophageal reflux disease without esophagitis: Secondary | ICD-10-CM | POA: Insufficient documentation

## 2019-04-17 DIAGNOSIS — Z79899 Other long term (current) drug therapy: Secondary | ICD-10-CM | POA: Insufficient documentation

## 2019-04-17 DIAGNOSIS — R748 Abnormal levels of other serum enzymes: Secondary | ICD-10-CM

## 2019-04-17 LAB — CBC WITH DIFFERENTIAL/PLATELET
Abs Immature Granulocytes: 0.01 10*3/uL (ref 0.00–0.07)
Basophils Absolute: 0 10*3/uL (ref 0.0–0.1)
Basophils Relative: 0 %
Eosinophils Absolute: 0 10*3/uL (ref 0.0–0.5)
Eosinophils Relative: 1 %
HCT: 37.8 % (ref 36.0–46.0)
Hemoglobin: 11.8 g/dL — ABNORMAL LOW (ref 12.0–15.0)
Immature Granulocytes: 0 %
Lymphocytes Relative: 38 %
Lymphs Abs: 1.6 10*3/uL (ref 0.7–4.0)
MCH: 26 pg (ref 26.0–34.0)
MCHC: 31.2 g/dL (ref 30.0–36.0)
MCV: 83.4 fL (ref 80.0–100.0)
Monocytes Absolute: 0.5 10*3/uL (ref 0.1–1.0)
Monocytes Relative: 12 %
Neutro Abs: 2.1 10*3/uL (ref 1.7–7.7)
Neutrophils Relative %: 49 %
Platelets: 241 10*3/uL (ref 150–400)
RBC: 4.53 MIL/uL (ref 3.87–5.11)
RDW: 13.8 % (ref 11.5–15.5)
WBC: 4.3 10*3/uL (ref 4.0–10.5)
nRBC: 0 % (ref 0.0–0.2)

## 2019-04-17 LAB — COMPREHENSIVE METABOLIC PANEL
ALT: 17 U/L (ref 0–44)
AST: 20 U/L (ref 15–41)
Albumin: 4.3 g/dL (ref 3.5–5.0)
Alkaline Phosphatase: 138 U/L — ABNORMAL HIGH (ref 38–126)
Anion gap: 10 (ref 5–15)
BUN: 14 mg/dL (ref 6–20)
CO2: 27 mmol/L (ref 22–32)
Calcium: 9.7 mg/dL (ref 8.9–10.3)
Chloride: 103 mmol/L (ref 98–111)
Creatinine, Ser: 0.92 mg/dL (ref 0.44–1.00)
GFR calc Af Amer: >60 mL/min (ref >60)
GFR calc non Af Amer: >60 mL/min (ref >60)
Glucose, Bld: 102 mg/dL — ABNORMAL HIGH (ref 70–99)
Potassium: 4.3 mmol/L (ref 3.5–5.1)
Sodium: 140 mmol/L (ref 135–145)
Total Bilirubin: 0.6 mg/dL (ref 0.3–1.2)
Total Protein: 7.8 g/dL (ref 6.5–8.1)

## 2019-04-17 NOTE — Patient Instructions (Signed)
Medication Instructions:  Your physician recommends that you continue on your current medications as directed. Please refer to the Current Medication list given to you today.  *If you need a refill on your cardiac medications before your next appointment, please call your pharmacy*  Lab Work: None ordered If you have labs (blood work) drawn today and your tests are completely normal, you will receive your results only by: . MyChart Message (if you have MyChart) OR . A paper copy in the mail If you have any lab test that is abnormal or we need to change your treatment, we will call you to review the results.  Testing/Procedures: None ordered  Follow-Up: At [facility], you and your health needs are our priority.  As part of our continuing mission to provide you with exceptional heart care, we have created designated Provider Care Teams.  These Care Teams include your primary Cardiologist (physician) and Advanced Practice Providers (APPs -  Physician Assistants and Nurse Practitioners) who all work together to provide you with the care you need, when you need it.  Your next appointment:   4 months  The format for your next appointment:   In Person  Provider:    You may see Brian Agbor-Etang, MD or one of the following Advanced Practice Providers on your designated Care Team:    Christopher Berge, NP  Ryan Dunn, PA-C  Jacquelyn Visser, PA-C   Other Instructions N/A  

## 2019-04-17 NOTE — Progress Notes (Signed)
Patient is here today to establish care for abnormal levels of serum enzymes. Patient stated that she had constipated for the past week she had been straining and had noticed some blood on her stools. Patient had her last colonoscopy on 08/30/2015 by Dr. Servando Snare.

## 2019-04-17 NOTE — Progress Notes (Signed)
Cardiology Office Note:    Date:  04/17/2019   ID:  Kristin Mcdowell, DOB 1960-12-19, MRN [id number]  PCP:  [facility]  Cardiologist:  Debbe Odea, MD  Electrophysiologist:  None   Referring MD: [facility]*   Chief Complaint  Patient presents with  . office visit    1 mo F/U after wearing ZIO monitor; Meds verbally reviewed with patient.    History of Present Illness:    Kristin Mcdowell is a 59 y.o. female with a hx of hypertension, GERD who presents for follow-up.  She was originally seen due to a 2 to 31-month history of palpitations, usually lasting 10 minutes.  She denied other symptoms such as chest pain, shortness of breath, presyncope or syncope.  A 2-week cardiac monitor was ordered.  Now presents for results.  Patient states having occasional palpitations maybe once or twice since the monitor was taken off.  She otherwise feels okay.   Past Medical History:  Diagnosis Date  . Depression   . GERD (gastroesophageal reflux disease)   . Hypertension   . Seasonal allergies     Past Surgical History:  Procedure Laterality Date  . ABDOMINAL HYSTERECTOMY    . COLONOSCOPY WITH PROPOFOL N/A 08/30/2015   Procedure: COLONOSCOPY WITH PROPOFOL;  Surgeon: Midge Minium, MD;  Location: [facility];  Service: Endoscopy;  Laterality: N/A;  . LUMBAR DISC SURGERY      Current Medications: Current Meds  Medication Sig  . acetaminophen (TYLENOL) 325 MG tablet Take 325 mg by mouth as needed.  Marland Kitchen amLODipine (NORVASC) 5 MG tablet Take 5 mg by mouth daily.  . cetirizine (ZYRTEC) 10 MG tablet Take 10 mg by mouth daily.  . COD LIVER OIL PO Take by mouth daily.  . cyclobenzaprine (FLEXERIL) 10 MG tablet Take 10 mg by mouth 2 (two) times daily as needed for muscle spasms.  Marland Kitchen docusate sodium (COLACE) 100 MG capsule Take 100 mg by mouth 2 (two) times daily as needed for mild constipation.  . lidocaine (LIDODERM) 5 % Place 1 patch onto the skin  daily as needed. Remove & Discard patch within 12 hours or as directed by MD  . Melatonin 1 MG TABS Take 5 mg by mouth at bedtime as needed.  . Multiple Vitamin (MULTIVITAMIN) capsule Take 1 capsule by mouth daily.  Marland Kitchen omeprazole (PRILOSEC) 20 MG capsule Take 20 mg by mouth daily.  . sertraline (ZOLOFT) 50 MG tablet Take 50 mg by mouth daily.  . sodium chloride (OCEAN) 0.65 % SOLN nasal spray Place 1 spray into both nostrils as needed for congestion.     Allergies:   Penicillins   Social History   Socioeconomic History  . Marital status: Single    Spouse name: Not on file  . Number of children: Not on file  . Years of education: Not on file  . Highest education level: Not on file  Occupational History  . Not on file  Tobacco Use  . Smoking status: Never Smoker  . Smokeless tobacco: Never Used  Substance and Sexual Activity  . Alcohol use: No  . Drug use: Never  . Sexual activity: Not on file  Other Topics Concern  . Not on file  Social History Narrative  . Not on file   Social Determinants of Health   Financial Resource Strain:   . Difficulty of Paying Living Expenses: Not on file  Food Insecurity:   . Worried About Programme researcher, broadcasting/film/video in  the Last Year: Not on file  . Ran Out of Food in the Last Year: Not on file  Transportation Needs:   . Lack of Transportation (Medical): Not on file  . Lack of Transportation (Non-Medical): Not on file  Physical Activity:   . Days of Exercise per Week: Not on file  . Minutes of Exercise per Session: Not on file  Stress:   . Feeling of Stress : Not on file  Social Connections:   . Frequency of Communication with Friends and Family: Not on file  . Frequency of Social Gatherings with Friends and Family: Not on file  . Attends Religious Services: Not on file  . Active Member of Clubs or Organizations: Not on file  . Attends Archivist Meetings: Not on file  . Marital Status: Not on file     Family History: The patient's  family history includes Heart disease in her mother; Stroke in her brother. There is no history of Breast cancer.  ROS:   Please see the history of present illness.     All other systems reviewed and are negative.  EKGs/Labs/Other Studies Reviewed:    The following studies were reviewed today:  2-week cardiac monitor date 03/10/2019 Patient had a min HR of 48 bpm, max HR of 141 bpm, and avg HR of 76 bpm. Predominant underlying rhythm was Sinus Rhythm. 1 run of Ventricular Tachycardia occurred lasting 8 beats with a max rate of 124 bpm (avg 103 bpm). Isolated SVEs were rare (<1.0%), SVE Couplets were rare (<1.0%), and no SVE Triplets were present. Isolated VEs were rare (<1.0%), and no VE Couplets or VE Triplets were present.  Patient triggered events were associated with sinus rhythm.  EKG:  EKG is  ordered today.  The ekg ordered today demonstrates normal sinus rhythm, normal ECG  Recent Labs: No results found for requested labs within last 8760 hours.  Recent Lipid Panel No results found for: CHOL, TRIG, HDL, CHOLHDL, VLDL, LDLCALC, LDLDIRECT  Physical Exam:    VS:  BP 120/80 (BP Location: Left Arm, Patient Position: Sitting, Cuff Size: Normal)   Pulse 87   Ht 5\' 5"  (1.651 m)   Wt 179 lb (81.2 kg)   SpO2 97%   BMI 29.79 kg/m     Wt Readings from Last 3 Encounters:  04/17/19 179 lb (81.2 kg)  03/10/19 181 lb (82.1 kg)  03/14/18 182 lb (82.6 kg)     GEN:  Well nourished, well developed in no acute distress HEENT: Normal NECK: No JVD; No carotid bruits LYMPHATICS: No lymphadenopathy CARDIAC: RRR, no murmurs, rubs, gallops RESPIRATORY:  Clear to auscultation without rales, wheezing or rhonchi  ABDOMEN: Soft, non-tender, non-distended MUSCULOSKELETAL:  No edema; No deformity  SKIN: Warm and dry NEUROLOGIC:  Alert and oriented x 3 PSYCHIATRIC:  Normal affect   ASSESSMENT:   Patient with history of palpitations.  Cardiac monitor with no significant arrhythmias.  One  episode of nonsustained VT lasting 8 beats noted.   1. Palpitations   2. Essential hypertension    PLAN:    In order of problems listed above:  1. Monitor for now.  No significant arrhythmias noted on cardiac monitor.  Patient made aware of results.  Option of starting low-dose beta-blocker was presented with patient will like to wait for now.  If symptoms persist or become worse, beta-blocker care will be considered.. 2. Blood pressure well controlled.  Continue amlodipine 5 mg daily.  Follow-up after cardiac monitor in a month.  This note was generated in part or whole with voice recognition software. Voice recognition is usually quite accurate but there are transcription errors that can and very often do occur. I apologize for any typographical errors that were not detected and corrected.  Medication Adjustments/Labs and Tests Ordered: Current medicines are reviewed at length with the patient today.  Concerns regarding medicines are outlined above.  Orders Placed This Encounter  Procedures  . EKG 12-Lead   No orders of the defined types were placed in this encounter.    Patient Instructions  Medication Instructions:  Your physician recommends that you continue on your current medications as directed. Please refer to the Current Medication list given to you today.  *If you need a refill on your cardiac medications before your next appointment, please call your pharmacy*  Lab Work: None ordered If you have labs (blood work) drawn today and your tests are completely normal, you will receive your results only by: Marland Kitchen MyChart Message (if you have MyChart) OR . A paper copy in the mail If you have any lab test that is abnormal or we need to change your treatment, we will call you to review the results.  Testing/Procedures: None ordered  Follow-Up: At [facility], you and your health needs are our priority.  As part of our continuing mission to provide you with exceptional  heart care, we have created designated Provider Care Teams.  These Care Teams include your primary Cardiologist (physician) and Advanced Practice Providers (APPs -  Physician Assistants and Nurse Practitioners) who all work together to provide you with the care you need, when you need it.  Your next appointment:   4 month(s)  The format for your next appointment:   In Person  Provider:    You may see Debbe Odea, MD or one of the following Advanced Practice Providers on your designated Care Team:    Nicolasa Ducking, NP  Eula Listen, PA-C  Marisue Ivan, PA-C   Other Instructions N/A     Signed, Debbe Odea, MD  04/17/2019 11:48 AM    [facility]

## 2019-04-18 LAB — PTH, INTACT AND CALCIUM
Calcium, Total (PTH): 10.3 mg/dL — ABNORMAL HIGH (ref 8.7–10.2)
PTH: 32 pg/mL (ref 15–65)

## 2019-04-19 ENCOUNTER — Encounter: Payer: Self-pay | Admitting: Oncology

## 2019-04-19 NOTE — Progress Notes (Signed)
Hematology/Oncology Consult note [facility] Telephone:([phone] [phone]  Patient Care Team: Marguerita Merles, MD as PCP - General (Family Medicine) Kate Sable, MD as PCP - Cardiology (Cardiology)   Name of the patient: Kristin Mcdowell  [id number]  December 07, 1960    Reason for referral- elevated alkaline phosphatase   Referring physician- Dr. Delight Stare  Date of visit: 04/19/19   History of presenting illness- Patient is a 59 yr old female with past medical history of GERD depression has been referred for elevated alkaline phosphatase level. Recent alk phos on 04/06/18 elevated at 156. AST, ALT, GGT normal. TSH normal. Vit D level low at 28. Patient reports some fatigue but denies other complaints  ECOG PS- 0  Pain scale- 0   Review of systems- Review of Systems  Constitutional: Negative for chills, fever, malaise/fatigue and weight loss.  HENT: Negative for congestion, ear discharge and nosebleeds.   Eyes: Negative for blurred vision.  Respiratory: Negative for cough, hemoptysis, sputum production, shortness of breath and wheezing.   Cardiovascular: Negative for chest pain, palpitations, orthopnea and claudication.  Gastrointestinal: Negative for abdominal pain, blood in stool, constipation, diarrhea, heartburn, melena, nausea and vomiting.  Genitourinary: Negative for dysuria, flank pain, frequency, hematuria and urgency.  Musculoskeletal: Negative for back pain, joint pain and myalgias.  Skin: Negative for rash.  Neurological: Negative for dizziness, tingling, focal weakness, seizures, weakness and headaches.  Endo/Heme/Allergies: Does not bruise/bleed easily.  Psychiatric/Behavioral: Negative for depression and suicidal ideas. The patient does not have insomnia.     Allergies  Allergen Reactions  . Penicillins     By test Other reaction(s): Other (See Comments) By test    Patient Active Problem List   Diagnosis Date Noted    . Special screening for malignant neoplasms, colon      Past Medical History:  Diagnosis Date  . Depression   . GERD (gastroesophageal reflux disease)   . Hypertension   . Seasonal allergies      Past Surgical History:  Procedure Laterality Date  . ABDOMINAL HYSTERECTOMY    . COLONOSCOPY WITH PROPOFOL N/A 08/30/2015   Procedure: COLONOSCOPY WITH PROPOFOL;  Surgeon: Lucilla Lame, MD;  Location: [place];  Service: Endoscopy;  Laterality: N/A;  . LUMBAR DISC SURGERY      Social History   Socioeconomic History  . Marital status: Single    Spouse name: Not on file  . Number of children: Not on file  . Years of education: Not on file  . Highest education level: Not on file  Occupational History  . Not on file  Tobacco Use  . Smoking status: Never Smoker  . Smokeless tobacco: Never Used  Substance and Sexual Activity  . Alcohol use: No  . Drug use: Never  . Sexual activity: Not on file  Other Topics Concern  . Not on file  Social History Narrative  . Not on file   Social Determinants of Health   Financial Resource Strain:   . Difficulty of Paying Living Expenses: Not on file  Food Insecurity:   . Worried About Charity fundraiser in the Last Year: Not on file  . Ran Out of Food in the Last Year: Not on file  Transportation Needs:   . Lack of Transportation (Medical): Not on file  . Lack of Transportation (Non-Medical): Not on file  Physical Activity:   . Days of Exercise per Week: Not on file  . Minutes of Exercise per Session: Not  on file  Stress:   . Feeling of Stress : Not on file  Social Connections:   . Frequency of Communication with Friends and Family: Not on file  . Frequency of Social Gatherings with Friends and Family: Not on file  . Attends Religious Services: Not on file  . Active Member of Clubs or Organizations: Not on file  . Attends Archivist Meetings: Not on file  . Marital Status: Not on file  Intimate Partner  Violence:   . Fear of Current or Ex-Partner: Not on file  . Emotionally Abused: Not on file  . Physically Abused: Not on file  . Sexually Abused: Not on file     Family History  Problem Relation Age of Onset  . Heart disease Mother   . Stroke Brother   . Breast cancer Neg Hx      Current Outpatient Medications:  .  acetaminophen (TYLENOL) 325 MG tablet, Take 325 mg by mouth as needed., Disp: , Rfl:  .  amLODipine (NORVASC) 5 MG tablet, Take 5 mg by mouth daily., Disp: , Rfl:  .  COD LIVER OIL PO, Take by mouth daily., Disp: , Rfl:  .  cyclobenzaprine (FLEXERIL) 10 MG tablet, Take 10 mg by mouth 2 (two) times daily as needed for muscle spasms., Disp: , Rfl:  .  docusate sodium (COLACE) 100 MG capsule, Take 100 mg by mouth 2 (two) times daily as needed for mild constipation., Disp: , Rfl:  .  lidocaine (LIDODERM) 5 %, Place 1 patch onto the skin daily as needed. Remove & Discard patch within 12 hours or as directed by MD, Disp: , Rfl:  .  Melatonin 1 MG TABS, Take 5 mg by mouth at bedtime as needed., Disp: , Rfl:  .  Multiple Vitamin (MULTIVITAMIN) capsule, Take 1 capsule by mouth daily., Disp: , Rfl:  .  omeprazole (PRILOSEC) 20 MG capsule, Take 20 mg by mouth daily., Disp: , Rfl:  .  sodium chloride (OCEAN) 0.65 % SOLN nasal spray, Place 1 spray into both nostrils as needed for congestion., Disp: , Rfl:  .  benzonatate (TESSALON PERLES) 100 MG capsule, Take 1-2 tabs TID prn cough (Patient not taking: Reported on 04/17/2019), Disp: 30 capsule, Rfl: 0   Physical exam:  Vitals:   04/17/19 1350  BP: (!) 134/98  Pulse: 72  Temp: (!) 97.1 F (36.2 C)  TempSrc: Tympanic  Weight: 179 lb 6.4 oz (81.4 kg)  Height: '5\' 5"'  (1.651 m)   Physical Exam HENT:     Head: Normocephalic and atraumatic.  Eyes:     Pupils: Pupils are equal, round, and reactive to light.  Cardiovascular:     Rate and Rhythm: Normal rate and regular rhythm.     Heart sounds: Normal heart sounds.  Pulmonary:      Effort: Pulmonary effort is normal.     Breath sounds: Normal breath sounds.  Abdominal:     General: Bowel sounds are normal.     Palpations: Abdomen is soft.  Musculoskeletal:     Cervical back: Normal range of motion.  Lymphadenopathy:     Comments: No palpable cervical, supraclavicular, axillary or inguinal adenopathy   Skin:    General: Skin is warm and dry.  Neurological:     Mental Status: She is alert and oriented to person, place, and time.        CMP Latest Ref Rng & Units 04/17/2019  Glucose 70 - 99 mg/dL 102(H)  BUN 6 -  20 mg/dL 14  Creatinine 0.44 - 1.00 mg/dL 0.92  Sodium 135 - 145 mmol/L 140  Potassium 3.5 - 5.1 mmol/L 4.3  Chloride 98 - 111 mmol/L 103  CO2 22 - 32 mmol/L 27  Calcium 8.7 - 10.2 mg/dL 9.7  Total Protein 6.5 - 8.1 g/dL 7.8  Total Bilirubin 0.3 - 1.2 mg/dL 0.6  Alkaline Phos 38 - 126 U/L 138(H)  AST 15 - 41 U/L 20  ALT 0 - 44 U/L 17   CBC Latest Ref Rng & Units 04/17/2019  WBC 4.0 - 10.5 K/uL 4.3  Hemoglobin 12.0 - 15.0 g/dL 11.8(L)  Hematocrit 36.0 - 46.0 % 37.8  Platelets 150 - 400 K/uL 241     Assessment and plan- Patient is a 59 y.o. female referred for elevated alkaline phosphatase  Patient noted to have mildly elevated alk phos <4 times upper limit of normal. Normal AST, ALT, GGT typically suggests this is not liver cause.  Alkaline phosphatase can be elevated in endocrine causes. Patient found to have vit D deficiency which could be the cause as well. Extra hepatic tumors causing elevated alkaline phosphatase is very rare. Clinically no evidence of adenopathy or signs/ symptoms suggestive of malignancy.  I will check fasting alk phos and fractionated alk phos levels along with PTH levels. I will see her for video visit in 2 weeks. Given the mild elevation of alk phos, I am inclined to monitor this conservatively and see how the levels are after Vit D replacement. Ct abdomen could be considered if there is consistent rise in her  levels   Thank you for this kind referral and the opportunity to participate in the care of this  Patient   Visit Diagnosis 1. Elevated alkaline phosphatase level     Dr. Randa Evens, MD, MPH [facility] at [facility] [phone] 04/19/2019  10:03 AM

## 2019-04-21 LAB — ALKALINE PHOSPHATASE, ISOENZYMES
Alk Phos Bone Fract: 25 % (ref 14–68)
Alk Phos Liver Fract: 75 % (ref 18–85)
Alk Phos: 164 IU/L — ABNORMAL HIGH (ref 39–117)
Intestinal %: 0 % (ref 0–18)

## 2019-04-24 ENCOUNTER — Other Ambulatory Visit: Payer: Self-pay

## 2019-04-24 ENCOUNTER — Inpatient Hospital Stay (HOSPITAL_BASED_OUTPATIENT_CLINIC_OR_DEPARTMENT_OTHER): Payer: Medicare Other | Admitting: Oncology

## 2019-04-24 ENCOUNTER — Encounter: Payer: Self-pay | Admitting: Oncology

## 2019-04-24 DIAGNOSIS — E559 Vitamin D deficiency, unspecified: Secondary | ICD-10-CM | POA: Diagnosis not present

## 2019-04-24 DIAGNOSIS — R748 Abnormal levels of other serum enzymes: Secondary | ICD-10-CM

## 2019-04-24 NOTE — Progress Notes (Signed)
Patient stated that she suffers from constipation and that there are times that she notices some blood on her tissue but not in the toilet.

## 2019-04-26 ENCOUNTER — Ambulatory Visit: Payer: Medicare Other | Attending: Internal Medicine

## 2019-04-26 ENCOUNTER — Other Ambulatory Visit: Payer: Self-pay

## 2019-04-26 DIAGNOSIS — Z20822 Contact with and (suspected) exposure to covid-19: Secondary | ICD-10-CM

## 2019-04-26 NOTE — Progress Notes (Signed)
I connected with Kristin Mcdowell on 04/26/19 at  2:15 PM EST by video enabled telemedicine visit and verified that I am speaking with the correct person using two identifiers.   I discussed the limitations, risks, security and privacy concerns of performing an evaluation and management service by telemedicine and the availability of in-person appointments. I also discussed with the patient that there may be a patient responsible charge related to this service. The patient expressed understanding and agreed to proceed.  Other persons participating in the visit and their role in the encounter:  none  Patient's location:  home Provider's location:  work  Risk analyst Complaint:  Discuss results of bloodwork  Diagnosis: elevated alkaline phosphatase possibly due to vit D deficiency  History of present illness: Patient is a 59 yr old female with past medical history of GERD depression has been referred for elevated alkaline phosphatase level. Recent alk phos on 04/06/18 elevated at 156. AST, ALT, GGT normal. TSH normal. Vit D level low at 28.  bloodwork from 04/17/19 showed alk phos elevated at 173. Fractionated alk phos was normal. Calcium mildly elevated at 10.3 and PTH normal at 32.   Interval history no new complaints today   Review of Systems  Constitutional: Negative for chills, fever, malaise/fatigue and weight loss.  HENT: Negative for congestion, ear discharge and nosebleeds.   Eyes: Negative for blurred vision.  Respiratory: Negative for cough, hemoptysis, sputum production, shortness of breath and wheezing.   Cardiovascular: Negative for chest pain, palpitations, orthopnea and claudication.  Gastrointestinal: Negative for abdominal pain, blood in stool, constipation, diarrhea, heartburn, melena, nausea and vomiting.  Genitourinary: Negative for dysuria, flank pain, frequency, hematuria and urgency.  Musculoskeletal: Negative for back pain, joint pain and myalgias.  Skin: Negative for rash.   Neurological: Negative for dizziness, tingling, focal weakness, seizures, weakness and headaches.  Endo/Heme/Allergies: Does not bruise/bleed easily.  Psychiatric/Behavioral: Negative for depression and suicidal ideas. The patient does not have insomnia.     Allergies  Allergen Reactions  . Penicillins     By test Other reaction(s): Other (See Comments) By test    Past Medical History:  Diagnosis Date  . Depression   . GERD (gastroesophageal reflux disease)   . Hypertension   . Seasonal allergies     Past Surgical History:  Procedure Laterality Date  . ABDOMINAL HYSTERECTOMY    . COLONOSCOPY WITH PROPOFOL N/A 08/30/2015   Procedure: COLONOSCOPY WITH PROPOFOL;  Surgeon: Lucilla Lame, MD;  Location: [facility];  Service: Endoscopy;  Laterality: N/A;  . LUMBAR DISC SURGERY      Social History   Socioeconomic History  . Marital status: Single    Spouse name: Not on file  . Number of children: Not on file  . Years of education: Not on file  . Highest education level: Not on file  Occupational History  . Not on file  Tobacco Use  . Smoking status: Never Smoker  . Smokeless tobacco: Never Used  Substance and Sexual Activity  . Alcohol use: No  . Drug use: Never  . Sexual activity: Not on file  Other Topics Concern  . Not on file  Social History Narrative  . Not on file   Social Determinants of Health   Financial Resource Strain:   . Difficulty of Paying Living Expenses: Not on file  Food Insecurity:   . Worried About Charity fundraiser in the Last Year: Not on file  . Ran Out of Food in the Last Year:  Not on file  Transportation Needs:   . Lack of Transportation (Medical): Not on file  . Lack of Transportation (Non-Medical): Not on file  Physical Activity:   . Days of Exercise per Week: Not on file  . Minutes of Exercise per Session: Not on file  Stress:   . Feeling of Stress : Not on file  Social Connections:   . Frequency of Communication with  Friends and Family: Not on file  . Frequency of Social Gatherings with Friends and Family: Not on file  . Attends Religious Services: Not on file  . Active Member of Clubs or Organizations: Not on file  . Attends Archivist Meetings: Not on file  . Marital Status: Not on file  Intimate Partner Violence:   . Fear of Current or Ex-Partner: Not on file  . Emotionally Abused: Not on file  . Physically Abused: Not on file  . Sexually Abused: Not on file    Family History  Problem Relation Age of Onset  . Heart disease Mother   . Stroke Brother   . Breast cancer Neg Hx      Current Outpatient Medications:  .  amLODipine (NORVASC) 5 MG tablet, Take 5 mg by mouth daily., Disp: , Rfl:  .  COD LIVER OIL PO, Take by mouth daily., Disp: , Rfl:  .  cyclobenzaprine (FLEXERIL) 10 MG tablet, Take 10 mg by mouth 2 (two) times daily as needed for muscle spasms., Disp: , Rfl:  .  docusate sodium (COLACE) 100 MG capsule, Take 100 mg by mouth 2 (two) times daily as needed for mild constipation., Disp: , Rfl:  .  lidocaine (LIDODERM) 5 %, Place 1 patch onto the skin daily as needed. Remove & Discard patch within 12 hours or as directed by MD, Disp: , Rfl:  .  Melatonin 1 MG TABS, Take 5 mg by mouth at bedtime as needed., Disp: , Rfl:  .  Multiple Vitamin (MULTIVITAMIN) capsule, Take 1 capsule by mouth daily., Disp: , Rfl:  .  omeprazole (PRILOSEC) 20 MG capsule, Take 20 mg by mouth daily., Disp: , Rfl:  .  sodium chloride (OCEAN) 0.65 % SOLN nasal spray, Place 1 spray into both nostrils as needed for congestion., Disp: , Rfl:  .  acetaminophen (TYLENOL) 325 MG tablet, Take 325 mg by mouth as needed., Disp: , Rfl:  .  benzonatate (TESSALON PERLES) 100 MG capsule, Take 1-2 tabs TID prn cough (Patient not taking: Reported on 04/24/2019), Disp: 30 capsule, Rfl: 0  No results found.  No images are attached to the encounter.   CMP Latest Ref Rng & Units 04/17/2019  Glucose 70 - 99 mg/dL 102(H)   BUN 6 - 20 mg/dL 14  Creatinine 0.44 - 1.00 mg/dL 0.92  Sodium 135 - 145 mmol/L 140  Potassium 3.5 - 5.1 mmol/L 4.3  Chloride 98 - 111 mmol/L 103  CO2 22 - 32 mmol/L 27  Calcium 8.7 - 10.2 mg/dL 9.7  Total Protein 6.5 - 8.1 g/dL 7.8  Total Bilirubin 0.3 - 1.2 mg/dL 0.6  Alkaline Phos 38 - 126 U/L 138(H)  AST 15 - 41 U/L 20  ALT 0 - 44 U/L 17   CBC Latest Ref Rng & Units 04/17/2019  WBC 4.0 - 10.5 K/uL 4.3  Hemoglobin 12.0 - 15.0 g/dL 11.8(L)  Hematocrit 36.0 - 46.0 % 37.8  Platelets 150 - 400 K/uL 241     Observation/objective:appears in no acute distress over video visit today.  Assessment and plan:Patient is a 59 yr old female referred for elevated alkaline phosphatase  I have reviewed patients labs done on 04/17/19. Fractionated plk phos is normal. I suspect mildly elevated alk phos is likely due to vitamin D deficiency. Patient is currently on Vit D supplements. I am inclined to give it some time to work and repeat levels in 3 months. If alkaline phosphatase keeps increasing, I will consider doing CT abdomen to rule out extra hepatic tumors which are very rare causes of elevated alkaline phosphatase  Follow-up instructions: as above  I discussed the assessment and treatment plan with the patient. The patient was provided an opportunity to ask questions and all were answered. The patient agreed with the plan and demonstrated an understanding of the instructions.   The patient was advised to call back or seek an in-person evaluation if the symptoms worsen or if the condition fails to improve as anticipated.   Visit Diagnosis: 1. Elevated alkaline phosphatase level     Dr. Randa Evens, MD, MPH [facility] Tel- [phone] 04/26/2019 8:29 AM

## 2019-04-27 LAB — NOVEL CORONAVIRUS, NAA: SARS-CoV-2, NAA: NOT DETECTED

## 2019-05-02 ENCOUNTER — Emergency Department
Admission: EM | Admit: 2019-05-02 | Discharge: 2019-05-02 | Disposition: A | Discharge status: Home / Self Care | Payer: Medicare Other | Attending: Emergency Medicine | Admitting: Emergency Medicine

## 2019-05-02 ENCOUNTER — Emergency Department: Payer: Medicare Other

## 2019-05-02 ENCOUNTER — Other Ambulatory Visit: Payer: Self-pay

## 2019-05-02 ENCOUNTER — Encounter: Payer: Self-pay | Admitting: Emergency Medicine

## 2019-05-02 DIAGNOSIS — R05 Cough: Secondary | ICD-10-CM | POA: Insufficient documentation

## 2019-05-02 DIAGNOSIS — J189 Pneumonia, unspecified organism: Secondary | ICD-10-CM | POA: Diagnosis not present

## 2019-05-02 DIAGNOSIS — R109 Unspecified abdominal pain: Secondary | ICD-10-CM | POA: Diagnosis present

## 2019-05-02 DIAGNOSIS — K5900 Constipation, unspecified: Secondary | ICD-10-CM | POA: Insufficient documentation

## 2019-05-02 DIAGNOSIS — Z79899 Other long term (current) drug therapy: Secondary | ICD-10-CM | POA: Diagnosis not present

## 2019-05-02 DIAGNOSIS — R110 Nausea: Secondary | ICD-10-CM | POA: Insufficient documentation

## 2019-05-02 DIAGNOSIS — I10 Essential (primary) hypertension: Secondary | ICD-10-CM | POA: Insufficient documentation

## 2019-05-02 DIAGNOSIS — U071 COVID-19: Secondary | ICD-10-CM | POA: Diagnosis not present

## 2019-05-02 LAB — COMPREHENSIVE METABOLIC PANEL
ALT: 16 U/L (ref 0–44)
AST: 22 U/L (ref 15–41)
Albumin: 4 g/dL (ref 3.5–5.0)
Alkaline Phosphatase: 131 U/L — ABNORMAL HIGH (ref 38–126)
Anion gap: 8 (ref 5–15)
BUN: 14 mg/dL (ref 6–20)
CO2: 27 mmol/L (ref 22–32)
Calcium: 9.4 mg/dL (ref 8.9–10.3)
Chloride: 104 mmol/L (ref 98–111)
Creatinine, Ser: 0.85 mg/dL (ref 0.44–1.00)
GFR calc Af Amer: >60 mL/min (ref >60)
GFR calc non Af Amer: >60 mL/min (ref >60)
Glucose, Bld: 103 mg/dL — ABNORMAL HIGH (ref 70–99)
Potassium: 4 mmol/L (ref 3.5–5.1)
Sodium: 139 mmol/L (ref 135–145)
Total Bilirubin: 0.7 mg/dL (ref 0.3–1.2)
Total Protein: 7.8 g/dL (ref 6.5–8.1)

## 2019-05-02 LAB — CBC WITH DIFFERENTIAL/PLATELET
Abs Immature Granulocytes: 0.01 10*3/uL (ref 0.00–0.07)
Basophils Absolute: 0 10*3/uL (ref 0.0–0.1)
Basophils Relative: 0 %
Eosinophils Absolute: 0 10*3/uL (ref 0.0–0.5)
Eosinophils Relative: 0 %
HCT: 37.9 % (ref 36.0–46.0)
Hemoglobin: 11.9 g/dL — ABNORMAL LOW (ref 12.0–15.0)
Immature Granulocytes: 0 %
Lymphocytes Relative: 36 %
Lymphs Abs: 1.4 10*3/uL (ref 0.7–4.0)
MCH: 25.6 pg — ABNORMAL LOW (ref 26.0–34.0)
MCHC: 31.4 g/dL (ref 30.0–36.0)
MCV: 81.7 fL (ref 80.0–100.0)
Monocytes Absolute: 1 10*3/uL (ref 0.1–1.0)
Monocytes Relative: 28 %
Neutro Abs: 1.3 10*3/uL — ABNORMAL LOW (ref 1.7–7.7)
Neutrophils Relative %: 36 %
Platelets: 211 10*3/uL (ref 150–400)
RBC: 4.64 MIL/uL (ref 3.87–5.11)
RDW: 13.8 % (ref 11.5–15.5)
WBC: 3.8 10*3/uL — ABNORMAL LOW (ref 4.0–10.5)
nRBC: 0 % (ref 0.0–0.2)

## 2019-05-02 LAB — URINALYSIS, COMPLETE (UACMP) WITH MICROSCOPIC
Bacteria, UA: NONE SEEN
Bilirubin Urine: NEGATIVE
Glucose, UA: NEGATIVE mg/dL
Ketones, ur: NEGATIVE mg/dL
Nitrite: NEGATIVE
Protein, ur: NEGATIVE mg/dL
Specific Gravity, Urine: 1.016 (ref 1.005–1.030)
Squamous Epithelial / HPF: NONE SEEN (ref 0–5)
pH: 5 (ref 5.0–8.0)

## 2019-05-02 LAB — CBC
HCT: 37.3 % (ref 36.0–46.0)
Hemoglobin: 11.9 g/dL — ABNORMAL LOW (ref 12.0–15.0)
MCH: 25.9 pg — ABNORMAL LOW (ref 26.0–34.0)
MCHC: 31.9 g/dL (ref 30.0–36.0)
MCV: 81.1 fL (ref 80.0–100.0)
Platelets: 212 10*3/uL (ref 150–400)
RBC: 4.6 MIL/uL (ref 3.87–5.11)
RDW: 13.7 % (ref 11.5–15.5)
WBC: 3.5 10*3/uL — ABNORMAL LOW (ref 4.0–10.5)
nRBC: 0 % (ref 0.0–0.2)

## 2019-05-02 LAB — POCT PREGNANCY, URINE: Preg Test, Ur: NEGATIVE

## 2019-05-02 LAB — LIPASE, BLOOD: Lipase: 19 U/L (ref 11–51)

## 2019-05-02 MED ORDER — MORPHINE SULFATE (PF) 4 MG/ML IV SOLN
INTRAVENOUS | Status: AC
  Filled 2019-05-02: qty 1

## 2019-05-02 MED ORDER — SODIUM CHLORIDE 0.9 % IV SOLN
2.0000 g | Freq: Once | INTRAVENOUS | Status: AC
  Administered 2019-05-02: 2 g via INTRAVENOUS
  Filled 2019-05-02: qty 20

## 2019-05-02 MED ORDER — SODIUM CHLORIDE 0.9 % IV BOLUS
1000.0000 mL | Freq: Once | INTRAVENOUS | Status: AC
  Administered 2019-05-02: 14:00:00 1000 mL via INTRAVENOUS

## 2019-05-02 MED ORDER — SODIUM CHLORIDE 0.9% FLUSH: 3.0000 mL | Freq: Once | INTRAVENOUS | Status: DC

## 2019-05-02 MED ORDER — DEXAMETHASONE SODIUM PHOSPHATE 10 MG/ML IJ SOLN
10.0000 mg | Freq: Once | INTRAMUSCULAR | Status: AC
  Administered 2019-05-02: 10 mg via INTRAVENOUS
  Filled 2019-05-02: qty 1

## 2019-05-02 MED ORDER — HYDROCODONE-HOMATROPINE 5-1.5 MG/5ML PO SYRP: 5.0000 mL | ORAL_SOLUTION | Freq: Four times a day (QID) | ORAL | 0 refills | Status: DC | PRN

## 2019-05-02 MED ORDER — ONDANSETRON HCL 4 MG/2ML IJ SOLN
4.0000 mg | Freq: Once | INTRAMUSCULAR | Status: AC
  Administered 2019-05-02: 14:00:00 4 mg via INTRAVENOUS
  Filled 2019-05-02: qty 2

## 2019-05-02 MED ORDER — ONDANSETRON HCL 4 MG/2ML IJ SOLN
INTRAMUSCULAR | Status: AC
  Filled 2019-05-02: qty 2

## 2019-05-02 MED ORDER — MORPHINE SULFATE (PF) 2 MG/ML IV SOLN
INTRAVENOUS | Status: AC
  Filled 2019-05-02: qty 1

## 2019-05-02 MED ORDER — DOXYCYCLINE HYCLATE 100 MG PO CAPS: 100.0000 mg | ORAL_CAPSULE | Freq: Two times a day (BID) | ORAL | 0 refills | Status: AC

## 2019-05-02 MED ORDER — CEFDINIR 300 MG PO CAPS: 300.0000 mg | ORAL_CAPSULE | Freq: Two times a day (BID) | ORAL | 0 refills | Status: AC

## 2019-05-02 MED ORDER — SODIUM CHLORIDE 0.9 % IV SOLN
500.0000 mg | Freq: Once | INTRAVENOUS | Status: AC
  Administered 2019-05-02: 15:00:00 500 mg via INTRAVENOUS
  Filled 2019-05-02: qty 500

## 2019-05-02 MED ORDER — IOHEXOL 300 MG/ML  SOLN
100.0000 mL | Freq: Once | INTRAMUSCULAR | Status: AC | PRN
  Administered 2019-05-02: 14:00:00 100 mL via INTRAVENOUS

## 2019-05-02 MED ORDER — MORPHINE SULFATE (PF) 4 MG/ML IV SOLN
6.0000 mg | Freq: Once | INTRAVENOUS | Status: AC
  Administered 2019-05-02: 14:00:00 6 mg via INTRAVENOUS
  Filled 2019-05-02: qty 2

## 2019-05-02 NOTE — Discharge Instructions (Signed)
Take both antibiotics as prescribed  Drink plenty of fluids  Take the cough medicine as needed

## 2019-05-02 NOTE — ED Notes (Signed)
Pt up to bathroom again  Meds infusing.

## 2019-05-02 NOTE — ED Notes (Signed)
Resumed care from val rn.  Pt alert.  Pt up to br with out assistance.  meds given  swab to lab.

## 2019-05-02 NOTE — ED Triage Notes (Signed)
Pt states RLQ abd pain like "muscle spasms" that began Sunday night. Denies N,V,D. Also having right ear pain as well, and dry cough. Negative covid results yesterday. NAD.

## 2019-05-02 NOTE — ED Notes (Signed)
Report off to [place] rn

## 2019-05-02 NOTE — ED Provider Notes (Signed)
[facility] Emergency Department Provider Note  ____________________________________________   First MD Initiated Contact with Patient 05/02/19 1217     (approximate)  I have reviewed the triage vital signs and the nursing notes.   HISTORY  Chief Complaint Abdominal Pain    HPI Kristin Mcdowell is a 59 y.o. female  With h/o HTN here with R sided abd pain. Pt reports that for the past 4 days, she's had gradual onset of progressively worsening R sided abd pain. Pain began diffusely and gradually and is now localized primarily on the R. She's had associated nausea, mild constipation though this is not new. No dysuria, hematuria, frequency. No vaginal bleeding or discharge. No fevers. No other complaints. Of note, she has also had a mild cough over past several days. She was COVID testing on 1/20 and was negative. No other complaints.       Past Medical History:  Diagnosis Date  . Depression   . GERD (gastroesophageal reflux disease)   . Hypertension   . Seasonal allergies     Patient Active Problem List   Diagnosis Date Noted  . Special screening for malignant neoplasms, colon     Past Surgical History:  Procedure Laterality Date  . ABDOMINAL HYSTERECTOMY    . COLONOSCOPY WITH PROPOFOL N/A 08/30/2015   Procedure: COLONOSCOPY WITH PROPOFOL;  Surgeon: Midge Minium, MD;  Location: [facility];  Service: Endoscopy;  Laterality: N/A;  . LUMBAR DISC SURGERY      Prior to Admission medications   Medication Sig Start Date End Date Taking? Authorizing Provider  acetaminophen (TYLENOL) 325 MG tablet Take 325 mg by mouth as needed.    [provider]  amLODipine (NORVASC) 5 MG tablet Take 5 mg by mouth daily.    [provider]  benzonatate (TESSALON PERLES) 100 MG capsule Take 1-2 tabs TID prn cough Patient not taking: Reported on 04/24/2019 06/17/18   Menshew, Charlesetta Ivory, PA-C  cefdinir (OMNICEF) 300 MG capsule Take 1 capsule  (300 mg total) by mouth 2 (two) times daily for 7 days. 05/02/19 05/09/19  Shaune Pollack, MD  COD LIVER OIL PO Take by mouth daily.    [provider]  cyclobenzaprine (FLEXERIL) 10 MG tablet Take 10 mg by mouth 2 (two) times daily as needed for muscle spasms.    [provider]  docusate sodium (COLACE) 100 MG capsule Take 100 mg by mouth 2 (two) times daily as needed for mild constipation.    [provider]  doxycycline (VIBRAMYCIN) 100 MG capsule Take 1 capsule (100 mg total) by mouth 2 (two) times daily for 7 days. 05/02/19 05/09/19  Shaune Pollack, MD  HYDROcodone-homatropine [facility]) 5-1.5 MG/5ML syrup Take 5 mLs by mouth every 6 (six) hours as needed for cough. 05/02/19   Shaune Pollack, MD  lidocaine (LIDODERM) 5 % Place 1 patch onto the skin daily as needed. Remove & Discard patch within 12 hours or as directed by MD    [provider]  Melatonin 1 MG TABS Take 5 mg by mouth at bedtime as needed.    [provider]  Multiple Vitamin (MULTIVITAMIN) capsule Take 1 capsule by mouth daily.    [provider]  omeprazole (PRILOSEC) 20 MG capsule Take 20 mg by mouth daily.    [provider]  sodium chloride (OCEAN) 0.65 % SOLN nasal spray Place 1 spray into both nostrils as needed for congestion.    [provider]  Allergies Penicillins  Family History  Problem Relation Age of Onset  . Heart disease Mother   . Stroke Brother   . Breast cancer Neg Hx     Social History Social History   Tobacco Use  . Smoking status: Never Smoker  . Smokeless tobacco: Never Used  Substance Use Topics  . Alcohol use: No  . Drug use: Never    Review of Systems  Review of Systems  Constitutional: Positive for chills, fatigue and fever.  HENT: Positive for congestion. Negative for sore throat.   Eyes: Negative for visual disturbance.  Respiratory: Positive for cough and shortness of breath.   Cardiovascular: Negative for  chest pain.  Gastrointestinal: Positive for nausea. Negative for abdominal pain, diarrhea and vomiting.  Genitourinary: Negative for flank pain.  Musculoskeletal: Positive for arthralgias and back pain. Negative for neck pain.  Skin: Negative for rash and wound.  Neurological: Positive for weakness.  All other systems reviewed and are negative.    ____________________________________________  PHYSICAL EXAM:      VITAL SIGNS: ED Triage Vitals  Enc Vitals Group     BP 05/02/19 1028 125/86     Pulse Rate 05/02/19 1028 73     Resp 05/02/19 1028 18     Temp 05/02/19 1028 99.6 F (37.6 C)     Temp Source 05/02/19 1028 Oral     SpO2 05/02/19 1028 97 %     Weight 05/02/19 1029 178 lb 6.4 oz (80.9 kg)     Height 05/02/19 1029 5\' 5"  (1.651 m)     Head Circumference --      Peak Flow --      Pain Score 05/02/19 1029 8     Pain Loc --      Pain Edu? --      Excl. in GC? --      Physical Exam Vitals and nursing note reviewed.  Constitutional:      General: She is not in acute distress.    Appearance: She is well-developed.  HENT:     Head: Normocephalic and atraumatic.     Mouth/Throat:     Comments: Dry MM Eyes:     Conjunctiva/sclera: Conjunctivae normal.  Cardiovascular:     Rate and Rhythm: Regular rhythm. Tachycardia present.     Heart sounds: Normal heart sounds.  Pulmonary:     Effort: Tachypnea and respiratory distress present.     Breath sounds: Decreased air movement present. Examination of the right-middle field reveals rales. Examination of the left-middle field reveals rales. Examination of the right-lower field reveals rales. Examination of the left-lower field reveals rales. Rales present. No wheezing.  Abdominal:     General: There is no distension.  Musculoskeletal:     Cervical back: Neck supple.  Skin:    General: Skin is warm.     Capillary Refill: Capillary refill takes less than 2 seconds.     Findings: No rash.  Neurological:     Mental Status:  She is alert and oriented to person, place, and time.     Motor: No abnormal muscle tone.       ____________________________________________   LABS (all labs ordered are listed, but only abnormal results are displayed)  Labs Reviewed  COMPREHENSIVE METABOLIC PANEL - Abnormal; Notable for the following components:      Result Value   Glucose, Bld 103 (*)    Alkaline Phosphatase 131 (*)    All other components within normal limits  CBC - Abnormal;  Notable for the following components:   WBC 3.5 (*)    Hemoglobin 11.9 (*)    MCH 25.9 (*)    All other components within normal limits  URINALYSIS, COMPLETE (UACMP) WITH MICROSCOPIC - Abnormal; Notable for the following components:   Color, Urine YELLOW (*)    APPearance CLEAR (*)    Hgb urine dipstick SMALL (*)    Leukocytes,Ua TRACE (*)    All other components within normal limits  CBC WITH DIFFERENTIAL/PLATELET - Abnormal; Notable for the following components:   WBC 3.8 (*)    Hemoglobin 11.9 (*)    MCH 25.6 (*)    Neutro Abs 1.3 (*)    All other components within normal limits  NOVEL CORONAVIRUS, NAA (HOSP ORDER, SEND-OUT TO REF LAB; TAT 18-24 HRS)  LIPASE, BLOOD  POCT PREGNANCY, URINE  POC URINE PREG, ED    ____________________________________________  EKG: Normal sinus rhythm, VR 96. QRS 88, QTc 481. LAD. No acute ST elevations or depressions. ________________________________________  RADIOLOGY All imaging, including plain films, CT scans, and ultrasounds, independently reviewed by me, and interpretations confirmed via formal radiology reads.  ED MD interpretation:   CXR: Clear CT A/P: RLL PNA, no acute intra-abd pathology  Official radiology report(s): DG Chest 2 View  Result Date: 05/02/2019 CLINICAL DATA:  Pain and cough EXAM: CHEST - 2 VIEW COMPARISON:  June 17, 2018 FINDINGS: Lungs are clear. Heart size and pulmonary vascularity are normal. No adenopathy. No pneumothorax. No bone lesions. IMPRESSION: No  edema or consolidation. Cardiac silhouette normal. No adenopathy. Electronically Signed   By: Bretta BangWilliam  Woodruff III M.D.   On: 05/02/2019 14:04   CT ABDOMEN PELVIS W CONTRAST  Result Date: 05/02/2019 CLINICAL DATA:  Right-sided pain with fever, nausea, and vomiting EXAM: CT ABDOMEN AND PELVIS WITH CONTRAST TECHNIQUE: Multidetector CT imaging of the abdomen and pelvis was performed using the standard protocol following bolus administration of intravenous contrast. CONTRAST:  100mL OMNIPAQUE IOHEXOL 300 MG/ML  SOLN COMPARISON:  January 30, 2018 FINDINGS: Lower chest: There is airspace opacity in portions of the anterior, lateral, and superior segments of the right lower lobe. Lung bases otherwise are clear. Hepatobiliary: No focal liver lesions are appreciable. Gallbladder wall is not appreciably thickened. There is no biliary duct dilatation. Pancreas: There is no pancreatic mass or inflammatory focus. Spleen: No splenic lesions are evident. Adrenals/Urinary Tract: Adrenals bilaterally appear normal. There is a 4 mm apparent cyst in the medial upper pole left kidney, stable. There is no appreciable hydronephrosis on either side. There is no evident renal or ureteral calculus on either side. Urinary bladder is midline with wall thickness within normal limits. Stomach/Bowel: There is no appreciable bowel wall or mesenteric thickening. There is moderate stool in the colon. There is no evident bowel obstruction. Terminal ileum appears unremarkable. There is lipomatous infiltration in the ileocecal valve. There is no free air or portal venous air. Vascular/Lymphatic: Aorta is tortuous. No aneurysm evident. No appreciable vascular calcification. Major venous structures appear patent. There is no adenopathy in the abdomen or pelvis. Reproductive: The uterus is absent.  No evident pelvic mass. Other: Appendix appears unremarkable. No ascites or abscess is evident in the abdomen or pelvis. Musculoskeletal: Postoperative  changes noted at L5-S1. No blastic or lytic bone lesions. No intramuscular or abdominal wall lesions. IMPRESSION: 1.  Apparent pneumonia in several segments of the right lower lobe. 2. No bowel obstruction. No abscess in the abdomen or pelvis. Appendix appears normal. 3. No evident renal or ureteral  calculus. No hydronephrosis. Urinary bladder wall thickness normal. 4.  Uterus absent. 5.  Postoperative change at L5-S1. Electronically Signed   By: Bretta Bang III M.D.   On: 05/02/2019 14:20    ____________________________________________  PROCEDURES   Procedure(s) performed (including Critical Care):  Procedures  ____________________________________________  INITIAL IMPRESSION / MDM / ASSESSMENT AND PLAN / ED COURSE  As part of my medical decision making, I reviewed the following data within the electronic MEDICAL RECORD NUMBER Nursing notes reviewed and incorporated, Old chart reviewed, Notes from prior ED visits, and [place] Controlled Substance Database       *MONIK LINS was evaluated in Emergency Department on 05/02/2019 for the symptoms described in the history of present illness. She was evaluated in the context of the global COVID-19 pandemic, which necessitated consideration that the patient might be at risk for infection with the SARS-CoV-2 virus that causes COVID-19. Institutional protocols and algorithms that pertain to the evaluation of patients at risk for COVID-19 are in a state of rapid change based on information released by regulatory bodies including the CDC and federal and state organizations. These policies and algorithms were followed during the patient's care in the ED.  Some ED evaluations and interventions may be delayed as a result of limited staffing during the pandemic.*     Medical Decision Making:  Very pleasant 59 yo F here with cough, R-sided abdominal pain, general fatigue. Pt afebrile, well appearing and non-toxic. Exam as above, remarkable for diffuse R  sided abd TTP but also R basilar rales. CXR, CT A/P obtained. No signs of cholecystitis, appendicitis, nephrolithiasis, or pyelonephritis. CXR clear but CT does show multifocal RLL PNA. Clinically, this would fit with her cough, chills, and vague R-sided abd pain. She has normal WOB, is speaking in full sentences, and is not hypoxic with ambulation. Will treat broadly for CAP, also re-send COVID given her symptoms (neg 1/20 but has had persistent sx). Return precautions given.  ____________________________________________  FINAL CLINICAL IMPRESSION(S) / ED DIAGNOSES  Final diagnoses:  Community acquired pneumonia of right lower lobe of lung     MEDICATIONS GIVEN DURING THIS VISIT:  Medications  sodium chloride flush (NS) 0.9 % injection 3 mL (3 mLs Intravenous Refused 05/02/19 1220)  morphine 2 MG/ML injection (has no administration in time range)  morphine 4 MG/ML injection 6 mg (6 mg Intravenous Given 05/02/19 1341)  ondansetron (ZOFRAN) injection 4 mg (4 mg Intravenous Given 05/02/19 1341)  sodium chloride 0.9 % bolus 1,000 mL (0 mLs Intravenous Stopped 05/02/19 1525)  iohexol (OMNIPAQUE) 300 MG/ML solution 100 mL (100 mLs Intravenous Contrast Given 05/02/19 1400)  cefTRIAXone (ROCEPHIN) 2 g in sodium chloride 0.9 % 100 mL IVPB (0 g Intravenous Stopped 05/02/19 1525)  azithromycin (ZITHROMAX) 500 mg in sodium chloride 0.9 % 250 mL IVPB (500 mg Intravenous New Bag/Given 05/02/19 1526)  dexamethasone (DECADRON) injection 10 mg (10 mg Intravenous Given 05/02/19 1526)     ED Discharge Orders         Ordered    cefdinir (OMNICEF) 300 MG capsule  2 times daily     05/02/19 1515    doxycycline (VIBRAMYCIN) 100 MG capsule  2 times daily     05/02/19 1515    HYDROcodone-homatropine (HYCODAN) 5-1.5 MG/5ML syrup  Every 6 hours PRN     05/02/19 1515           Note:  This document was prepared using Dragon voice recognition software and may include unintentional dictation errors.  Duffy Bruce, MD 05/02/19 [phone]

## 2019-05-03 LAB — NOVEL CORONAVIRUS, NAA (HOSP ORDER, SEND-OUT TO REF LAB; TAT 18-24 HRS): SARS-CoV-2, NAA: DETECTED — AB

## 2019-05-04 ENCOUNTER — Telehealth: Payer: Self-pay | Admitting: Emergency Medicine

## 2019-05-04 NOTE — Telephone Encounter (Signed)
Called patient to assure she is aware of covid positive.  She is aware and is isolating as instructed.  She will call her doctor as she will need to follow up for the pneumonia.

## 2019-05-23 ENCOUNTER — Other Ambulatory Visit: Payer: Self-pay | Admitting: Family Medicine

## 2019-05-23 DIAGNOSIS — J690 Pneumonitis due to inhalation of food and vomit: Secondary | ICD-10-CM

## 2019-06-22 ENCOUNTER — Other Ambulatory Visit: Payer: Self-pay | Admitting: Dermatology

## 2019-06-22 ENCOUNTER — Ambulatory Visit (INDEPENDENT_AMBULATORY_CARE_PROVIDER_SITE_OTHER): Payer: Medicare Other | Admitting: Dermatology

## 2019-06-22 ENCOUNTER — Other Ambulatory Visit: Payer: Self-pay

## 2019-06-22 DIAGNOSIS — L669 Cicatricial alopecia, unspecified: Secondary | ICD-10-CM

## 2019-06-22 DIAGNOSIS — L280 Lichen simplex chronicus: Secondary | ICD-10-CM | POA: Diagnosis not present

## 2019-06-22 DIAGNOSIS — L905 Scar conditions and fibrosis of skin: Secondary | ICD-10-CM | POA: Diagnosis not present

## 2019-06-22 DIAGNOSIS — L650 Telogen effluvium: Secondary | ICD-10-CM

## 2019-06-22 MED ORDER — TRIAMCINOLONE ACETONIDE 10 MG/ML IJ SUSP
1.0000 mg | Freq: Once | INTRAMUSCULAR | Status: AC
  Administered 2019-06-22: 11:00:00 1 mg

## 2019-06-22 MED ORDER — DOXYCYCLINE HYCLATE 20 MG PO TABS: 20.0000 mg | ORAL_TABLET | Freq: Two times a day (BID) | ORAL | 0 refills | Status: AC

## 2019-06-22 NOTE — Progress Notes (Signed)
   Follow-Up Visit   Subjective  Kristin Mcdowell is a 59 y.o. female who presents for the following: Suture / Staple Removal (scalp).  Follow up alopecia, lesion on scalp.   The following portions of the chart were reviewed this encounter and updated as appropriate: Tobacco  Allergies  Meds  Med Hx  Surg Hx      Review of Systems: No other skin or systemic complaints.  Objective  Well appearing patient in no apparent distress; mood and affect are within normal limits.  A focused examination was performed including scalp. Relevant physical exam findings are noted in the Assessment and Plan.  Objective  Central Scalp, Left vertex scalp (2): Diffuse hair breakage and some thinning and central scalp.  Biopsy showed scarring alopecia consistent with Central Centrifugal scarring alopecia  Objective  Mid Parietal Scalp: Scaly lichenified plaque  Biopsy proven lichen simplex chronicus  Objective  Mid Frontal Scalp: Diffuse loss of follicular ostia along frontal hair line  Objective  Mid Parietal Scalp: Diffuse thinning of hair, positive hair pull test.   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 Advised hair cannot grow back in scarred areas but goal of therapy is to prevent progression. In this area, she primarily has hair breakage rather than loss of follicular ostia.  Will cont. Clobetasol solution.  Will start Doxycycline 20mg  BID Will consider hydroxychloroquine at follow up if not improving  Doxycycline counseling - take doxycycline with food and drink but not with dairy. Do not lay down for 30 minutes after taking. Be cautious with sun exposure and use good sun protection while on this medication.    Pt declined ILK for CCCA today  doxycycline (PERIOSTAT) 20 MG tablet - Central Scalp, Left vertex scalp  triamcinolone acetonide  (KENALOG) 10 MG/ML injection 1 mg - Central Scalp, Left vertex scalp  Lichen simplex chronicus Mid Parietal Scalp  Sutures removed    Intralesional injection - Mid Parietal Scalp Location: Left vertex scalp  Informed Consent: Discussed risks (infection, pain, bleeding, bruising, thinning of the skin, loss of skin pigment, lack of resolution, and recurrence of lesion) and benefits of the procedure, as well as the alternatives. Informed consent was obtained. Preparation: The area was prepared a standard fashion.  Procedure Details: An intralesional injection was performed with intralesional kenalog 5 mg/ml , 0.2 cc in total were injected.  Total number of injections:2 injection sites   Plan: The patient was instructed on post-op care. Recommend OTC analgesia as needed for pain.   Scar conditions and fibrosis of skin Mid Frontal Scalp  End-stage scarring alopecia in this area, possibly from traction alopecia or frontal fibrosing alopecia. No evidence of inflammation at this time. Advised patient that hair will not grow back in scarred areas.   Telogen effluvium Mid Parietal Scalp  Patient has ferritin lab slip to go by for blood draw. Other labs reviewed at last visit.  Return in about 6 weeks (around 08/03/2019) for CCCA, LSC, telogen effluvium.   I5/04/2019, CMA, am acting as scribe for Leward Quan, MD .  Documentation: I have reviewed the above documentation for accuracy and completeness, and I agree with the above.  Darden Dates, MD

## 2019-06-23 LAB — FERRITIN: Ferritin: 140 ng/mL (ref 15–150)

## 2019-06-26 ENCOUNTER — Ambulatory Visit
Admission: RE | Admit: 2019-06-26 | Discharge: 2019-06-26 | Disposition: A | Discharge status: Home / Self Care | Payer: Medicare Other | Source: Ambulatory Visit | Attending: Family Medicine | Admitting: Family Medicine

## 2019-06-26 ENCOUNTER — Other Ambulatory Visit: Payer: Self-pay

## 2019-06-26 DIAGNOSIS — J690 Pneumonitis due to inhalation of food and vomit: Secondary | ICD-10-CM | POA: Insufficient documentation

## 2019-06-26 LAB — POCT I-STAT CREATININE: Creatinine, Ser: 0.9 mg/dL (ref 0.44–1.00)

## 2019-06-26 MED ORDER — IOHEXOL 300 MG/ML  SOLN
75.0000 mL | Freq: Once | INTRAMUSCULAR | Status: AC | PRN
  Administered 2019-06-26: 75 mL via INTRAVENOUS

## 2019-06-27 ENCOUNTER — Encounter: Payer: Self-pay | Admitting: Dermatology

## 2019-06-28 ENCOUNTER — Other Ambulatory Visit: Payer: Self-pay

## 2019-06-28 ENCOUNTER — Ambulatory Visit: Payer: Medicare Other | Attending: Internal Medicine

## 2019-06-28 DIAGNOSIS — Z20822 Contact with and (suspected) exposure to covid-19: Secondary | ICD-10-CM

## 2019-06-29 ENCOUNTER — Ambulatory Visit
Admission: RE | Admit: 2019-06-29 | Discharge: 2019-06-29 | Disposition: A | Discharge status: Home / Self Care | Payer: Medicare Other | Source: Ambulatory Visit | Attending: Family Medicine | Admitting: Family Medicine

## 2019-06-29 DIAGNOSIS — Z1231 Encounter for screening mammogram for malignant neoplasm of breast: Secondary | ICD-10-CM | POA: Insufficient documentation

## 2019-06-29 LAB — SARS-COV-2, NAA 2 DAY TAT

## 2019-06-29 LAB — NOVEL CORONAVIRUS, NAA: SARS-CoV-2, NAA: NOT DETECTED

## 2019-06-29 NOTE — Progress Notes (Signed)
Ferritin 140 (normal) meaning iron stores are normal. No need to do anything for this.

## 2019-07-04 ENCOUNTER — Telehealth: Payer: Self-pay

## 2019-07-04 NOTE — Telephone Encounter (Signed)
Patient advised labs WNL, nothing to do.

## 2019-07-24 ENCOUNTER — Other Ambulatory Visit: Payer: Self-pay

## 2019-07-24 ENCOUNTER — Inpatient Hospital Stay: Payer: Medicare Other | Attending: Oncology

## 2019-07-24 DIAGNOSIS — R748 Abnormal levels of other serum enzymes: Secondary | ICD-10-CM | POA: Diagnosis not present

## 2019-07-24 DIAGNOSIS — D709 Neutropenia, unspecified: Secondary | ICD-10-CM | POA: Insufficient documentation

## 2019-07-24 DIAGNOSIS — Z79899 Other long term (current) drug therapy: Secondary | ICD-10-CM | POA: Insufficient documentation

## 2019-07-24 DIAGNOSIS — I10 Essential (primary) hypertension: Secondary | ICD-10-CM | POA: Insufficient documentation

## 2019-07-24 DIAGNOSIS — K219 Gastro-esophageal reflux disease without esophagitis: Secondary | ICD-10-CM | POA: Diagnosis not present

## 2019-07-24 DIAGNOSIS — E559 Vitamin D deficiency, unspecified: Secondary | ICD-10-CM

## 2019-07-24 LAB — CBC WITH DIFFERENTIAL/PLATELET
Abs Immature Granulocytes: 0.01 10*3/uL (ref 0.00–0.07)
Basophils Absolute: 0 10*3/uL (ref 0.0–0.1)
Basophils Relative: 0 %
Eosinophils Absolute: 0.1 10*3/uL (ref 0.0–0.5)
Eosinophils Relative: 2 %
HCT: 39.5 % (ref 36.0–46.0)
Hemoglobin: 12.8 g/dL (ref 12.0–15.0)
Immature Granulocytes: 0 %
Lymphocytes Relative: 39 %
Lymphs Abs: 1.4 10*3/uL (ref 0.7–4.0)
MCH: 26.6 pg (ref 26.0–34.0)
MCHC: 32.4 g/dL (ref 30.0–36.0)
MCV: 82 fL (ref 80.0–100.0)
Monocytes Absolute: 0.4 10*3/uL (ref 0.1–1.0)
Monocytes Relative: 11 %
Neutro Abs: 1.7 10*3/uL (ref 1.7–7.7)
Neutrophils Relative %: 48 %
Platelets: 256 10*3/uL (ref 150–400)
RBC: 4.82 MIL/uL (ref 3.87–5.11)
RDW: 13.9 % (ref 11.5–15.5)
WBC: 3.6 10*3/uL — ABNORMAL LOW (ref 4.0–10.5)
nRBC: 0 % (ref 0.0–0.2)

## 2019-07-24 LAB — COMPREHENSIVE METABOLIC PANEL
ALT: 14 U/L (ref 0–44)
AST: 18 U/L (ref 15–41)
Albumin: 4.2 g/dL (ref 3.5–5.0)
Alkaline Phosphatase: 135 U/L — ABNORMAL HIGH (ref 38–126)
Anion gap: 11 (ref 5–15)
BUN: 14 mg/dL (ref 6–20)
CO2: 28 mmol/L (ref 22–32)
Calcium: 9.5 mg/dL (ref 8.9–10.3)
Chloride: 101 mmol/L (ref 98–111)
Creatinine, Ser: 1.03 mg/dL — ABNORMAL HIGH (ref 0.44–1.00)
GFR calc Af Amer: >60 mL/min (ref >60)
GFR calc non Af Amer: 60 mL/min — ABNORMAL LOW (ref >60)
Glucose, Bld: 124 mg/dL — ABNORMAL HIGH (ref 70–99)
Potassium: 3.8 mmol/L (ref 3.5–5.1)
Sodium: 140 mmol/L (ref 135–145)
Total Bilirubin: 0.7 mg/dL (ref 0.3–1.2)
Total Protein: 7.9 g/dL (ref 6.5–8.1)

## 2019-07-24 LAB — VITAMIN D 25 HYDROXY (VIT D DEFICIENCY, FRACTURES): Vit D, 25-Hydroxy: 37.44 ng/mL (ref 30–100)

## 2019-07-26 ENCOUNTER — Encounter: Payer: Self-pay | Admitting: Oncology

## 2019-07-26 NOTE — Progress Notes (Signed)
Patient stated that she would like her lab results.

## 2019-07-27 ENCOUNTER — Inpatient Hospital Stay (HOSPITAL_BASED_OUTPATIENT_CLINIC_OR_DEPARTMENT_OTHER): Payer: Medicare Other | Admitting: Oncology

## 2019-07-27 DIAGNOSIS — D709 Neutropenia, unspecified: Secondary | ICD-10-CM

## 2019-07-27 DIAGNOSIS — R748 Abnormal levels of other serum enzymes: Secondary | ICD-10-CM

## 2019-07-27 LAB — ALKALINE PHOSPHATASE, ISOENZYMES
Alk Phos Bone Fract: 22 % (ref 14–68)
Alk Phos Liver Fract: 78 % (ref 18–85)
Alk Phos: 162 IU/L — ABNORMAL HIGH (ref 39–117)
Intestinal %: 0 % (ref 0–18)

## 2019-07-30 NOTE — Progress Notes (Signed)
I connected with Joshua Zeringue on 07/30/19 at 10:30 AM EDT by video enabled telemedicine visit and verified that I am speaking with the correct person using two identifiers.   I discussed the limitations, risks, security and privacy concerns of performing an evaluation and management service by telemedicine and the availability of in-person appointments. I also discussed with the patient that there may be a patient responsible charge related to this service. The patient expressed understanding and agreed to proceed.  Other persons participating in the visit and their role in the encounter:  none  Patient's location:  home Provider's location:  work  Risk analyst Complaint:  Discuss results of bloodwork  History of present illness:  Patient is a 59 yr old female with past medical history of GERD depression has been referred for elevated alkaline phosphatase level. Recent alk phos on 04/06/18 elevated at 156. AST, ALT, GGT normal. TSH normal. Vit D level low at 28.  bloodwork from 04/17/19 showed alk phos elevated at 173. Fractionated alk phos was normal. Calcium mildly elevated at 10.3 and PTH normal at 32.   Interval history: Patient feels well and currently denies any complaints   Review of Systems  Constitutional: Negative for chills, fever, malaise/fatigue and weight loss.  HENT: Negative for congestion, ear discharge and nosebleeds.   Eyes: Negative for blurred vision.  Respiratory: Negative for cough, hemoptysis, sputum production, shortness of breath and wheezing.   Cardiovascular: Negative for chest pain, palpitations, orthopnea and claudication.  Gastrointestinal: Negative for abdominal pain, blood in stool, constipation, diarrhea, heartburn, melena, nausea and vomiting.  Genitourinary: Negative for dysuria, flank pain, frequency, hematuria and urgency.  Musculoskeletal: Negative for back pain, joint pain and myalgias.  Skin: Negative for rash.  Neurological: Negative for dizziness,  tingling, focal weakness, seizures, weakness and headaches.  Endo/Heme/Allergies: Does not bruise/bleed easily.  Psychiatric/Behavioral: Negative for depression and suicidal ideas. The patient does not have insomnia.     Allergies  Allergen Reactions  . Penicillins     By test Other reaction(s): Other (See Comments) By test    Past Medical History:  Diagnosis Date  . Depression   . GERD (gastroesophageal reflux disease)   . Hypertension   . Seasonal allergies     Past Surgical History:  Procedure Laterality Date  . ABDOMINAL HYSTERECTOMY    . COLONOSCOPY WITH PROPOFOL N/A 08/30/2015   Procedure: COLONOSCOPY WITH PROPOFOL;  Surgeon: Lucilla Lame, MD;  Location: [place];  Service: Endoscopy;  Laterality: N/A;  . LUMBAR DISC SURGERY      Social History   Socioeconomic History  . Marital status: Single    Spouse name: Not on file  . Number of children: Not on file  . Years of education: Not on file  . Highest education level: Not on file  Occupational History  . Not on file  Tobacco Use  . Smoking status: Never Smoker  . Smokeless tobacco: Never Used  Substance and Sexual Activity  . Alcohol use: No  . Drug use: Never  . Sexual activity: Not on file  Other Topics Concern  . Not on file  Social History Narrative  . Not on file   Social Determinants of Health   Financial Resource Strain:   . Difficulty of Paying Living Expenses:   Food Insecurity:   . Worried About Charity fundraiser in the Last Year:   . Arboriculturist in the Last Year:   Transportation Needs:   . Film/video editor (Medical):   Marland Kitchen  Lack of Transportation (Non-Medical):   Physical Activity:   . Days of Exercise per Week:   . Minutes of Exercise per Session:   Stress:   . Feeling of Stress :   Social Connections:   . Frequency of Communication with Friends and Family:   . Frequency of Social Gatherings with Friends and Family:   . Attends Religious Services:   . Active  Member of Clubs or Organizations:   . Attends Archivist Meetings:   Marland Kitchen Marital Status:   Intimate Partner Violence:   . Fear of Current or Ex-Partner:   . Emotionally Abused:   Marland Kitchen Physically Abused:   . Sexually Abused:     Family History  Problem Relation Age of Onset  . Heart disease Mother   . Stroke Brother   . Breast cancer Neg Hx      Current Outpatient Medications:  .  acetaminophen (TYLENOL) 325 MG tablet, Take 325 mg by mouth as needed., Disp: , Rfl:  .  amLODipine (NORVASC) 5 MG tablet, Take 5 mg by mouth daily., Disp: , Rfl:  .  Cetirizine HCl 10 MG CAPS, Take 1 tablet by mouth daily as needed., Disp: , Rfl:  .  clobetasol (TEMOVATE) 0.05 % external solution, APPLY A SMALL AMOUNT TO SCALP TWICE DAILY FOR ITCHY AREAS IN THE SCALP. USE TWICE DAILY AS NEEDED AVOID FACE GROIN AND AXILLA, Disp: , Rfl:  .  COD LIVER OIL PO, Take by mouth daily., Disp: , Rfl:  .  cyclobenzaprine (FLEXERIL) 10 MG tablet, Take 10 mg by mouth 2 (two) times daily as needed for muscle spasms., Disp: , Rfl:  .  diazepam (VALIUM) 5 MG tablet, Take 5 mg by mouth 3 (three) times daily as needed., Disp: , Rfl:  .  docusate sodium (COLACE) 100 MG capsule, Take 100 mg by mouth 2 (two) times daily as needed for mild constipation., Disp: , Rfl:  .  lidocaine (LIDODERM) 5 %, Place 1 patch onto the skin daily as needed. Remove & Discard patch within 12 hours or as directed by MD, Disp: , Rfl:  .  Melatonin 1 MG TABS, Take 5 mg by mouth at bedtime as needed., Disp: , Rfl:  .  Multiple Vitamin (MULTIVITAMIN) capsule, Take 1 capsule by mouth daily., Disp: , Rfl:  .  omeprazole (PRILOSEC) 20 MG capsule, Take 20 mg by mouth daily., Disp: , Rfl:   No results found.  No images are attached to the encounter.   CMP Latest Ref Rng & Units 07/24/2019  Glucose 70 - 99 mg/dL 124(H)  BUN 6 - 20 mg/dL 14  Creatinine 0.44 - 1.00 mg/dL 1.03(H)  Sodium 135 - 145 mmol/L 140  Potassium 3.5 - 5.1 mmol/L 3.8   Chloride 98 - 111 mmol/L 101  CO2 22 - 32 mmol/L 28  Calcium 8.9 - 10.3 mg/dL 9.5  Total Protein 6.5 - 8.1 g/dL 7.9  Total Bilirubin 0.3 - 1.2 mg/dL 0.7  Alkaline Phos 38 - 126 U/L 135(H)  AST 15 - 41 U/L 18  ALT 0 - 44 U/L 14   CBC Latest Ref Rng & Units 07/24/2019  WBC 4.0 - 10.5 K/uL 3.6(L)  Hemoglobin 12.0 - 15.0 g/dL 12.8  Hematocrit 36.0 - 46.0 % 39.5  Platelets 150 - 400 K/uL 256     Observation/objective: Appears in no acute distress over video visit today.  Breathing is nonlabored  Assessment and plan: Patient is a 59 year old female referred for elevated alkaline phosphatase  Patient's alkaline phosphatase has been mildly elevated dating back to 2014 but her left is been between 130-160 and never been significantly elevated.  Patient also underwent recent CT chest abdomen and pelvis with contrast this year which did not show any evidence of malignancy that would explain elevated alkaline phosphatase.  At this time I will defer further work-up if desired to her primary care doctor since I do not see any obvious hematological or oncological cause for her elevated alkaline phosphatase. Alkaline phosphatase can also be elevated other none oncology conditions including endocrinology consults suggest Paget's disease osteomalacia etc.  Patient also has baseline leukopenia which again has been chronic and her [place] fluctuates between 1.3-2 which could be benign ethnic neutropenia from African-American ethnicity.  I will follow up on her neutropenia for the next 6 to 12 months and if stable she can continue to follow-up with her primary care doctor at that time  Follow-up instructions: Above  I discussed the assessment and treatment plan with the patient. The patient was provided an opportunity to ask questions and all were answered. The patient agreed with the plan and demonstrated an understanding of the instructions.   The patient was advised to call back or seek an in-person  evaluation if the symptoms worsen or if the condition fails to improve as anticipated.    Visit Diagnosis: 1. Elevated alkaline phosphatase level   2. Neutropenia, unspecified type ([place])     Dr. Randa Evens, MD, MPH [facility] Tel- [phone] 07/30/2019 5:52 PM

## 2019-08-04 ENCOUNTER — Ambulatory Visit: Payer: Medicare Other | Admitting: Dermatology

## 2019-08-17 ENCOUNTER — Ambulatory Visit (INDEPENDENT_AMBULATORY_CARE_PROVIDER_SITE_OTHER): Payer: Medicare Other | Admitting: Cardiology

## 2019-08-17 ENCOUNTER — Other Ambulatory Visit: Payer: Self-pay

## 2019-08-17 ENCOUNTER — Encounter: Payer: Self-pay | Admitting: Cardiology

## 2019-08-17 VITALS — BP 122/80 | HR 72 | Ht 65.0 in | Wt 179.5 lb

## 2019-08-17 DIAGNOSIS — R002 Palpitations: Secondary | ICD-10-CM | POA: Diagnosis not present

## 2019-08-17 DIAGNOSIS — I10 Essential (primary) hypertension: Secondary | ICD-10-CM | POA: Diagnosis not present

## 2019-08-17 NOTE — Progress Notes (Signed)
Cardiology Office Note:    Date:  08/17/2019   ID:  Kristin Mcdowell, DOB 1960-12-09, MRN [id number]  PCP:  Leanna Sato, MD  Cardiologist:  Debbe Odea, MD  Electrophysiologist:  None   Referring MD: [facility]   Chief Complaint  Patient presents with  . Other    Pt does have mild swelling and states legs are aching/ pain after COVID VACCINE. Meds verbally reviewed w/ pt.     History of Present Illness:    Kristin Mcdowell is a 59 y.o. female with a hx of hypertension, GERD, palpitations who presents for follow-up.  She has a history of palpitations.  Prior cardiac monitor showed no significant arrhythmias, an episode of 8 beats of nonsustained VT occurred not associated with symptoms.  Patient triggered events were associated with sinus rhythm.  She has no other cardiac symptoms.  A trial of beta-blocker was discussed during last visit but patient wanted to monitor symptoms.  Patient states feeling fine since last visit, has had maybe one episode of palpitation.  She had her Covid vaccine last month, associated with some muscle aches but is otherwise feeling okay at the moment.  Has no concerns at this time.   Past Medical History:  Diagnosis Date  . Depression   . GERD (gastroesophageal reflux disease)   . Hypertension   . Seasonal allergies     Past Surgical History:  Procedure Laterality Date  . ABDOMINAL HYSTERECTOMY    . COLONOSCOPY WITH PROPOFOL N/A 08/30/2015   Procedure: COLONOSCOPY WITH PROPOFOL;  Surgeon: Midge Minium, MD;  Location: [facility];  Service: Endoscopy;  Laterality: N/A;  . LUMBAR DISC SURGERY      Current Medications: Current Meds  Medication Sig  . acetaminophen (TYLENOL) 325 MG tablet Take 325 mg by mouth as needed.  Marland Kitchen amLODipine (NORVASC) 5 MG tablet Take 5 mg by mouth daily.  . Cetirizine HCl 10 MG CAPS Take 1 tablet by mouth daily as needed.  . clobetasol (TEMOVATE) 0.05 % external solution APPLY A SMALL AMOUNT  TO SCALP TWICE DAILY FOR ITCHY AREAS IN THE SCALP. USE TWICE DAILY AS NEEDED AVOID FACE GROIN AND AXILLA  . COD LIVER OIL PO Take by mouth daily.  . cyclobenzaprine (FLEXERIL) 10 MG tablet Take 10 mg by mouth 2 (two) times daily as needed for muscle spasms.  . diazepam (VALIUM) 5 MG tablet Take 5 mg by mouth 3 (three) times daily as needed.  . docusate sodium (COLACE) 100 MG capsule Take 100 mg by mouth 2 (two) times daily as needed for mild constipation.  . lidocaine (LIDODERM) 5 % Place 1 patch onto the skin daily as needed. Remove & Discard patch within 12 hours or as directed by MD  . Melatonin 1 MG TABS Take 5 mg by mouth at bedtime as needed.  . Multiple Vitamin (MULTIVITAMIN) capsule Take 1 capsule by mouth daily.  Marland Kitchen omeprazole (PRILOSEC) 20 MG capsule Take 20 mg by mouth daily.     Allergies:   Penicillins   Social History   Socioeconomic History  . Marital status: Single    Spouse name: Not on file  . Number of children: Not on file  . Years of education: Not on file  . Highest education level: Not on file  Occupational History  . Not on file  Tobacco Use  . Smoking status: Never Smoker  . Smokeless tobacco: Never Used  Substance and Sexual Activity  . Alcohol use: No  .  Drug use: Never  . Sexual activity: Not on file  Other Topics Concern  . Not on file  Social History Narrative  . Not on file   Social Determinants of Health   Financial Resource Strain:   . Difficulty of Paying Living Expenses:   Food Insecurity:   . Worried About Programme researcher, broadcasting/film/video in the Last Year:   . Barista in the Last Year:   Transportation Needs:   . Freight forwarder (Medical):   Marland Kitchen Lack of Transportation (Non-Medical):   Physical Activity:   . Days of Exercise per Week:   . Minutes of Exercise per Session:   Stress:   . Feeling of Stress :   Social Connections:   . Frequency of Communication with Friends and Family:   . Frequency of Social Gatherings with Friends and  Family:   . Attends Religious Services:   . Active Member of Clubs or Organizations:   . Attends Banker Meetings:   Marland Kitchen Marital Status:      Family History: The patient's family history includes Heart disease in her mother; Stroke in her brother. There is no history of Breast cancer.  ROS:   Please see the history of present illness.     All other systems reviewed and are negative.  EKGs/Labs/Other Studies Reviewed:    The following studies were reviewed today:  2-week cardiac monitor date 03/10/2019 Patient had a min HR of 48 bpm, max HR of 141 bpm, and avg HR of 76 bpm. Predominant underlying rhythm was Sinus Rhythm. 1 run of Ventricular Tachycardia occurred lasting 8 beats with a max rate of 124 bpm (avg 103 bpm). Isolated SVEs were rare (<1.0%), SVE Couplets were rare (<1.0%), and no SVE Triplets were present. Isolated VEs were rare (<1.0%), and no VE Couplets or VE Triplets were present.  Patient triggered events were associated with sinus rhythm.  EKG:  EKG is  ordered today.  The ekg ordered today demonstrates normal sinus rhythm, left axis deviation.  Recent Labs: 07/24/2019: ALT 14; BUN 14; Creatinine, Ser 1.03; Hemoglobin 12.8; Platelets 256; Potassium 3.8; Sodium 140  Recent Lipid Panel No results found for: CHOL, TRIG, HDL, CHOLHDL, VLDL, LDLCALC, LDLDIRECT  Physical Exam:    VS:  BP 122/80 (BP Location: Left Arm, Patient Position: Sitting, Cuff Size: Normal)   Pulse 72   Ht 5\' 5"  (1.651 m)   Wt 179 lb 8 oz (81.4 kg)   SpO2 98%   BMI 29.87 kg/m     Wt Readings from Last 3 Encounters:  08/17/19 179 lb 8 oz (81.4 kg)  05/02/19 178 lb 6.4 oz (80.9 kg)  04/17/19 179 lb 6.4 oz (81.4 kg)     GEN:  Well nourished, well developed in no acute distress HEENT: Normal NECK: No JVD; No carotid bruits LYMPHATICS: No lymphadenopathy CARDIAC: RRR, no murmurs, rubs, gallops RESPIRATORY:  Clear to auscultation without rales, wheezing or rhonchi  ABDOMEN:  Soft, non-tender, non-distended MUSCULOSKELETAL:  No edema; No deformity  SKIN: Warm and dry NEUROLOGIC:  Alert and oriented x 3 PSYCHIATRIC:  Normal affect   ASSESSMENT:     1. Palpitations   2. Essential hypertension    PLAN:    In order of problems listed above:  1. Patient with history of palpitations, symptoms of improved since last visit.  Prior cardiac monitor with no significant arrhythmias.  Patient reassured.   2. History of hypertension, blood pressure well controlled.  Continue amlodipine 5  mg daily.  Follow-up as needed  This note was generated in part or whole with voice recognition software. Voice recognition is usually quite accurate but there are transcription errors that can and very often do occur. I apologize for any typographical errors that were not detected and corrected.  Medication Adjustments/Labs and Tests Ordered: Current medicines are reviewed at length with the patient today.  Concerns regarding medicines are outlined above.  Orders Placed This Encounter  Procedures  . EKG 12-Lead   No orders of the defined types were placed in this encounter.    Patient Instructions  Medication Instructions:  Your physician recommends that you continue on your current medications as directed. Please refer to the Current Medication list given to you today.  *If you need a refill on your cardiac medications before your next appointment, please call your pharmacy*   Lab Work: None ordered If you have labs (blood work) drawn today and your tests are completely normal, you will receive your results only by: Marland Kitchen MyChart Message (if you have MyChart) OR . A paper copy in the mail If you have any lab test that is abnormal or we need to change your treatment, we will call you to review the results.   Testing/Procedures: None ordered   Follow-Up: At [facility], you and your health needs are our priority.  As part of our continuing mission to provide you with  exceptional heart care, we have created designated Provider Care Teams.  These Care Teams include your primary Cardiologist (physician) and Advanced Practice Providers (APPs -  Physician Assistants and Nurse Practitioners) who all work together to provide you with the care you need, when you need it.  We recommend signing up for the patient portal called "MyChart".  Sign up information is provided on this After Visit Summary.  MyChart is used to connect with patients for Virtual Visits (Telemedicine).  Patients are able to view lab/test results, encounter notes, upcoming appointments, etc.  Non-urgent messages can be sent to your provider as well.   To learn more about what you can do with MyChart, go to [email or web address].    Your next appointment:   As needed   The format for your next appointment:   In Person  Provider:    You may see Kate Sable, MD or one of the following Advanced Practice Providers on your designated Care Team:    Murray Hodgkins, NP  Christell Faith, PA-C  Marrianne Mood, PA-C    Other Instructions N/A     Signed, Kate Sable, MD  08/17/2019 12:38 PM    [place]

## 2019-08-17 NOTE — Patient Instructions (Signed)
Medication Instructions:  Your physician recommends that you continue on your current medications as directed. Please refer to the Current Medication list given to you today.  *If you need a refill on your cardiac medications before your next appointment, please call your pharmacy*   Lab Work: None ordered If you have labs (blood work) drawn today and your tests are completely normal, you will receive your results only by: . MyChart Message (if you have MyChart) OR . A paper copy in the mail If you have any lab test that is abnormal or we need to change your treatment, we will call you to review the results.   Testing/Procedures: None ordered   Follow-Up: At [facility], you and your health needs are our priority.  As part of our continuing mission to provide you with exceptional heart care, we have created designated Provider Care Teams.  These Care Teams include your primary Cardiologist (physician) and Advanced Practice Providers (APPs -  Physician Assistants and Nurse Practitioners) who all work together to provide you with the care you need, when you need it.  We recommend signing up for the patient portal called "MyChart".  Sign up information is provided on this After Visit Summary.  MyChart is used to connect with patients for Virtual Visits (Telemedicine).  Patients are able to view lab/test results, encounter notes, upcoming appointments, etc.  Non-urgent messages can be sent to your provider as well.   To learn more about what you can do with MyChart, go to [email or web address].    Your next appointment:   As needed   The format for your next appointment:   In Person  Provider:    You may see Brian Agbor-Etang, MD   or one of the following Advanced Practice Providers on your designated Care Team:    Christopher Berge, NP  Ryan Dunn, PA-C  Jacquelyn Visser, PA-C    Other Instructions N/A  

## 2019-10-04 ENCOUNTER — Ambulatory Visit: Payer: Medicare Other | Admitting: Dermatology

## 2020-01-26 ENCOUNTER — Inpatient Hospital Stay: Payer: Medicare Other | Attending: Oncology | Admitting: Oncology

## 2020-01-26 ENCOUNTER — Other Ambulatory Visit: Payer: Self-pay

## 2020-01-26 ENCOUNTER — Inpatient Hospital Stay: Payer: Medicare Other

## 2020-01-26 VITALS — BP 128/88 | HR 72 | Temp 98.8°F | Wt 175.0 lb

## 2020-01-26 DIAGNOSIS — R748 Abnormal levels of other serum enzymes: Secondary | ICD-10-CM | POA: Diagnosis not present

## 2020-01-26 DIAGNOSIS — D709 Neutropenia, unspecified: Secondary | ICD-10-CM | POA: Diagnosis not present

## 2020-01-26 DIAGNOSIS — I10 Essential (primary) hypertension: Secondary | ICD-10-CM | POA: Diagnosis not present

## 2020-01-26 DIAGNOSIS — Z79899 Other long term (current) drug therapy: Secondary | ICD-10-CM | POA: Insufficient documentation

## 2020-01-26 DIAGNOSIS — K219 Gastro-esophageal reflux disease without esophagitis: Secondary | ICD-10-CM | POA: Diagnosis not present

## 2020-01-26 LAB — CBC WITH DIFFERENTIAL/PLATELET
Abs Immature Granulocytes: 0 10*3/uL (ref 0.00–0.07)
Basophils Absolute: 0 10*3/uL (ref 0.0–0.1)
Basophils Relative: 0 %
Eosinophils Absolute: 0.1 10*3/uL (ref 0.0–0.5)
Eosinophils Relative: 3 %
HCT: 36.8 % (ref 36.0–46.0)
Hemoglobin: 12 g/dL (ref 12.0–15.0)
Immature Granulocytes: 0 %
Lymphocytes Relative: 35 %
Lymphs Abs: 1 10*3/uL (ref 0.7–4.0)
MCH: 26.8 pg (ref 26.0–34.0)
MCHC: 32.6 g/dL (ref 30.0–36.0)
MCV: 82.1 fL (ref 80.0–100.0)
Monocytes Absolute: 0.4 10*3/uL (ref 0.1–1.0)
Monocytes Relative: 14 %
Neutro Abs: 1.4 10*3/uL — ABNORMAL LOW (ref 1.7–7.7)
Neutrophils Relative %: 48 %
Platelets: 221 10*3/uL (ref 150–400)
RBC: 4.48 MIL/uL (ref 3.87–5.11)
RDW: 13.6 % (ref 11.5–15.5)
WBC: 2.9 10*3/uL — ABNORMAL LOW (ref 4.0–10.5)
nRBC: 0 % (ref 0.0–0.2)

## 2020-01-26 LAB — COMPREHENSIVE METABOLIC PANEL
ALT: 16 U/L (ref 0–44)
AST: 22 U/L (ref 15–41)
Albumin: 3.9 g/dL (ref 3.5–5.0)
Alkaline Phosphatase: 126 U/L (ref 38–126)
Anion gap: 9 (ref 5–15)
BUN: 17 mg/dL (ref 6–20)
CO2: 28 mmol/L (ref 22–32)
Calcium: 9 mg/dL (ref 8.9–10.3)
Chloride: 101 mmol/L (ref 98–111)
Creatinine, Ser: 1.15 mg/dL — ABNORMAL HIGH (ref 0.44–1.00)
GFR, Estimated: 55 mL/min — ABNORMAL LOW (ref >60)
Glucose, Bld: 117 mg/dL — ABNORMAL HIGH (ref 70–99)
Potassium: 3.8 mmol/L (ref 3.5–5.1)
Sodium: 138 mmol/L (ref 135–145)
Total Bilirubin: 0.6 mg/dL (ref 0.3–1.2)
Total Protein: 7 g/dL (ref 6.5–8.1)

## 2020-01-28 ENCOUNTER — Encounter: Payer: Self-pay | Admitting: Oncology

## 2020-01-28 NOTE — Progress Notes (Signed)
Hematology/Oncology Consult note [facility]  [phone] [phone]  Patient Care Team: Marguerita Merles, MD as PCP - General (Family Medicine) Kate Sable, MD as PCP - Cardiology (Cardiology)   Name of the patient: Kristin Mcdowell  [id number]  1960/04/09   Date of visit: 01/28/20  Diagnosis-elevated alkaline phosphatase level Chronic neutropenia likely benign  Chief complaint/ Reason for visit-routine follow-up of neutropenia and alkaline phosphatase  Heme/Onc history: Patient is a 59 yr old female with past medical history of GERD depression has been referred for elevated alkaline phosphatase level. Recent alk phos on 04/06/18 elevated at 156. AST, ALT, GGT normal. TSH normal. Vit D level low at 28.  bloodwork from 04/17/19 showed alk phos elevated at 173. Fractionated alk phos was normal. Calcium mildly elevated at 10.3 and PTH normal at 32.  Patient also has chronic isolated mild neutropenia which is being followed conservatively without a bone marrow biopsy  Interval history-patient reports feeling well and has not had any recent hospitalizations.  She gets sinopulmonary infections about once or twice a year for which she requires antibiotics.  Appetite and weight have remained stable  ECOG PS- 0 Pain scale- 0   Review of systems- Review of Systems  Constitutional: Negative for chills, fever, malaise/fatigue and weight loss.  HENT: Negative for congestion, ear discharge and nosebleeds.   Eyes: Negative for blurred vision.  Respiratory: Negative for cough, hemoptysis, sputum production, shortness of breath and wheezing.   Cardiovascular: Negative for chest pain, palpitations, orthopnea and claudication.  Gastrointestinal: Negative for abdominal pain, blood in stool, constipation, diarrhea, heartburn, melena, nausea and vomiting.  Genitourinary: Negative for dysuria, flank pain, frequency, hematuria and urgency.    Musculoskeletal: Negative for back pain, joint pain and myalgias.  Skin: Negative for rash.  Neurological: Negative for dizziness, tingling, focal weakness, seizures, weakness and headaches.  Endo/Heme/Allergies: Does not bruise/bleed easily.  Psychiatric/Behavioral: Negative for depression and suicidal ideas. The patient does not have insomnia.       Allergies  Allergen Reactions  . Penicillins     By test Other reaction(s): Other (See Comments) By test     Past Medical History:  Diagnosis Date  . Depression   . GERD (gastroesophageal reflux disease)   . Hypertension   . Seasonal allergies      Past Surgical History:  Procedure Laterality Date  . ABDOMINAL HYSTERECTOMY    . COLONOSCOPY WITH PROPOFOL N/A 08/30/2015   Procedure: COLONOSCOPY WITH PROPOFOL;  Surgeon: Lucilla Lame, MD;  Location: [place];  Service: Endoscopy;  Laterality: N/A;  . LUMBAR DISC SURGERY      Social History   Socioeconomic History  . Marital status: Single    Spouse name: Not on file  . Number of children: Not on file  . Years of education: Not on file  . Highest education level: Not on file  Occupational History  . Not on file  Tobacco Use  . Smoking status: Never Smoker  . Smokeless tobacco: Never Used  Vaping Use  . Vaping Use: Never used  Substance and Sexual Activity  . Alcohol use: No  . Drug use: Never  . Sexual activity: Not on file  Other Topics Concern  . Not on file  Social History Narrative  . Not on file   Social Determinants of Health   Financial Resource Strain:   . Difficulty of Paying Living Expenses: Not on file  Food Insecurity:   . Worried About Estate manager/land agent  of Food in the Last Year: Not on file  . Ran Out of Food in the Last Year: Not on file  Transportation Needs:   . Lack of Transportation (Medical): Not on file  . Lack of Transportation (Non-Medical): Not on file  Physical Activity:   . Days of Exercise per Week: Not on file  . Minutes of  Exercise per Session: Not on file  Stress:   . Feeling of Stress : Not on file  Social Connections:   . Frequency of Communication with Friends and Family: Not on file  . Frequency of Social Gatherings with Friends and Family: Not on file  . Attends Religious Services: Not on file  . Active Member of Clubs or Organizations: Not on file  . Attends Archivist Meetings: Not on file  . Marital Status: Not on file  Intimate Partner Violence:   . Fear of Current or Ex-Partner: Not on file  . Emotionally Abused: Not on file  . Physically Abused: Not on file  . Sexually Abused: Not on file    Family History  Problem Relation Age of Onset  . Heart disease Mother   . Stroke Brother   . Breast cancer Neg Hx      Current Outpatient Medications:  .  acetaminophen (TYLENOL) 325 MG tablet, Take 325 mg by mouth as needed., Disp: , Rfl:  .  amLODipine-atorvastatin (CADUET) 5-10 MG tablet, Take by mouth., Disp: , Rfl:  .  clobetasol (TEMOVATE) 0.05 % external solution, APPLY A SMALL AMOUNT TO SCALP TWICE DAILY FOR ITCHY AREAS IN THE SCALP. USE TWICE DAILY AS NEEDED AVOID FACE GROIN AND AXILLA, Disp: , Rfl:  .  COD LIVER OIL PO, Take by mouth daily., Disp: , Rfl:  .  cyclobenzaprine (FLEXERIL) 10 MG tablet, Take 10 mg by mouth 2 (two) times daily as needed for muscle spasms., Disp: , Rfl:  .  docusate sodium (COLACE) 100 MG capsule, Take 100 mg by mouth 2 (two) times daily as needed for mild constipation., Disp: , Rfl:  .  lidocaine (LIDODERM) 5 %, Place 1 patch onto the skin daily as needed. Remove & Discard patch within 12 hours or as directed by MD, Disp: , Rfl:  .  Melatonin 1 MG TABS, Take 5 mg by mouth at bedtime as needed., Disp: , Rfl:  .  Multiple Vitamin (MULTIVITAMIN) capsule, Take 1 capsule by mouth daily., Disp: , Rfl:  .  omeprazole (PRILOSEC) 20 MG capsule, Take 20 mg by mouth daily., Disp: , Rfl:  .  benzonatate (TESSALON) 200 MG capsule, Take by mouth. (Patient not taking:  Reported on 01/26/2020), Disp: , Rfl:   Physical exam:  Vitals:   01/26/20 1127  BP: 128/88  Pulse: 72  Temp: 98.8 F (37.1 C)  TempSrc: Tympanic  SpO2: 100%  Weight: 175 lb (79.4 kg)   Physical Exam Constitutional:      General: She is not in acute distress. Cardiovascular:     Rate and Rhythm: Normal rate and regular rhythm.     Heart sounds: Normal heart sounds.  Pulmonary:     Effort: Pulmonary effort is normal.     Breath sounds: Normal breath sounds.  Abdominal:     General: Bowel sounds are normal.     Palpations: Abdomen is soft.  Skin:    General: Skin is warm and dry.  Neurological:     Mental Status: She is alert and oriented to person, place, and time.  CMP Latest Ref Rng & Units 01/26/2020  Glucose 70 - 99 mg/dL 117(H)  BUN 6 - 20 mg/dL 17  Creatinine 0.44 - 1.00 mg/dL 1.15(H)  Sodium 135 - 145 mmol/L 138  Potassium 3.5 - 5.1 mmol/L 3.8  Chloride 98 - 111 mmol/L 101  CO2 22 - 32 mmol/L 28  Calcium 8.9 - 10.3 mg/dL 9.0  Total Protein 6.5 - 8.1 g/dL 7.0  Total Bilirubin 0.3 - 1.2 mg/dL 0.6  Alkaline Phos 38 - 126 U/L 126  AST 15 - 41 U/L 22  ALT 0 - 44 U/L 16   CBC Latest Ref Rng & Units 01/26/2020  WBC 4.0 - 10.5 K/uL 2.9(L)  Hemoglobin 12.0 - 15.0 g/dL 12.0  Hematocrit 36 - 46 % 36.8  Platelets 150 - 400 K/uL 221     Assessment and plan- Patient is a 59 y.o. female who is here for follow-up of following issues:  1.  Elevated alkaline phosphatase: This is been mild And fractionated alkaline phosphatase revealed no specific preponderance to liver bone or intestine.  Patient was noted to have low levels of vitamin D for which she was taking vitamin D supplements.  She has had CT chest abdomen and pelvis in the last 1 year which did not reveal any malignancy.  Today her levels are normal.  2.  Chronic neutropenia:Patient has had isolated neutropenia even dating back to 2014 and back then her [place] was 2.  Presently since the last 1 year her [place]  has been fluctuating between 1.3 and 1.7.  Hemoglobin and platelets are normal.  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  However if there is a consistent downward trend in her neutropenia I will consider doing it at that time.  Repeat CBC with differential in 4 and 8 months and I will see her back in 8 months   Visit Diagnosis 1. Elevated alkaline phosphatase level   2. Chronic neutropenia (HCC)      Dr. Randa Evens, MD, MPH [facility] [phone] 01/28/2020 10:37 AM

## 2020-02-16 ENCOUNTER — Ambulatory Visit (INDEPENDENT_AMBULATORY_CARE_PROVIDER_SITE_OTHER): Payer: Medicare Other | Admitting: Cardiology

## 2020-02-16 ENCOUNTER — Encounter: Payer: Self-pay | Admitting: Cardiology

## 2020-02-16 ENCOUNTER — Other Ambulatory Visit: Payer: Self-pay

## 2020-02-16 VITALS — BP 128/88 | HR 71 | Ht 65.0 in | Wt 174.0 lb

## 2020-02-16 DIAGNOSIS — I10 Essential (primary) hypertension: Secondary | ICD-10-CM

## 2020-02-16 DIAGNOSIS — R072 Precordial pain: Secondary | ICD-10-CM

## 2020-02-16 DIAGNOSIS — R079 Chest pain, unspecified: Secondary | ICD-10-CM | POA: Diagnosis not present

## 2020-02-16 MED ORDER — ALPRAZOLAM ER 0.5 MG PO TB24: 0.5000 mg | ORAL_TABLET | Freq: Once | ORAL | 0 refills | Status: AC

## 2020-02-16 NOTE — Patient Instructions (Signed)
Medication Instructions:  Your physician recommends that you continue on your current medications as directed. Please refer to the Current Medication list given to you today.  *If you need a refill on your cardiac medications before your next appointment, please call your pharmacy*   Lab Work: None ordered.   Testing/Procedures:  1) Your physician has requested that you have an echocardiogram. Echocardiography is a painless test that uses sound waves to create images of your heart. It provides your doctor with information about the size and shape of your heart and how well your heart's chambers and valves are working. This procedure takes approximately one hour. There are no restrictions for this procedure.  2) [facility] MYOVIEW   Your caregiver has ordered a Stress Test with nuclear imaging. The purpose of this test is to evaluate the blood supply to your heart muscle. This procedure is referred to as a "Non-Invasive Stress Test." This is because other than having an IV started in your vein, nothing is inserted or "invades" your body. Cardiac stress tests are done to find areas of poor blood flow to the heart by determining the extent of coronary artery disease (CAD). Some patients exercise on a treadmill, which naturally increases the blood flow to your heart, while others who are  unable to walk on a treadmill due to physical limitations have a pharmacologic/chemical stress agent called Lexiscan . This medicine will mimic walking on a treadmill by temporarily increasing your coronary blood flow.      PLEASE REPORT TO [facility] MEDICAL MALL ENTRANCE   THE VOLUNTEERS AT THE FIRST DESK WILL DIRECT YOU WHERE TO GO     *Please note: these test may take anywhere between 2-4 hours to complete       Date of Procedure:_____________________________________   Arrival Time for Procedure:______________________________    PLEASE NOTIFY THE OFFICE AT LEAST 24 HOURS IN ADVANCE IF YOU ARE UNABLE TO KEEP YOUR  APPOINTMENT.  [phone]  PLEASE NOTIFY NUCLEAR MEDICINE AT [facility] AT LEAST 24 HOURS IN ADVANCE IF YOU ARE UNABLE TO KEEP YOUR APPOINTMENT. [phone]         How to prepare for your Myoview test:   1. Do not eat or drink after midnight  2. No caffeine for 24 hours prior to test  3. No smoking 24 hours prior to test.  4. Unless instructed otherwise, Take your medication with a small sips of water.    5.         Ladies, please do not wear dresses. Skirts or pants are appropriate. Please wear a short sleeve shirt.  6. No perfume, cologne or lotion.  7. Wear comfortable walking shoes. No heels!    Follow-Up: At [facility], you and your health needs are our priority.  As part of our continuing mission to provide you with exceptional heart care, we have created designated Provider Care Teams.  These Care Teams include your primary Cardiologist (physician) and Advanced Practice Providers (APPs -  Physician Assistants and Nurse Practitioners) who all work together to provide you with the care you need, when you need it.  We recommend signing up for the patient portal called "MyChart".  Sign up information is provided on this After Visit Summary.  MyChart is used to connect with patients for Virtual Visits (Telemedicine).  Patients are able to view lab/test results, encounter notes, upcoming appointments, etc.  Non-urgent messages can be sent to your provider as well.   To learn more about what you can do with  MyChart, go to [email or web address].    Your next appointment:    Follow up AFTER Echo and Myoview  The format for your next appointment:   In Person  Provider:   Debbe Odea, MD

## 2020-02-16 NOTE — Progress Notes (Signed)
Cardiology Office Note:    Date:  02/16/2020   ID:  Kristin Mcdowell, DOB 02/10/1961, MRN [id number]  PCP:  Leanna Sato, MD  Cardiologist:  Debbe Odea, MD  Electrophysiologist:  None   Referring MD: Leanna Sato, MD   Chief Complaint  Patient presents with   OTHER    C/o sharp pain in her chest in October and since that happened her chest has been kind of tight, she has also been feeling weak. Per patient her PCP noticed something on her EKG. Medications verbally reviewed with patient.    History of Present Illness:    Kristin Mcdowell is a 59 y.o. female with a hx of hypertension, GERD,  who presents due to chest pain.  Patient states having symptoms of chest pain over the past 2 months.  She had an incident a month ago when she was upset with her grandkids.  Describe chest tightness, substernal, pressure-like persistent.  She had to go lay down for couple of minutes to help with symptoms.  Since then she has had occasional/similar chest discomfort not always associated with exertion.  Saw her primary care provider where EKG was obtained and was told it was abnormal.  She denies any family history of heart disease.  Denies edema.  Prior notes History of palpitations, prior cardiac monitor showed no significant arrhythmias, an episode of 8 beats of nonsustained VT occurred not associated with symptoms.  Patient triggered events were associated with sinus rhythm.  She has no other cardiac symptoms.  A trial of beta-blocker was discussed previously but patient wanted to monitor symptoms of medications.   Past Medical History:  Diagnosis Date   Depression    GERD (gastroesophageal reflux disease)    Hypertension    Seasonal allergies     Past Surgical History:  Procedure Laterality Date   ABDOMINAL HYSTERECTOMY     COLONOSCOPY WITH PROPOFOL N/A 08/30/2015   Procedure: COLONOSCOPY WITH PROPOFOL;  Surgeon: Midge Minium, MD;  [facility];  Service:  Endoscopy;  Laterality: N/A;   LUMBAR DISC SURGERY      Current Medications: Current Meds  Medication Sig   acetaminophen (TYLENOL) 325 MG tablet Take 325 mg by mouth as needed.   amLODipine-atorvastatin (CADUET) 5-10 MG tablet Take by mouth.   COD LIVER OIL PO Take by mouth daily.   cyclobenzaprine (FLEXERIL) 10 MG tablet Take 10 mg by mouth 2 (two) times daily as needed for muscle spasms.   docusate sodium (COLACE) 100 MG capsule Take 100 mg by mouth 2 (two) times daily as needed for mild constipation.   lidocaine (LIDODERM) 5 % Place 1 patch onto the skin daily as needed. Remove & Discard patch within 12 hours or as directed by MD   Multiple Vitamin (MULTIVITAMIN) capsule Take 1 capsule by mouth daily.   omeprazole (PRILOSEC) 20 MG capsule Take 20 mg by mouth daily.     Allergies:   Penicillins   Social History   Socioeconomic History   Marital status: Single    Spouse name: Not on file   Number of children: Not on file   Years of education: Not on file   Highest education level: Not on file  Occupational History   Not on file  Tobacco Use   Smoking status: Never Smoker   Smokeless tobacco: Never Used  Vaping Use   Vaping Use: Never used  Substance and Sexual Activity   Alcohol use: No   Drug use: Never  Sexual activity: Not on file  Other Topics Concern   Not on file  Social History Narrative   Not on file   Social Determinants of Health   Financial Resource Strain:    Difficulty of Paying Living Expenses: Not on file  Food Insecurity:    Worried About Running Out of Food in the Last Year: Not on file   Ran Out of Food in the Last Year: Not on file  Transportation Needs:    Lack of Transportation (Medical): Not on file   Lack of Transportation (Non-Medical): Not on file  Physical Activity:    Days of Exercise per Week: Not on file   Minutes of Exercise per Session: Not on file  Stress:    Feeling of Stress : Not on file   Social Connections:    Frequency of Communication with Friends and Family: Not on file   Frequency of Social Gatherings with Friends and Family: Not on file   Attends Religious Services: Not on file   Active Member of Clubs or Organizations: Not on file   Attends Banker Meetings: Not on file   Marital Status: Not on file     Family History: The patient's family history includes Heart disease in her mother; Stroke in her brother. There is no history of Breast cancer.  ROS:   Please see the history of present illness.     All other systems reviewed and are negative.  EKGs/Labs/Other Studies Reviewed:    The following studies were reviewed today:  2-week cardiac monitor date 03/10/2019 Patient had a min HR of 48 bpm, max HR of 141 bpm, and avg HR of 76 bpm. Predominant underlying rhythm was Sinus Rhythm. 1 run of Ventricular Tachycardia occurred lasting 8 beats with a max rate of 124 bpm (avg 103 bpm). Isolated SVEs were rare (<1.0%), SVE Couplets were rare (<1.0%), and no SVE Triplets were present. Isolated VEs were rare (<1.0%), and no VE Couplets or VE Triplets were present.  Patient triggered events were associated with sinus rhythm.  EKG:  EKG is  ordered today.  The ekg ordered today demonstrates normal sinus rhythm, left axis deviation.  Recent Labs: 01/26/2020: ALT 16; BUN 17; Creatinine, Ser 1.15; Hemoglobin 12.0; Platelets 221; Potassium 3.8; Sodium 138  Recent Lipid Panel No results found for: CHOL, TRIG, HDL, CHOLHDL, VLDL, LDLCALC, LDLDIRECT  Physical Exam:    VS:  BP 128/88 (BP Location: Left Arm, Patient Position: Sitting, Cuff Size: Normal)    Pulse 71    Ht 5\' 5"  (1.651 m)    Wt 174 lb (78.9 kg)    SpO2 97%    BMI 28.96 kg/m     Wt Readings from Last 3 Encounters:  02/16/20 174 lb (78.9 kg)  01/26/20 175 lb (79.4 kg)  08/17/19 179 lb 8 oz (81.4 kg)     GEN:  Well nourished, well developed in no acute distress HEENT: Normal NECK: No  JVD; No carotid bruits LYMPHATICS: No lymphadenopathy CARDIAC: RRR, no murmurs, rubs, gallops RESPIRATORY:  Clear to auscultation without rales, wheezing or rhonchi  ABDOMEN: Soft, non-tender, non-distended MUSCULOSKELETAL:  No edema; No deformity  SKIN: Warm and dry NEUROLOGIC:  Alert and oriented x 3 PSYCHIATRIC:  Normal affect   ASSESSMENT:    1. Chest pain of uncertain etiology   2. Essential hypertension   3. Precordial pain    PLAN:    In order of problems listed above:  1. Patient with symptoms of chest pain.  Risk factors of hypertension.  Symptoms occurred in the setting of stress consistent with angina.  Will get echocardiogram to evaluate any cardiac function, coronary CTA was initially planned, but not approved by her insurance.  [facility].  Patient has some claustrophobia, will give preprocedural Xanax 0.5 mg for claustrophobia. 2. History of hypertension, BP controlled, continue current meds  Follow-up after echo and Lexiscan Myoview  This note was generated in part or whole with voice recognition software. Voice recognition is usually quite accurate but there are transcription errors that can and very often do occur. I apologize for any typographical errors that were not detected and corrected.  Medication Adjustments/Labs and Tests Ordered: Current medicines are reviewed at length with the patient today.  Concerns regarding medicines are outlined above.  Orders Placed This Encounter  Procedures   NM Myocar Multi W/Spect W/Wall Motion / EF   EKG 12-Lead   ECHOCARDIOGRAM COMPLETE   Meds ordered this encounter  Medications   ALPRAZolam (XANAX XR) 0.5 MG 24 hr tablet    Sig: Take 1 tablet (0.5 mg total) by mouth once for 1 dose. Take ONE HOUR prior to cardiac procedure    Dispense:  1 tablet    Refill:  0     Patient Instructions  Medication Instructions:  Your physician recommends that you continue on your current  medications as directed. Please refer to the Current Medication list given to you today.  *If you need a refill on your cardiac medications before your next appointment, please call your pharmacy*   Lab Work: None ordered.   Testing/Procedures:  1) Your physician has requested that you have an echocardiogram. Echocardiography is a painless test that uses sound waves to create images of your heart. It provides your doctor with information about the size and shape of your heart and how well your hearts chambers and valves are working. This procedure takes approximately one hour. There are no restrictions for this procedure.  2) [facility] MYOVIEW   Your caregiver has ordered a Stress Test with nuclear imaging. The purpose of this test is to evaluate the blood supply to your heart muscle. This procedure is referred to as a "Non-Invasive Stress Test." This is because other than having an IV started in your vein, nothing is inserted or "invades" your body. Cardiac stress tests are done to find areas of poor blood flow to the heart by determining the extent of coronary artery disease (CAD). Some patients exercise on a treadmill, which naturally increases the blood flow to your heart, while others who are  unable to walk on a treadmill due to physical limitations have a pharmacologic/chemical stress agent called Lexiscan . This medicine will mimic walking on a treadmill by temporarily increasing your coronary blood flow.      PLEASE REPORT TO [facility] MEDICAL MALL ENTRANCE   THE VOLUNTEERS AT THE FIRST DESK WILL DIRECT YOU WHERE TO GO     *Please note: these test may take anywhere between 2-4 hours to complete       Date of Procedure:_____________________________________   Arrival Time for Procedure:______________________________    PLEASE NOTIFY THE OFFICE AT LEAST 24 HOURS IN ADVANCE IF YOU ARE UNABLE TO KEEP YOUR APPOINTMENT.  [phone]  PLEASE NOTIFY NUCLEAR MEDICINE AT [facility] AT LEAST 24 HOURS  IN ADVANCE IF YOU ARE UNABLE TO KEEP YOUR APPOINTMENT. [phone]         How to prepare for your Myoview test:   1. Do  not eat or drink after midnight  2. No caffeine for 24 hours prior to test  3. No smoking 24 hours prior to test.  4. Unless instructed otherwise, Take your medication with a small sips of water.    5.         Ladies, please do not wear dresses. Skirts or pants are appropriate. Please wear a short sleeve shirt.  6. No perfume, cologne or lotion.  7. Wear comfortable walking shoes. No heels!    Follow-Up: At [facility], you and your health needs are our priority.  As part of our continuing mission to provide you with exceptional heart care, we have created designated Provider Care Teams.  These Care Teams include your primary Cardiologist (physician) and Advanced Practice Providers (APPs -  Physician Assistants and Nurse Practitioners) who all work together to provide you with the care you need, when you need it.  We recommend signing up for the patient portal called "MyChart".  Sign up information is provided on this After Visit Summary.  MyChart is used to connect with patients for Virtual Visits (Telemedicine).  Patients are able to view lab/test results, encounter notes, upcoming appointments, etc.  Non-urgent messages can be sent to your provider as well.   To learn more about what you can do with MyChart, go to [email or web address].    Your next appointment:    Follow up AFTER Echo and Myoview  The format for your next appointment:   In Person  Provider:   Debbe Odea, MD        Signed, Debbe Odea, MD  02/16/2020 1:08 PM    [facility]

## 2020-03-11 ENCOUNTER — Other Ambulatory Visit: Payer: Self-pay

## 2020-03-11 ENCOUNTER — Encounter
Admission: RE | Admit: 2020-03-11 | Discharge: 2020-03-11 | Disposition: A | Discharge status: Home / Self Care | Payer: Medicare Other | Source: Ambulatory Visit | Attending: Cardiology | Admitting: Cardiology

## 2020-03-11 DIAGNOSIS — R072 Precordial pain: Secondary | ICD-10-CM | POA: Diagnosis not present

## 2020-03-11 LAB — NM MYOCAR MULTI W/SPECT W/WALL MOTION / EF
LV dias vol: 64 mL (ref 46–106)
LV sys vol: 18 mL
Peak HR: 101 {beats}/min
Percent HR: 62 %
Rest HR: 67 {beats}/min
SDS: 3
SRS: 11
SSS: 4
TID: 0.93

## 2020-03-11 MED ORDER — TECHNETIUM TC 99M TETROFOSMIN IV KIT
30.0000 | PACK | Freq: Once | INTRAVENOUS | Status: AC | PRN
  Administered 2020-03-11: 32.091 via INTRAVENOUS

## 2020-03-11 MED ORDER — TECHNETIUM TC 99M TETROFOSMIN IV KIT
10.4400 | PACK | Freq: Once | INTRAVENOUS | Status: AC | PRN
  Administered 2020-03-11: 10.44 via INTRAVENOUS

## 2020-03-11 MED ORDER — REGADENOSON 0.4 MG/5ML IV SOLN
0.4000 mg | Freq: Once | INTRAVENOUS | Status: AC
  Administered 2020-03-11: 0.4 mg via INTRAVENOUS

## 2020-03-13 ENCOUNTER — Other Ambulatory Visit: Payer: Self-pay

## 2020-03-13 ENCOUNTER — Ambulatory Visit (INDEPENDENT_AMBULATORY_CARE_PROVIDER_SITE_OTHER): Payer: Medicare Other

## 2020-03-13 DIAGNOSIS — R079 Chest pain, unspecified: Secondary | ICD-10-CM

## 2020-03-13 LAB — ECHOCARDIOGRAM COMPLETE
AR max vel: 2.4 cm2
AV Area VTI: 2.69 cm2
AV Area mean vel: 2.68 cm2
AV Mean grad: 3 mmHg
AV Peak grad: 6.9 mmHg
Ao pk vel: 1.31 m/s
Area-P 1/2: 3.27 cm2
Calc EF: 59.3 %
S' Lateral: 2.7 cm
Single Plane A2C EF: 60.2 %
Single Plane A4C EF: 55.4 %

## 2020-03-22 ENCOUNTER — Other Ambulatory Visit: Payer: Self-pay

## 2020-03-22 ENCOUNTER — Encounter: Payer: Self-pay | Admitting: Cardiology

## 2020-03-22 ENCOUNTER — Ambulatory Visit (INDEPENDENT_AMBULATORY_CARE_PROVIDER_SITE_OTHER): Payer: Medicare Other | Admitting: Cardiology

## 2020-03-22 VITALS — BP 116/86 | HR 87 | Ht 66.0 in | Wt 174.0 lb

## 2020-03-22 DIAGNOSIS — R079 Chest pain, unspecified: Secondary | ICD-10-CM | POA: Diagnosis not present

## 2020-03-22 DIAGNOSIS — I10 Essential (primary) hypertension: Secondary | ICD-10-CM

## 2020-03-22 NOTE — Patient Instructions (Signed)

## 2020-03-22 NOTE — Progress Notes (Signed)
Cardiology Office Note:    Date:  03/22/2020   ID:  Kristin Mcdowell, DOB [birth year], MRN [id number]  PCP:  Leanna Sato, MD  Cardiologist:  Debbe Odea, MD  Electrophysiologist:  None   Referring MD: Leanna Sato, MD   Chief Complaint  Patient presents with  . Other    Follow up post Testing.  Meds reviewed verbally with patient.     History of Present Illness:    Kristin Mcdowell is a 59 y.o. female with a hx of hypertension, GERD,  who presents for follow-up.  She was last seen due to chest pain ongoing for about 2 months.  Symptoms were associated with stress/exertion.  Patient is claustrophobic and states will not "tolerate cardiac CT.  Lexiscan Myoview ordered instead to evaluate ischemia.  Echo ordered to evaluate cardiac function.  She states symptoms of chest discomfort have improved.  Presents today for test results.   Prior notes History of palpitations, prior cardiac monitor showed no significant arrhythmias, an episode of 8 beats of nonsustained VT occurred not associated with symptoms.  Patient triggered events were associated with sinus rhythm.  She has no other cardiac symptoms.  A trial of beta-blocker was discussed previously but patient wanted to monitor symptoms of medications.   Past Medical History:  Diagnosis Date  . Depression   . GERD (gastroesophageal reflux disease)   . Hypertension   . Seasonal allergies     Past Surgical History:  Procedure Laterality Date  . ABDOMINAL HYSTERECTOMY    . COLONOSCOPY WITH PROPOFOL N/A 08/30/2015   Procedure: COLONOSCOPY WITH PROPOFOL;  Surgeon: Midge Minium, MD;  Location: [facility];  Service: Endoscopy;  Laterality: N/A;  . LUMBAR DISC SURGERY      Current Medications: Current Meds  Medication Sig  . acetaminophen (TYLENOL) 325 MG tablet Take 325 mg by mouth as needed.  Marland Kitchen amLODipine-atorvastatin (CADUET) 5-10 MG tablet Take by mouth.  . COD LIVER OIL PO Take by mouth daily.  .  cyclobenzaprine (FLEXERIL) 10 MG tablet Take 10 mg by mouth 2 (two) times daily as needed for muscle spasms.  Marland Kitchen docusate sodium (COLACE) 100 MG capsule Take 100 mg by mouth 2 (two) times daily as needed for mild constipation.  . lidocaine (LIDODERM) 5 % Place 1 patch onto the skin daily as needed. Remove & Discard patch within 12 hours or as directed by MD  . Multiple Vitamin (MULTIVITAMIN) capsule Take 1 capsule by mouth daily.  Marland Kitchen omeprazole (PRILOSEC) 20 MG capsule Take 20 mg by mouth daily.     Allergies:   Penicillins   Social History   Socioeconomic History  . Marital status: Single    Spouse name: Not on file  . Number of children: Not on file  . Years of education: Not on file  . Highest education level: Not on file  Occupational History  . Not on file  Tobacco Use  . Smoking status: Never Smoker  . Smokeless tobacco: Never Used  Vaping Use  . Vaping Use: Never used  Substance and Sexual Activity  . Alcohol use: No  . Drug use: Never  . Sexual activity: Not on file  Other Topics Concern  . Not on file  Social History Narrative  . Not on file   Social Determinants of Health   Financial Resource Strain: Not on file  Food Insecurity: Not on file  Transportation Needs: Not on file  Physical Activity: Not on file  Stress: Not on  file  Social Connections: Not on file     Family History: The patient's family history includes Heart disease in her mother; Stroke in her brother. There is no history of Breast cancer.  ROS:   Please see the history of present illness.     All other systems reviewed and are negative.  EKGs/Labs/Other Studies Reviewed:    The following studies were reviewed today:   EKG:  EKG not ordered today.   Recent Labs: 01/26/2020: ALT 16; BUN 17; Creatinine, Ser 1.15; Hemoglobin 12.0; Platelets 221; Potassium 3.8; Sodium 138  Recent Lipid Panel No results found for: CHOL, TRIG, HDL, CHOLHDL, VLDL, LDLCALC, LDLDIRECT  Physical Exam:     VS:  BP 116/86 (BP Location: Left Arm, Patient Position: Sitting, Cuff Size: Normal)   Pulse 87   Ht 5\' 6"  (1.676 m)   Wt 174 lb (78.9 kg)   SpO2 97%   BMI 28.08 kg/m     Wt Readings from Last 3 Encounters:  03/22/20 174 lb (78.9 kg)  02/16/20 174 lb (78.9 kg)  01/26/20 175 lb (79.4 kg)     GEN:  Well nourished, well developed in no acute distress HEENT: Normal NECK: No JVD; No carotid bruits LYMPHATICS: No lymphadenopathy CARDIAC: RRR, no murmurs, rubs, gallops RESPIRATORY:  Clear to auscultation without rales, wheezing or rhonchi  ABDOMEN: Soft, non-tender, non-distended MUSCULOSKELETAL:  No edema; No deformity  SKIN: Warm and dry NEUROLOGIC:  Alert and oriented x 3 PSYCHIATRIC:  Normal affect   ASSESSMENT:    1. Chest pain of uncertain etiology   2. Essential hypertension    PLAN:    In order of problems listed above:  1. Patient with symptoms of chest pain.  Risk factors of hypertension.  Echocardiogram showed normal systolic and diastolic function, EF 60 to 65%.  Lexiscan Myoview with no evidence for ischemia.  Patient made aware of results and reassured. 2. History of hypertension, BP controlled, continue amlodipine.  Follow-up in 6-12 months  This note was generated in part or whole with voice recognition software. Voice recognition is usually quite accurate but there are transcription errors that can and very often do occur. I apologize for any typographical errors that were not detected and corrected.  Medication Adjustments/Labs and Tests Ordered: Current medicines are reviewed at length with the patient today.  Concerns regarding medicines are outlined above.  No orders of the defined types were placed in this encounter.  No orders of the defined types were placed in this encounter.    Patient Instructions  Medication Instructions:  Your physician recommends that you continue on your current medications as directed. Please refer to the Current  Medication list given to you today.  *If you need a refill on your cardiac medications before your next appointment, please call your pharmacy*   Lab Work: None Ordered If you have labs (blood work) drawn today and your tests are completely normal, you will receive your results only by: 8-12 MyChart Message (if you have MyChart) OR . A paper copy in the mail If you have any lab test that is abnormal or we need to change your treatment, we will call you to review the results.   Testing/Procedures: None Ordered   Follow-Up: At [facility], you and your health needs are our priority.  As part of our continuing mission to provide you with exceptional heart care, we have created designated Provider Care Teams.  These Care Teams include your primary Cardiologist (physician) and Advanced Practice Providers (  APPs -  Physician Assistants and Nurse Practitioners) who all work together to provide you with the care you need, when you need it.  We recommend signing up for the patient portal called "MyChart".  Sign up information is provided on this After Visit Summary.  MyChart is used to connect with patients for Virtual Visits (Telemedicine).  Patients are able to view lab/test results, encounter notes, upcoming appointments, etc.  Non-urgent messages can be sent to your provider as well.   To learn more about what you can do with MyChart, go to [email or web address].    Your next appointment:   6 month(s)  The format for your next appointment:   In Person  Provider:   Debbe Odea, MD   Other Instructions      Signed, Debbe Odea, MD  03/22/2020 12:55 PM    [facility]

## 2020-04-10 ENCOUNTER — Encounter: Payer: Self-pay | Admitting: Gastroenterology

## 2020-04-10 ENCOUNTER — Other Ambulatory Visit: Payer: Self-pay

## 2020-04-10 ENCOUNTER — Ambulatory Visit (INDEPENDENT_AMBULATORY_CARE_PROVIDER_SITE_OTHER): Payer: Medicare Other | Admitting: Gastroenterology

## 2020-04-10 VITALS — BP 113/79 | HR 80 | Temp 98.3°F | Ht 66.0 in | Wt 171.6 lb

## 2020-04-10 DIAGNOSIS — Z1211 Encounter for screening for malignant neoplasm of colon: Secondary | ICD-10-CM | POA: Diagnosis not present

## 2020-04-10 DIAGNOSIS — K5900 Constipation, unspecified: Secondary | ICD-10-CM | POA: Diagnosis not present

## 2020-04-10 NOTE — Progress Notes (Signed)
Kristin Bellows MD, MRCP(U.K) [address]  [place]  [place], [place] [address]  Main: [phone]  Fax: [phone]   Gastroenterology Consultation  Referring Provider:     Marguerita Merles, MD Primary Care Physician:  Kristin Merles, MD Primary Gastroenterologist:  Dr. Jonathon Mcdowell  Reason for Consultation:     Bloating , colon cancer screening         HPI:   Kristin Mcdowell is a 60 y.o. y/o female referred for consultation & management  by Dr. Lennox Mcdowell, Kristin Burkitt, MD.     05/02/2019: CT scan of the abdomen pelvis with contrast: Uterus absent.  No abdominal abnormalities. 01/26/2020: Creatinine 1.15, hemoglobin 12.0.  She complains of abdominal distention and bloating for the past few years.  Does not have a bowel movement daily.  This has been ongoing for many years.  She can go up to 4 weeks without a bowel movement.  Usually does not have it for at least a couple of days and she takes supplements only laxative pill which gives her a less than satisfying bowel movement which is usually rockhard.  At times when she has a good bowel movement she feels much better.  Denies any blood in the stool.  Recall the last colonoscopy was 10 years back.  No family history of colon cancer or polyps.  She lost some weight after her Covid diagnosis. ;  Past Medical History:  Diagnosis Date  . Depression   . GERD (gastroesophageal reflux disease)   . Hypertension   . Seasonal allergies     Past Surgical History:  Procedure Laterality Date  . ABDOMINAL HYSTERECTOMY    . COLONOSCOPY WITH PROPOFOL N/A 08/30/2015   Procedure: COLONOSCOPY WITH PROPOFOL;  Surgeon: Kristin Lame, MD;  Location: [facility];  Service: Endoscopy;  Laterality: N/A;  . LUMBAR DISC SURGERY      Prior to Admission medications   Medication Sig Start Date End Date Taking? Authorizing Provider  acetaminophen (TYLENOL) 325 MG tablet Take 325 mg by mouth as needed.    [provider]  amLODipine-atorvastatin  (CADUET) 5-10 MG tablet Take by mouth.    [provider]  COD LIVER OIL PO Take by mouth daily.    [provider]  cyclobenzaprine (FLEXERIL) 10 MG tablet Take 10 mg by mouth 2 (two) times daily as needed for muscle spasms.    [provider]  docusate sodium (COLACE) 100 MG capsule Take 100 mg by mouth 2 (two) times daily as needed for mild constipation.    [provider]  lidocaine (LIDODERM) 5 % Place 1 patch onto the skin daily as needed. Remove & Discard patch within 12 hours or as directed by MD    [provider]  Multiple Vitamin (MULTIVITAMIN) capsule Take 1 capsule by mouth daily.    [provider]  omeprazole (PRILOSEC) 20 MG capsule Take 20 mg by mouth daily.    [provider]    Family History  Problem Relation Age of Onset  . Heart disease Mother   . Stroke Brother   . Breast cancer Neg Hx      Social History   Tobacco Use  . Smoking status: Never Smoker  . Smokeless tobacco: Never Used  Vaping Use  . Vaping Use: Never used  Substance Use Topics  . Alcohol use: No  . Drug use: Never    Allergies as of 04/10/2020 - Review Complete 03/22/2020  Allergen Reaction Noted  .  Penicillins  09/23/2012    Review of Systems:    All systems reviewed and negative except where noted in HPI.   Physical Exam:  There were no vitals taken for this visit. No LMP recorded. Patient has had a hysterectomy. Psych:  Alert and cooperative. Normal mood and affect. General:   Alert,  Well-developed, well-nourished, pleasant and cooperative in NAD Head:  Normocephalic and atraumatic. Eyes:  Sclera clear, no icterus.   Conjunctiva pink. Ears:  Normal auditory acuity. Lungs:  Respirations even and unlabored.  Clear throughout to auscultation.   No wheezes, crackles, or rhonchi. No acute distress. Heart:  Regular rate and rhythm; no murmurs, clicks, rubs, or gallops. Abdomen:  Normal bowel sounds.  No bruits.  Soft,  non-tender and non-distended without masses, hepatosplenomegaly or hernias noted.  No guarding or rebound tenderness.    Neurologic:  Alert and oriented x3;  grossly normal neurologically. Psych:  Alert and cooperative. Normal mood and affect.  Imaging Studies: NM Myocar Multi W/Spect W/Wall Motion / EF  Result Date: 03/11/2020  There was no ST segment deviation noted during stress.  No T wave inversion was noted during stress.  The study is normal.  This is a low risk study.  The left ventricular ejection fraction is hyperdynamic (>65%).  CT attenuation images show no evidence of aortic or coronary calcifications.    ECHOCARDIOGRAM COMPLETE  Result Date: 03/13/2020    ECHOCARDIOGRAM REPORT   Patient Name:   Kristin Mcdowell Date of Exam: 03/13/2020 Medical Rec #:  [id number]        Height:       65.0 in Accession #:    [id number]       Weight:       174.0 lb Date of Birth:  1960/10/15       BSA:          1.864 m Patient Age:    59 years         BP:           122/80 mmHg Patient Gender: F                HR:           74 bpm. Exam Location:  [facility] Procedure: 3D Echo, Cardiac Doppler and Color Doppler Indications:    R07.9* Chest pain, unspecified  History:        Patient has no prior history of Echocardiogram examinations.                 Signs/Symptoms:Chest Pain and Weakness; Risk Factors:Non-Smoker                 and Hypertension. GERD.  Sonographer:    Quentin Ore RDMS, RVT, RDCS Referring Phys: [id number] BRIAN AGBOR-ETANG IMPRESSIONS  1. Left ventricular ejection fraction, by estimation, is 60 to 65%. The left ventricle has normal function. The left ventricle has no regional wall motion abnormalities. Left ventricular diastolic parameters were normal.  2. Right ventricular systolic function is normal. The right ventricular size is normal.  3. The mitral valve is normal in structure. No evidence of mitral valve regurgitation. No evidence of mitral stenosis.  4. The aortic valve is tricuspid.  Aortic valve regurgitation is not visualized. Mild aortic valve sclerosis is present, with no evidence of aortic valve stenosis.  5. The inferior vena cava is normal in size with greater than 50% respiratory variability, suggesting right atrial pressure of 3 mmHg. FINDINGS  Left Ventricle: Left ventricular  ejection fraction, by estimation, is 60 to 65%. The left ventricle has normal function. The left ventricle has no regional wall motion abnormalities. The left ventricular internal cavity size was normal in size. There is  no left ventricular hypertrophy. Left ventricular diastolic parameters were normal. Right Ventricle: The right ventricular size is normal. No increase in right ventricular wall thickness. Right ventricular systolic function is normal. Left Atrium: Left atrial size was normal in size. Right Atrium: Right atrial size was normal in size. Pericardium: There is no evidence of pericardial effusion. Mitral Valve: The mitral valve is normal in structure. No evidence of mitral valve regurgitation. No evidence of mitral valve stenosis. Tricuspid Valve: The tricuspid valve is normal in structure. Tricuspid valve regurgitation is not demonstrated. No evidence of tricuspid stenosis. Aortic Valve: The aortic valve is tricuspid. Aortic valve regurgitation is not visualized. Mild aortic valve sclerosis is present, with no evidence of aortic valve stenosis. Aortic valve mean gradient measures 3.0 mmHg. Aortic valve peak gradient measures 6.9 mmHg. Aortic valve area, by VTI measures 2.69 cm. Pulmonic Valve: The pulmonic valve was not well visualized. Pulmonic valve regurgitation is not visualized. No evidence of pulmonic stenosis. Aorta: The aortic root is normal in size and structure. Venous: The inferior vena cava is normal in size with greater than 50% respiratory variability, suggesting right atrial pressure of 3 mmHg. IAS/Shunts: No atrial level shunt detected by color flow Doppler.  LEFT VENTRICLE PLAX 2D  LVIDd:         4.50 cm     Diastology LVIDs:         2.70 cm     LV e' medial:    7.18 cm/s LV PW:         0.80 cm     LV E/e' medial:  11.1 LV IVS:        0.90 cm     LV e' lateral:   8.49 cm/s LVOT diam:     1.85 cm     LV E/e' lateral: 9.4 LV SV:         67 LV SV Index:   36 LVOT Area:     2.69 cm  LV Volumes (MOD) LV vol d, MOD A2C: 63.6 ml LV vol d, MOD A4C: 65.7 ml LV vol s, MOD A2C: 25.3 ml LV vol s, MOD A4C: 29.3 ml LV SV MOD A2C:     38.3 ml LV SV MOD A4C:     65.7 ml LV SV MOD BP:      40.8 ml RIGHT VENTRICLE             IVC RV S prime:     18.50 cm/s  IVC diam: 1.70 cm TAPSE (M-mode): 3.2 cm LEFT ATRIUM             Index       RIGHT ATRIUM           Index LA diam:        3.00 cm 1.61 cm/m  RA Area:     12.40 cm LA Vol (A2C):   52.6 ml 28.21 ml/m RA Volume:   29.50 ml  15.82 ml/m LA Vol (A4C):   42.7 ml 22.90 ml/m LA Biplane Vol: 47.6 ml 25.53 ml/m  AORTIC VALVE                   PULMONIC VALVE AV Area (Vmax):    2.40 cm    PV Vmax:  1.04 m/s AV Area (Vmean):   2.68 cm    PV Peak grad:  4.3 mmHg AV Area (VTI):     2.69 cm AV Vmax:           131.00 cm/s AV Vmean:          84.400 cm/s AV VTI:            0.249 m AV Peak Grad:      6.9 mmHg AV Mean Grad:      3.0 mmHg LVOT Vmax:         117.00 cm/s LVOT Vmean:        84.300 cm/s LVOT VTI:          0.249 m LVOT/AV VTI ratio: 1.00  AORTA Ao Root diam: 2.60 cm Ao Asc diam:  2.80 cm Ao Arch diam: 2.7 cm MITRAL VALVE               TRICUSPID VALVE MV Area (PHT): 3.27 cm    TR Peak grad:   22.8 mmHg MV Decel Time: 232 msec    TR Vmax:        239.00 cm/s MV E velocity: 79.70 cm/s MV A velocity: 97.30 cm/s  SHUNTS MV E/A ratio:  0.82        Systemic VTI:  0.25 m                            Systemic Diam: 1.85 cm Debbe Odea MD Electronically signed by Debbe Odea MD Signature Date/Time: 03/13/2020/4:09:30 PM    Final     Assessment and Plan:   Kristin Mcdowell is a 60 y.o. y/o female has been referred for referred for abdominal bloating.   History suggestive of severe constipation.  New for colon cancer screening.  Average risk.  Plan 1.  Constipation: High-fiber diet.  Patient information provided.  Commence on Trulance daily.  Samples provided. 2.  Colonoscopy   I have discussed alternative options, risks & benefits,  which include, but are not limited to, bleeding, infection, perforation,respiratory complication & drug reaction.  The patient agrees with this plan & written consent will be obtained.      Follow up in 3 months  Dr Wyline Mood MD,MRCP(U.K)

## 2020-04-25 ENCOUNTER — Other Ambulatory Visit: Payer: Self-pay

## 2020-04-25 DIAGNOSIS — Z20822 Contact with and (suspected) exposure to covid-19: Secondary | ICD-10-CM

## 2020-04-27 LAB — SARS-COV-2, NAA 2 DAY TAT

## 2020-04-27 LAB — NOVEL CORONAVIRUS, NAA: SARS-CoV-2, NAA: NOT DETECTED

## 2020-05-15 ENCOUNTER — Other Ambulatory Visit: Admission: RE | Admit: 2020-05-15 | Payer: Medicare Other | Source: Ambulatory Visit

## 2020-05-31 ENCOUNTER — Inpatient Hospital Stay: Payer: Medicare Other | Attending: Oncology

## 2020-05-31 DIAGNOSIS — D709 Neutropenia, unspecified: Secondary | ICD-10-CM | POA: Insufficient documentation

## 2020-05-31 DIAGNOSIS — R748 Abnormal levels of other serum enzymes: Secondary | ICD-10-CM

## 2020-05-31 LAB — CBC WITH DIFFERENTIAL/PLATELET
Abs Immature Granulocytes: 0 10*3/uL (ref 0.00–0.07)
Basophils Absolute: 0 10*3/uL (ref 0.0–0.1)
Basophils Relative: 0 %
Eosinophils Absolute: 0 10*3/uL (ref 0.0–0.5)
Eosinophils Relative: 1 %
HCT: 37.7 % (ref 36.0–46.0)
Hemoglobin: 12.2 g/dL (ref 12.0–15.0)
Immature Granulocytes: 0 %
Lymphocytes Relative: 38 %
Lymphs Abs: 1.5 10*3/uL (ref 0.7–4.0)
MCH: 26.8 pg (ref 26.0–34.0)
MCHC: 32.4 g/dL (ref 30.0–36.0)
MCV: 82.9 fL (ref 80.0–100.0)
Monocytes Absolute: 0.5 10*3/uL (ref 0.1–1.0)
Monocytes Relative: 14 %
Neutro Abs: 1.8 10*3/uL (ref 1.7–7.7)
Neutrophils Relative %: 47 %
Platelets: 255 10*3/uL (ref 150–400)
RBC: 4.55 MIL/uL (ref 3.87–5.11)
RDW: 13.6 % (ref 11.5–15.5)
WBC: 3.9 10*3/uL — ABNORMAL LOW (ref 4.0–10.5)
nRBC: 0 % (ref 0.0–0.2)

## 2020-06-12 ENCOUNTER — Other Ambulatory Visit: Payer: Self-pay

## 2020-06-12 ENCOUNTER — Other Ambulatory Visit
Admission: RE | Admit: 2020-06-12 | Discharge: 2020-06-12 | Disposition: A | Discharge status: Home / Self Care | Payer: Medicare Other | Source: Ambulatory Visit | Attending: Gastroenterology | Admitting: Gastroenterology

## 2020-06-12 DIAGNOSIS — Z01812 Encounter for preprocedural laboratory examination: Secondary | ICD-10-CM | POA: Insufficient documentation

## 2020-06-12 DIAGNOSIS — Z20822 Contact with and (suspected) exposure to covid-19: Secondary | ICD-10-CM | POA: Diagnosis not present

## 2020-06-12 LAB — SARS CORONAVIRUS 2 (TAT 6-24 HRS): SARS Coronavirus 2: NEGATIVE

## 2020-06-14 ENCOUNTER — Ambulatory Visit
Admission: RE | Admit: 2020-06-14 | Discharge: 2020-06-14 | Disposition: A | Discharge status: Home / Self Care | Payer: Medicare Other | Attending: Gastroenterology | Admitting: Gastroenterology

## 2020-06-14 ENCOUNTER — Encounter
Admission: RE | Disposition: A | Discharge status: Home / Self Care | Payer: Self-pay | Source: Home / Self Care | Attending: Gastroenterology

## 2020-06-14 ENCOUNTER — Ambulatory Visit: Payer: Medicare Other | Admitting: Certified Registered Nurse Anesthetist

## 2020-06-14 ENCOUNTER — Other Ambulatory Visit: Payer: Self-pay

## 2020-06-14 ENCOUNTER — Encounter: Payer: Self-pay | Admitting: Gastroenterology

## 2020-06-14 DIAGNOSIS — Z79899 Other long term (current) drug therapy: Secondary | ICD-10-CM | POA: Insufficient documentation

## 2020-06-14 DIAGNOSIS — Z1211 Encounter for screening for malignant neoplasm of colon: Secondary | ICD-10-CM | POA: Diagnosis present

## 2020-06-14 DIAGNOSIS — Z880 Allergy status to penicillin: Secondary | ICD-10-CM | POA: Diagnosis not present

## 2020-06-14 HISTORY — PX: COLONOSCOPY WITH PROPOFOL: SHX5780

## 2020-06-14 SURGERY — COLONOSCOPY WITH PROPOFOL
Anesthesia: General

## 2020-06-14 MED ORDER — PROPOFOL 10 MG/ML IV BOLUS
INTRAVENOUS | Status: DC | PRN
  Administered 2020-06-14: 80 mg via INTRAVENOUS

## 2020-06-14 MED ORDER — SODIUM CHLORIDE 0.9 % IV SOLN: INTRAVENOUS | Status: DC

## 2020-06-14 MED ORDER — PROPOFOL 500 MG/50ML IV EMUL
INTRAVENOUS | Status: DC | PRN
  Administered 2020-06-14: 160 ug/kg/min via INTRAVENOUS

## 2020-06-14 NOTE — H&P (Signed)
Wyline Mood, MD [address], [address], [place], [place], [address] [address], [address], [place], [place], [address] Phone: [phone]  Fax: [phone]  Primary Care Physician:  Leanna Sato, MD   Pre-Procedure History & Physical: HPI:  LADDIE NAEEM is a 60 y.o. female is here for an colonoscopy.   Past Medical History:  Diagnosis Date  . Depression   . GERD (gastroesophageal reflux disease)   . Hypertension   . Seasonal allergies     Past Surgical History:  Procedure Laterality Date  . ABDOMINAL HYSTERECTOMY    . COLONOSCOPY WITH PROPOFOL N/A 08/30/2015   Procedure: COLONOSCOPY WITH PROPOFOL;  Surgeon: Midge Minium, MD;  Location: [facility];  Service: Endoscopy;  Laterality: N/A;  . LUMBAR DISC SURGERY      Prior to Admission medications   Medication Sig Start Date End Date Taking? Authorizing Provider  acetaminophen (TYLENOL) 325 MG tablet Take 325 mg by mouth as needed.    [provider]  amLODipine-atorvastatin (CADUET) 5-10 MG tablet Take by mouth.    [provider]  COD LIVER OIL PO Take by mouth daily.    [provider]  cyclobenzaprine (FLEXERIL) 10 MG tablet Take 10 mg by mouth 2 (two) times daily as needed for muscle spasms.    [provider]  docusate sodium (COLACE) 100 MG capsule Take 100 mg by mouth 2 (two) times daily as needed for mild constipation.    [provider]  lidocaine (LIDODERM) 5 % Place 1 patch onto the skin daily as needed. Remove & Discard patch within 12 hours or as directed by MD    [provider]  Multiple Vitamin (MULTIVITAMIN) capsule Take 1 capsule by mouth daily.    [provider]  omeprazole (PRILOSEC) 20 MG capsule Take 20 mg by mouth daily.    [provider]    Allergies as of 04/10/2020 - Review Complete 04/10/2020  Allergen Reaction Noted  . Penicillins  09/23/2012    Family History  Problem Relation Age of Onset  . Heart  disease Mother   . Stroke Brother   . Breast cancer Neg Hx     Social History   Socioeconomic History  . Marital status: Single    Spouse name: Not on file  . Number of children: Not on file  . Years of education: Not on file  . Highest education level: Not on file  Occupational History  . Not on file  Tobacco Use  . Smoking status: Never Smoker  . Smokeless tobacco: Never Used  Vaping Use  . Vaping Use: Never used  Substance and Sexual Activity  . Alcohol use: No  . Drug use: Never  . Sexual activity: Not on file  Other Topics Concern  . Not on file  Social History Narrative  . Not on file   Social Determinants of Health   Financial Resource Strain: Not on file  Food Insecurity: Not on file  Transportation Needs: Not on file  Physical Activity: Not on file  Stress: Not on file  Social Connections: Not on file  Intimate Partner Violence: Not on file    Review of Systems: See HPI, otherwise negative ROS  Physical Exam: There were no vitals taken for this visit. General:   Alert,  pleasant and cooperative in NAD Head:  Normocephalic and atraumatic. Neck:  Supple; no masses or thyromegaly. Lungs:  Clear throughout to auscultation, normal respiratory effort.    Heart:  +  S1, +S2, Regular rate and rhythm, No edema. Abdomen:  Soft, nontender and nondistended. Normal bowel sounds, without guarding, and without rebound.   Neurologic:  Alert and  oriented x4;  grossly normal neurologically.  Impression/Plan: SHEKERA BEAVERS is here for an colonoscopy to be performed for Screening colonoscopy average risk   Risks, benefits, limitations, and alternatives regarding  colonoscopy have been reviewed with the patient.  Questions have been answered.  All parties agreeable.   Wyline Mood, MD  06/14/2020, 8:43 AM

## 2020-06-14 NOTE — Transfer of Care (Signed)
Immediate Anesthesia Transfer of Care Note  Patient: Kristin Mcdowell  Procedure(s) Performed: COLONOSCOPY WITH PROPOFOL (N/A )  Patient Location: PACU  Anesthesia Type:General  Level of Consciousness: awake and alert   Airway & Oxygen Therapy: Patient Spontanous Breathing  Post-op Assessment: Report given to RN and Post -op Vital signs reviewed and stable  Post vital signs: Reviewed and stable  Last Vitals:  Vitals Value Taken Time  BP    Temp    Pulse 83 06/14/20 0927  Resp 14 06/14/20 0927  SpO2 97 % 06/14/20 0927  Vitals shown include unvalidated device data.  Last Pain:  Vitals:   06/14/20 0855  TempSrc: Temporal  PainSc: 0-No pain         Complications: No complications documented.

## 2020-06-14 NOTE — Anesthesia Postprocedure Evaluation (Signed)
Anesthesia Post Note  Patient: Kristin Mcdowell  Procedure(s) Performed: COLONOSCOPY WITH PROPOFOL (N/A )  Patient location during evaluation: Endoscopy Anesthesia Type: General Level of consciousness: awake and alert Pain management: pain level controlled Vital Signs Assessment: post-procedure vital signs reviewed and stable Respiratory status: spontaneous breathing and respiratory function stable Cardiovascular status: stable Anesthetic complications: no   No complications documented.   Last Vitals:  Vitals:   06/14/20 0926 06/14/20 0946  BP: 121/88 (!) 123/94  Pulse:    Resp:    Temp: (!) 36.2 C   SpO2:      Last Pain:  Vitals:   06/14/20 0956  TempSrc:   PainSc: 0-No pain                 Ernst Cumpston K

## 2020-06-14 NOTE — Anesthesia Preprocedure Evaluation (Signed)
Anesthesia Evaluation  Patient identified by MRN, date of birth, ID band Patient awake    Reviewed: Allergy & Precautions, H&P , NPO status   Airway Mallampati: II  TM Distance: >3 FB Neck ROM: full    Dental   Pulmonary neg pulmonary ROS,    breath sounds clear to auscultation       Cardiovascular hypertension,  Rhythm:regular Rate:Normal     Neuro/Psych PSYCHIATRIC DISORDERS Depression negative neurological ROS     GI/Hepatic Neg liver ROS, GERD  ,  Endo/Other  negative endocrine ROS  Renal/GU negative Renal ROS     Musculoskeletal   Abdominal   Peds negative pediatric ROS (+)  Hematology negative hematology ROS (+)   Anesthesia Other Findings Past Medical History: No date: Depression No date: GERD (gastroesophageal reflux disease) No date: Hypertension No date: Seasonal allergies  Reproductive/Obstetrics                             Anesthesia Physical  Anesthesia Plan  ASA: II  Anesthesia Plan: General   Post-op Pain Management:    Induction: Intravenous  PONV Risk Score and Plan: Propofol infusion  Airway Management Planned:   Additional Equipment:   Intra-op Plan:   Post-operative Plan:   Informed Consent: I have reviewed the patients History and Physical, chart, labs and discussed the procedure including the risks, benefits and alternatives for the proposed anesthesia with the patient or authorized representative who has indicated his/her understanding and acceptance.       Plan Discussed with: CRNA  Anesthesia Plan Comments:         Anesthesia Quick Evaluation

## 2020-06-14 NOTE — Op Note (Addendum)
[facility] Gastroenterology Patient Name: Kristin Mcdowell Procedure Date: 06/14/2020 8:40 AM MRN: [id number] Account #: [id number] Date of Birth: 1960-09-18 Admit Type: Outpatient Age: 60 Room: [facility] ENDO ROOM 2 Gender: Female Note Status: Finalized Procedure:             Colonoscopy Indications:           Screening for colorectal malignant neoplasm Providers:             Wyline Mood MD, MD Referring MD:          Leanna Sato, MD (Referring MD) Medicines:             Monitored Anesthesia Care Complications:         No immediate complications. Procedure:             Pre-Anesthesia Assessment:                        - Prior to the procedure, a History and Physical was                         performed, and patient medications, allergies and                         sensitivities were reviewed. The patient's tolerance                         of previous anesthesia was reviewed.                        - The risks and benefits of the procedure and the                         sedation options and risks were discussed with the                         patient. All questions were answered and informed                         consent was obtained.                        - ASA Grade Assessment: II - A patient with mild                         systemic disease.                        After obtaining informed consent, the colonoscope was                         passed under direct vision. Throughout the procedure,                         the patient's blood pressure, pulse, and oxygen                         saturations were monitored continuously. The                         Colonoscope was introduced through the anus  with the                         intention of advancing to the cecum. The scope was                         advanced to the transverse colon before the procedure                         was aborted. Medications were given. The colonoscopy                          was performed with ease. The patient tolerated the                         procedure well. The quality of the bowel preparation                         was inadequate. Findings:      The perianal and digital rectal examinations were normal.      A large amount of semi-solid stool was found in the rectum, in the       sigmoid colon and in the descending colon, interfering with       visualization. Impression:            - Preparation of the colon was inadequate.                        - Stool in the rectum, in the sigmoid colon and in the                         descending colon.                        - No specimens collected. Recommendation:        - Discharge patient to home (with escort).                        - Resume previous diet.                        - Continue present medications.                        - Repeat colonoscopy in 4 weeks because the bowel                         preparation was suboptimal. Procedure Code(s):     --- Professional ---                        [phone], 53, Colonoscopy, flexible; diagnostic,                         including collection of specimen(s) by brushing or                         washing, when performed (separate procedure) Diagnosis Code(s):     --- Professional ---  Z12.11, Encounter for screening for malignant neoplasm                         of colon CPT copyright 2019 American Medical Association. All rights reserved. The codes documented in this report are preliminary and upon coder review may  be revised to meet current compliance requirements. Wyline Mood, MD Wyline Mood MD, MD 06/14/2020 9:22:39 AM This report has been signed electronically. Number of Addenda: 0 Note Initiated On: 06/14/2020 8:40 AM Total Procedure Duration: 0 hours 2 minutes 24 seconds  Estimated Blood Loss:  Estimated blood loss: none.      [facility]

## 2020-07-10 ENCOUNTER — Encounter: Payer: Self-pay | Admitting: Gastroenterology

## 2020-07-10 ENCOUNTER — Ambulatory Visit (INDEPENDENT_AMBULATORY_CARE_PROVIDER_SITE_OTHER): Payer: Medicare Other | Admitting: Gastroenterology

## 2020-07-10 ENCOUNTER — Other Ambulatory Visit: Payer: Self-pay

## 2020-07-10 VITALS — BP 133/83 | HR 75 | Temp 98.4°F | Ht 66.0 in | Wt 170.0 lb

## 2020-07-10 DIAGNOSIS — Z1211 Encounter for screening for malignant neoplasm of colon: Secondary | ICD-10-CM

## 2020-07-10 DIAGNOSIS — K5900 Constipation, unspecified: Secondary | ICD-10-CM

## 2020-07-10 DIAGNOSIS — R1310 Dysphagia, unspecified: Secondary | ICD-10-CM | POA: Insufficient documentation

## 2020-07-10 MED ORDER — TRULANCE 3 MG PO TABS: 3.0000 mg | ORAL_TABLET | Freq: Once | ORAL | 3 refills | Status: AC

## 2020-07-10 MED ORDER — PEG 3350-KCL-NA BICARB-NACL 420 G PO SOLR: ORAL | 0 refills | Status: DC

## 2020-07-10 NOTE — Progress Notes (Signed)
Wyline Mood MD, MRCP(U.K) [address]  [address]  [place], [place] [address]  Main: [phone]  Fax: [phone]   Primary Care Physician: Leanna Sato, MD  Primary Gastroenterologist:  Dr. Wyline Mood   Follow-up for constipation  HPI: Kristin Mcdowell is a 60 y.o. female   Summary of history :  Initially referred and seen in January 2022 for colon cancer screening and bloating ongoing for past few years.  CT scan of the abdomen in January 2021 showed no abdominal abnormalities.  She could go up to 4 weeks without a bowel movement.  She felt much better when she had a good bowel movement.  Commenced on a high-fiber diet along with Trulance samples of which were provided.  Interval history 04/10/2020-07/10/2020  06/14/2020: Colonoscopy: Stool is seen in the rectum sigmoid colon and descending colon hence procedure was inadequate  She ran out of her Trulance samples which was working well and stopped taking it.  Now has recurrence of bloating and abdominal distention.  She also complains of dysphagia ongoing for a few months on and off.  Denies any heartburn.  No prior endoscopy.  No weight loss.  Takes solids more than liquids.  Current Outpatient Medications  Medication Sig Dispense Refill  . acetaminophen (TYLENOL) 325 MG tablet Take 325 mg by mouth as needed.    Marland Kitchen amLODipine-atorvastatin (CADUET) 5-10 MG tablet Take by mouth.    . COD LIVER OIL PO Take by mouth daily.    . cyclobenzaprine (FLEXERIL) 10 MG tablet Take 10 mg by mouth 2 (two) times daily as needed for muscle spasms.    Marland Kitchen docusate sodium (COLACE) 100 MG capsule Take 100 mg by mouth 2 (two) times daily as needed for mild constipation.    . lidocaine (LIDODERM) 5 % Place 1 patch onto the skin daily as needed. Remove & Discard patch within 12 hours or as directed by MD    . Multiple Vitamin (MULTIVITAMIN) capsule Take 1 capsule by mouth daily.    Marland Kitchen omeprazole (PRILOSEC) 20 MG capsule Take 20 mg by mouth daily.  (Patient not taking: Reported on 06/14/2020)     No current facility-administered medications for this visit.    Allergies as of 07/10/2020 - Review Complete 07/10/2020  Allergen Reaction Noted  . Penicillins  09/23/2012    ROS:  General: Negative for anorexia, weight loss, fever, chills, fatigue, weakness. ENT: Negative for hoarseness, difficulty swallowing , nasal congestion. CV: Negative for chest pain, angina, palpitations, dyspnea on exertion, peripheral edema.  Respiratory: Negative for dyspnea at rest, dyspnea on exertion, cough, sputum, wheezing.  GI: See history of present illness. GU:  Negative for dysuria, hematuria, urinary incontinence, urinary frequency, nocturnal urination.  Endo: Negative for unusual weight change.    Physical Examination:   BP 133/83   Pulse 75   Temp 98.4 F (36.9 C) (Oral)   Ht 5\' 6"  (1.676 m)   Wt 170 lb (77.1 kg)   BMI 27.44 kg/m   General: Well-nourished, well-developed in no acute distress.  Eyes: No icterus. Conjunctivae pink. Mouth: Oropharyngeal mucosa moist and pink , no lesions erythema or exudate. Lungs: Clear to auscultation bilaterally. Non-labored. Heart: Regular rate and rhythm, no murmurs rubs or gallops.  Abdomen: Bowel sounds are normal, nontender, nondistended, no hepatosplenomegaly or masses, no abdominal bruits or hernia , no rebound or guarding.   Extremities: No lower extremity edema. No clubbing or deformities. Neuro: Alert and oriented x 3.  Grossly intact. Skin: Warm  and dry, no jaundice.   Psych: Alert and cooperative, normal mood and affect.   Imaging Studies: No results found.  Assessment and Plan:   Kristin Mcdowell is a 60 y.o. y/o female is here today to see me for follow-up of severe constipation.  Last visit was commenced on high-fiber diet along with Trulance.  Colonoscopy was incomplete as she had solid stool in her colon.   Plan 1.  Repeat colonoscopy with a 2-day prep and provide prescription  for 2 gallons of GoLYTELY.  After the first gallon has been taken continue taking the second gallon till the stool output which is the color of apple juice which is transparent and clear.   2.  EGD to be performed at the same time of colonoscopy for dysphagia.  Commence on Prilosec which she already has  3.  We will provide a 90-day prescription of Trulance which has worked in the past.  Needed we can add prep MiraLAX in addition.   I have discussed alternative options, risks & benefits,  which include, but are not limited to, bleeding, infection, perforation,respiratory complication & drug reaction.  The patient agrees with this plan & written consent will be obtained.       Dr Wyline Mood  MD,MRCP [facility]) Follow up in 12 weeks

## 2020-07-15 ENCOUNTER — Telehealth: Payer: Self-pay | Admitting: Gastroenterology

## 2020-07-15 NOTE — Telephone Encounter (Signed)
Patient asks for call back.  She has questions about the prep that was called in.

## 2020-07-16 NOTE — Telephone Encounter (Signed)
If she had the same prep last time - it didn't work and she had a lot of stool. Suggest strongly 2 days prep otherwise , if absolutely doesn't want it then a bottle of magnesium citrate to be taken day before starting suprep. Inform high probability that she wont be clean with it and may not end up completing colonoscopy

## 2020-07-16 NOTE — Telephone Encounter (Signed)
Yes , EGD is to be done at the same time as per my office note for dysphagia

## 2020-07-17 ENCOUNTER — Other Ambulatory Visit: Payer: Self-pay

## 2020-07-17 MED ORDER — NA SULFATE-K SULFATE-MG SULF 17.5-3.13-1.6 GM/177ML PO SOLN: 1.0000 | Freq: Once | ORAL | 0 refills | Status: AC

## 2020-07-22 ENCOUNTER — Ambulatory Visit
Admission: RE | Admit: 2020-07-22 | Discharge: 2020-07-22 | Disposition: A | Discharge status: Home / Self Care | Payer: Medicare Other | Attending: Gastroenterology | Admitting: Gastroenterology

## 2020-07-22 ENCOUNTER — Encounter
Admission: RE | Disposition: A | Discharge status: Home / Self Care | Payer: Self-pay | Source: Home / Self Care | Attending: Gastroenterology

## 2020-07-22 ENCOUNTER — Encounter: Payer: Self-pay | Admitting: Gastroenterology

## 2020-07-22 ENCOUNTER — Ambulatory Visit: Payer: Medicare Other | Admitting: Anesthesiology

## 2020-07-22 ENCOUNTER — Other Ambulatory Visit: Payer: Self-pay

## 2020-07-22 DIAGNOSIS — K5900 Constipation, unspecified: Secondary | ICD-10-CM | POA: Diagnosis not present

## 2020-07-22 DIAGNOSIS — Z79899 Other long term (current) drug therapy: Secondary | ICD-10-CM | POA: Diagnosis not present

## 2020-07-22 DIAGNOSIS — I10 Essential (primary) hypertension: Secondary | ICD-10-CM | POA: Insufficient documentation

## 2020-07-22 DIAGNOSIS — Z8249 Family history of ischemic heart disease and other diseases of the circulatory system: Secondary | ICD-10-CM | POA: Diagnosis not present

## 2020-07-22 DIAGNOSIS — Z880 Allergy status to penicillin: Secondary | ICD-10-CM | POA: Diagnosis not present

## 2020-07-22 DIAGNOSIS — Z1211 Encounter for screening for malignant neoplasm of colon: Secondary | ICD-10-CM | POA: Diagnosis not present

## 2020-07-22 DIAGNOSIS — R1310 Dysphagia, unspecified: Secondary | ICD-10-CM

## 2020-07-22 DIAGNOSIS — Z90710 Acquired absence of both cervix and uterus: Secondary | ICD-10-CM | POA: Diagnosis not present

## 2020-07-22 DIAGNOSIS — K219 Gastro-esophageal reflux disease without esophagitis: Secondary | ICD-10-CM | POA: Diagnosis not present

## 2020-07-22 HISTORY — PX: COLONOSCOPY WITH PROPOFOL: SHX5780

## 2020-07-22 HISTORY — PX: ESOPHAGOGASTRODUODENOSCOPY (EGD) WITH PROPOFOL: SHX5813

## 2020-07-22 SURGERY — COLONOSCOPY WITH PROPOFOL
Anesthesia: General

## 2020-07-22 MED ORDER — PROPOFOL 10 MG/ML IV BOLUS
INTRAVENOUS | Status: DC | PRN
  Administered 2020-07-22: 50 mg via INTRAVENOUS
  Administered 2020-07-22: 20 mg via INTRAVENOUS

## 2020-07-22 MED ORDER — LIDOCAINE HCL (CARDIAC) PF 100 MG/5ML IV SOSY
PREFILLED_SYRINGE | INTRAVENOUS | Status: DC | PRN
  Administered 2020-07-22: 80 mg via INTRAVENOUS

## 2020-07-22 MED ORDER — SODIUM CHLORIDE 0.9 % IV SOLN: INTRAVENOUS | Status: DC

## 2020-07-22 MED ORDER — FENTANYL CITRATE (PF) 100 MCG/2ML IJ SOLN
INTRAMUSCULAR | Status: DC | PRN
  Administered 2020-07-22 (×2): 50 ug via INTRAVENOUS

## 2020-07-22 MED ORDER — MIDAZOLAM HCL 2 MG/2ML IJ SOLN
INTRAMUSCULAR | Status: AC
  Filled 2020-07-22: qty 2

## 2020-07-22 MED ORDER — FENTANYL CITRATE (PF) 100 MCG/2ML IJ SOLN
INTRAMUSCULAR | Status: AC
  Filled 2020-07-22: qty 2

## 2020-07-22 MED ORDER — MIDAZOLAM HCL 2 MG/2ML IJ SOLN
INTRAMUSCULAR | Status: DC | PRN
  Administered 2020-07-22: 2 mg via INTRAVENOUS

## 2020-07-22 MED ORDER — LIDOCAINE HCL (PF) 2 % IJ SOLN
INTRAMUSCULAR | Status: AC
  Filled 2020-07-22: qty 5

## 2020-07-22 MED ORDER — PROPOFOL 500 MG/50ML IV EMUL
INTRAVENOUS | Status: DC | PRN
  Administered 2020-07-22: 50 ug/kg/min via INTRAVENOUS

## 2020-07-22 MED ORDER — PROPOFOL 500 MG/50ML IV EMUL
INTRAVENOUS | Status: AC
  Filled 2020-07-22: qty 50

## 2020-07-22 NOTE — Op Note (Signed)
[facility] Gastroenterology Patient Name: Kristin Mcdowell Procedure Date: 07/22/2020 9:42 AM MRN: [id number] Account #: [id number] Date of Birth: Jun 06, 1960 Admit Type: Outpatient Age: 60 Room: [facility] ENDO ROOM 4 Gender: Female Note Status: Finalized Procedure:             Upper GI endoscopy Indications:           Dysphagia Providers:             Wyline Mood MD, MD Referring MD:          Leanna Sato, MD (Referring MD) Medicines:             Monitored Anesthesia Care Complications:         No immediate complications. Procedure:             Pre-Anesthesia Assessment:                        - Prior to the procedure, a History and Physical was                         performed, and patient medications, allergies and                         sensitivities were reviewed. The patient's tolerance                         of previous anesthesia was reviewed.                        - The risks and benefits of the procedure and the                         sedation options and risks were discussed with the                         patient. All questions were answered and informed                         consent was obtained.                        - ASA Grade Assessment: II - A patient with mild                         systemic disease.                        After obtaining informed consent, the endoscope was                         passed under direct vision. Throughout the procedure,                         the patient's blood pressure, pulse, and oxygen                         saturations were monitored continuously. The Endoscope                         was introduced through the mouth, and advanced  to the                         third part of duodenum. The upper GI endoscopy was                         accomplished with ease. The patient tolerated the                         procedure well. Findings:      The stomach was normal.      The examined duodenum was normal.       The examined esophagus was normal. Biopsies were taken with a cold       forceps for histology.      The cardia and gastric fundus were normal on retroflexion. Impression:            - Normal stomach.                        - Normal examined duodenum.                        - Normal esophagus. Biopsied. Recommendation:        - Await pathology results.                        - Perform a colonoscopy today. Procedure Code(s):     --- Professional ---                        [phone], Esophagogastroduodenoscopy, flexible,                         transoral; with biopsy, single or multiple Diagnosis Code(s):     --- Professional ---                        R13.10, Dysphagia, unspecified CPT copyright 2019 American Medical Association. All rights reserved. The codes documented in this report are preliminary and upon coder review may  be revised to meet current compliance requirements. Wyline Mood, MD Wyline Mood MD, MD 07/22/2020 9:50:45 AM This report has been signed electronically. Number of Addenda: 0 Note Initiated On: 07/22/2020 9:42 AM Estimated Blood Loss:  Estimated blood loss: none.      [facility]

## 2020-07-22 NOTE — Op Note (Signed)
[facility] Gastroenterology Patient Name: Kristin Mcdowell Procedure Date: 07/22/2020 9:41 AM MRN: [id number] Account #: [id number] Date of Birth: [birth year] Admit Type: Outpatient Age: 60 Room: [facility] ENDO ROOM 4 Gender: Female Note Status: Finalized Procedure:             Colonoscopy Indications:           Constipation Providers:             Wyline Mood MD, MD Referring MD:          Leanna Sato, MD (Referring MD) Medicines:             Monitored Anesthesia Care Complications:         No immediate complications. Procedure:             Pre-Anesthesia Assessment:                        - Prior to the procedure, a History and Physical was                         performed, and patient medications, allergies and                         sensitivities were reviewed. The patient's tolerance                         of previous anesthesia was reviewed.                        - The risks and benefits of the procedure and the                         sedation options and risks were discussed with the                         patient. All questions were answered and informed                         consent was obtained.                        - ASA Grade Assessment: II - A patient with mild                         systemic disease.                        After obtaining informed consent, the colonoscope was                         passed under direct vision. Throughout the procedure,                         the patient's blood pressure, pulse, and oxygen                         saturations were monitored continuously. The                         Colonoscope was introduced through the anus and  advanced to the the cecum, identified by the                         appendiceal orifice. The colonoscopy was performed                         with ease. The patient tolerated the procedure well.                         The quality of the bowel preparation was  excellent. Findings:      The perianal and digital rectal examinations were normal.      The retroflexed view of the distal rectum and anal verge was normal and       showed no anal or rectal abnormalities. Impression:            - The distal rectum and anal verge are normal on                         retroflexion view.                        - No specimens collected. Recommendation:        - Discharge patient to home (with escort).                        - Resume previous diet.                        - Continue present medications.                        - Repeat colonoscopy in 10 years for screening                         purposes. Procedure Code(s):     --- Professional ---                        [phone], Colonoscopy, flexible; diagnostic, including                         collection of specimen(s) by brushing or washing, when                         performed (separate procedure) Diagnosis Code(s):     --- Professional ---                        K59.00, Constipation, unspecified CPT copyright 2019 American Medical Association. All rights reserved. The codes documented in this report are preliminary and upon coder review may  be revised to meet current compliance requirements. Wyline Mood, MD Wyline Mood MD, MD 07/22/2020 10:15:34 AM This report has been signed electronically. Number of Addenda: 0 Note Initiated On: 07/22/2020 9:41 AM Scope Withdrawal Time: 0 hours 15 minutes 4 seconds  Total Procedure Duration: 0 hours 19 minutes 39 seconds  Estimated Blood Loss:  Estimated blood loss: none.      [facility]

## 2020-07-22 NOTE — Anesthesia Postprocedure Evaluation (Signed)
Anesthesia Post Note  Patient: Kristin Mcdowell  Procedure(s) Performed: COLONOSCOPY WITH PROPOFOL (N/A ) ESOPHAGOGASTRODUODENOSCOPY (EGD) WITH PROPOFOL (N/A )  Patient location during evaluation: Endoscopy Anesthesia Type: General Level of consciousness: awake and alert Pain management: pain level controlled Vital Signs Assessment: post-procedure vital signs reviewed and stable Respiratory status: spontaneous breathing, nonlabored ventilation, respiratory function stable and patient connected to nasal cannula oxygen Cardiovascular status: blood pressure returned to baseline and stable Postop Assessment: no apparent nausea or vomiting Anesthetic complications: no   No complications documented.   Last Vitals:  Vitals:   07/22/20 1035 07/22/20 1045  BP: 133/86 (!) 137/98  Pulse: 69 62  Resp: 17 11  Temp:    SpO2: 100% 100%    Last Pain:  Vitals:   07/22/20 1045  TempSrc:   PainSc: 0-No pain                 Lenard Simmer

## 2020-07-22 NOTE — Anesthesia Preprocedure Evaluation (Signed)
Anesthesia Evaluation  Patient identified by MRN, date of birth, ID band Patient awake    Reviewed: Allergy & Precautions, H&P , NPO status   History of Anesthesia Complications Negative for: history of anesthetic complications  Airway Mallampati: II  TM Distance: >3 FB Neck ROM: full    Dental  (+) Dental Advidsory Given, Teeth Intact   Pulmonary neg pulmonary ROS,    breath sounds clear to auscultation       Cardiovascular Exercise Tolerance: Good hypertension, (-) angina(-) Past MI and (-) Cardiac Stents (-) dysrhythmias (-) Valvular Problems/Murmurs Rhythm:regular Rate:Normal     Neuro/Psych PSYCHIATRIC DISORDERS Depression negative neurological ROS     GI/Hepatic Neg liver ROS, GERD  ,  Endo/Other  negative endocrine ROS  Renal/GU negative Renal ROS     Musculoskeletal   Abdominal   Peds negative pediatric ROS (+)  Hematology negative hematology ROS (+)   Anesthesia Other Findings Past Medical History: No date: Depression No date: GERD (gastroesophageal reflux disease) No date: Hypertension No date: Seasonal allergies  Reproductive/Obstetrics                             Anesthesia Physical  Anesthesia Plan  ASA: II  Anesthesia Plan: General   Post-op Pain Management:    Induction: Intravenous  PONV Risk Score and Plan: Propofol infusion  Airway Management Planned: Natural Airway and Nasal Cannula  Additional Equipment:   Intra-op Plan:   Post-operative Plan:   Informed Consent: I have reviewed the patients History and Physical, chart, labs and discussed the procedure including the risks, benefits and alternatives for the proposed anesthesia with the patient or authorized representative who has indicated his/her understanding and acceptance.       Plan Discussed with: CRNA  Anesthesia Plan Comments:         Anesthesia Quick Evaluation

## 2020-07-22 NOTE — Transfer of Care (Signed)
Immediate Anesthesia Transfer of Care Note  Patient: FALICIA LIZOTTE  Procedure(s) Performed: COLONOSCOPY WITH PROPOFOL (N/A ) ESOPHAGOGASTRODUODENOSCOPY (EGD) WITH PROPOFOL (N/A )  Patient Location: PACU  Anesthesia Type:General  Level of Consciousness: sedated  Airway & Oxygen Therapy: Patient Spontanous Breathing and Patient connected to nasal cannula oxygen  Post-op Assessment: Report given to RN and Post -op Vital signs reviewed and stable  Post vital signs: Reviewed and stable  Last Vitals:  Vitals Value Taken Time  BP 120/75 07/22/20 1015  Temp 35.7 C 07/22/20 1015  Pulse 75 07/22/20 1016  Resp 8 07/22/20 1016  SpO2 100 % 07/22/20 1016  Vitals shown include unvalidated device data.  Last Pain:  Vitals:   07/22/20 1015  TempSrc: Temporal  PainSc: Asleep         Complications: No complications documented.

## 2020-07-22 NOTE — H&P (Signed)
Wyline Mood, MD [address], [place], [place], [address] [address], [place], [place], [address] Phone: [phone]  Fax: [phone]  Primary Care Physician:  Leanna Sato, MD   Pre-Procedure History & Physical: HPI:  Kristin Mcdowell is a 60 y.o. female is here for an endoscopy and colonoscopy    Past Medical History:  Diagnosis Date  . Depression   . GERD (gastroesophageal reflux disease)   . Hypertension   . Seasonal allergies     Past Surgical History:  Procedure Laterality Date  . ABDOMINAL HYSTERECTOMY    . COLONOSCOPY WITH PROPOFOL N/A 08/30/2015   Procedure: COLONOSCOPY WITH PROPOFOL;  Surgeon: Midge Minium, MD;  Location: [facility];  Service: Endoscopy;  Laterality: N/A;  . COLONOSCOPY WITH PROPOFOL N/A 06/14/2020   Procedure: COLONOSCOPY WITH PROPOFOL;  Surgeon: Wyline Mood, MD;  Location: [facility] END[facility];  Service: Gastroenterology;  Laterality: N/A;  . LUMBAR DISC SURGERY      Prior to Admission medications   Medication Sig Start Date End Date Taking? Authorizing Provider  acetaminophen (TYLENOL) 325 MG tablet Take 325 mg by mouth as needed.   Yes [provider]  amLODipine-atorvastatin (CADUET) 5-10 MG tablet Take by mouth.   Yes [provider]  COD LIVER OIL PO Take by mouth daily.   Yes [provider]  cyclobenzaprine (FLEXERIL) 10 MG tablet Take 10 mg by mouth 2 (two) times daily as needed for muscle spasms.   Yes [provider]  docusate sodium (COLACE) 100 MG capsule Take 100 mg by mouth 2 (two) times daily as needed for mild constipation.   Yes [provider]  lidocaine (LIDODERM) 5 % Place 1 patch onto the skin daily as needed. Remove & Discard patch within 12 hours or as directed by MD   Yes [provider]  Multiple Vitamin (MULTIVITAMIN) capsule Take 1 capsule by mouth daily.   Yes [provider]  omeprazole (PRILOSEC) 20 MG capsule Take 20 mg by  mouth daily. Patient not taking: No sig reported    [provider]  polyethylene glycol-electrolytes (NULYTELY) 420 g solution Patient will take 2 Golytely bowel preps. Patient not taking: Reported on 07/22/2020 07/10/20   Wyline Mood, MD    Allergies as of 07/10/2020 - Review Complete 07/10/2020  Allergen Reaction Noted  . Penicillins  09/23/2012    Family History  Problem Relation Age of Onset  . Heart disease Mother   . Stroke Brother   . Breast cancer Neg Hx     Social History   Socioeconomic History  . Marital status: Single    Spouse name: Not on file  . Number of children: Not on file  . Years of education: Not on file  . Highest education level: Not on file  Occupational History  . Not on file  Tobacco Use  . Smoking status: Never Smoker  . Smokeless tobacco: Never Used  Vaping Use  . Vaping Use: Never used  Substance and Sexual Activity  . Alcohol use: No  . Drug use: Never  . Sexual activity: Not on file  Other Topics Concern  . Not on file  Social History Narrative  . Not on file   Social Determinants of Health   Financial Resource Strain: Not on file  Food Insecurity: Not on file  Transportation Needs: Not on file  Physical Activity: Not on file  Stress: Not on file  Social Connections: Not on file  Intimate Partner Violence: Not on file    Review of Systems: See HPI, otherwise negative ROS  Physical Exam: BP (!) 147/104   Pulse 82   Temp (!) 96.9 F (36.1 C) (Temporal)   Resp 18   Ht 5\' 6"  (1.676 m)   Wt 73.9 kg   SpO2 100%   BMI 26.31 kg/m  General:   Alert,  pleasant and cooperative in NAD Head:  Normocephalic and atraumatic. Neck:  Supple; no masses or thyromegaly. Lungs:  Clear throughout to auscultation, normal respiratory effort.    Heart:  +S1, +S2, Regular rate and rhythm, No edema. Abdomen:  Soft, nontender and nondistended. Normal bowel sounds, without guarding, and without rebound.   Neurologic:  Alert and   oriented x4;  grossly normal neurologically.  Impression/Plan: Kristin Mcdowell is here for an endoscopy and colonoscopy  to be performed for  evaluation of dysphagia and constipation     Risks, benefits, limitations, and alternatives regarding endoscopy have been reviewed with the patient.  Questions have been answered.  All parties agreeable.   Ansel Bong, MD  07/22/2020, 9:31 AM

## 2020-07-23 ENCOUNTER — Encounter: Payer: Self-pay | Admitting: Gastroenterology

## 2020-07-23 LAB — SURGICAL PATHOLOGY

## 2020-07-25 ENCOUNTER — Ambulatory Visit: Payer: Medicare Other | Attending: Family Medicine

## 2020-08-06 ENCOUNTER — Other Ambulatory Visit: Payer: Self-pay

## 2020-08-06 ENCOUNTER — Ambulatory Visit: Payer: Medicare Other | Attending: Family Medicine

## 2020-08-06 DIAGNOSIS — G8929 Other chronic pain: Secondary | ICD-10-CM | POA: Insufficient documentation

## 2020-08-06 DIAGNOSIS — M545 Low back pain, unspecified: Secondary | ICD-10-CM | POA: Diagnosis present

## 2020-08-06 DIAGNOSIS — M5416 Radiculopathy, lumbar region: Secondary | ICD-10-CM | POA: Diagnosis present

## 2020-08-06 NOTE — Patient Instructions (Signed)
Access Code: [facility] URL: [email or web address] Date: 08/06/2020 Prepared by: Loralyn Freshwater  Exercises Seated Piriformis Stretch - 3 x daily - 7 x weekly - 1 sets - 3 reps - 30 seconds hold

## 2020-08-06 NOTE — Therapy (Signed)
[facility] [address] [place], [address] Phone: [phone]   Fax:  [phone]  Physical Therapy Evaluation  Patient Details  Name: Kristin Mcdowell MRN: [id number] Date of Birth: 28-Jun-1960 Referring Provider (PT): Burman Freestone, NP   Encounter Date: 08/06/2020   PT End of Session - 08/06/20 1018    Visit Number 1    Number of Visits 17    Date for PT Re-Evaluation 10/03/20    PT Start Time 1019    PT Stop Time 1103    PT Time Calculation (min) 44 min    Activity Tolerance Patient limited by pain    Behavior During Therapy [facility] for tasks assessed/performed           Past Medical History:  Diagnosis Date  . Depression   . GERD (gastroesophageal reflux disease)   . Hypertension   . Seasonal allergies     Past Surgical History:  Procedure Laterality Date  . ABDOMINAL HYSTERECTOMY    . COLONOSCOPY WITH PROPOFOL N/A 08/30/2015   Procedure: COLONOSCOPY WITH PROPOFOL;  Surgeon: Midge Minium, MD;  Location: [facility];  Service: Endoscopy;  Laterality: N/A;  . COLONOSCOPY WITH PROPOFOL N/A 06/14/2020   Procedure: COLONOSCOPY WITH PROPOFOL;  Surgeon: Wyline Mood, MD;  Location: [facility];  Service: Gastroenterology;  Laterality: N/A;  . COLONOSCOPY WITH PROPOFOL N/A 07/22/2020   Procedure: COLONOSCOPY WITH PROPOFOL;  Surgeon: Wyline Mood, MD;  [facility];  Service: Gastroenterology;  Laterality: N/A;  . ESOPHAGOGASTRODUODENOSCOPY (EGD) WITH PROPOFOL N/A 07/22/2020   Procedure: ESOPHAGOGASTRODUODENOSCOPY (EGD) WITH PROPOFOL;  Surgeon: Wyline Mood, MD;  Location: [facility] END[facility];  Service: Gastroenterology;  Laterality: N/A;  . LUMBAR DISC SURGERY      There were no vitals filed for this visit.    Subjective Assessment - 08/06/20 1021    Subjective Low back pain: 9/10 currently (pt sitting, does not appear in distress), 10/10 at most for the past 3 months    Pertinent  History Low back pain. Has had surgery in 2018. Still having problems with L LE symptoms to ankle (aroudn L4/L5 dermatome). Has been getting ESI. Most recent injection did not really help. Pain is mainly on L side. Pain started on R side but pain switched to L side since getting her ESI. Had L4/L5 fusion 2018 and pt is still hurting. Denies loss of bowel or bladder control or saddle anesthesia. Sill has pain it her belly button area from the anterior approach for her lumbar fusion. Currently going to the pain clinic.    Patient Stated Goals Decrease pain.    Currently in Pain? 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 Sitting. Supine, L S/L    Pain Relieving Factors Muscle relaxers. Supine? R S/L? Sitting with pillow behind back.              [facility] PT Assessment - 08/06/20 1033      Assessment   Medical Diagnosis Lumbar DDD and radiculitis    Referring Provider (PT) Burman Freestone, NP  Onset Date/Surgical Date 07/02/20   Date PT referral signed. Chronic condition     Precautions   Precaution Comments No known precautions      Restrictions   Other Position/Activity Restrictions No known restrictions      Observation/Other Assessments   Focus on Therapeutic Outcomes (FOTO)  Lumbar FOTO 40      Posture/Postural Control   Posture Comments R shoulder lower, L lateral shift starting at L1/L2 area. Movement preference around L5/S1      AROM   Lumbar Flexion Limited with pain, hinging at around L1/L2. Aberrant movement in return motion    Lumbar Extension limited with back pain and L LE symptoms    Lumbar - Right Side Bend Limited with L lumbar pull     Lumbar - Left Side Bend WFL, no pain    Lumbar - Right Rotation WFL with L lumbar pull    Lumbar - Left Rotation WFL with not as much pull as R rotation.      PROM   Right Hip Internal Rotation  -18   with low back pulling   Left Hip Internal Rotation  -8   with low back pulling     Strength   Right Hip Flexion 4+/5    Right Hip Extension 4/5   Seated manually resisted   Right Hip ABduction 4+/5   Seated manually resisted clamshell   Left Hip Flexion 3+/5    Left Hip Extension 4/5   Seated manually resisted   Left Hip ABduction 4+/5   Seated manually resisted clamshell   Right Knee Flexion 5/5    Right Knee Extension 5/5    Left Knee Flexion 4+/5    Left Knee Extension 5/5      Special Tests   Other special tests Long sit test suggests posterior nutation of L innominate.      Ambulation/Gait   Gait Comments antalgic, decreased stance L LE                      Objective measurements completed on examination: See above findings.   No latex band allergies Takes blood pressure medicine  Back pain feels like pulling  Medbridge Access Code [email or web address]    Therapeutic exercise Log rolling supine to R S/L to sit. More comfortable for back reported.   setaed L hip piriformis stretch 30 seconds x 3  Seated hip adduction isometrics 10x5 seconds    Improved exercise technique, movement at target joints, use of target muscles after mod verbal, visual, tactile cues.   Response to treatment Fair tolerance to today's session.   Clinical impression Patient is a 60 year old female who came to physical therapy secondary to chronic low back pain. She also presents with LE radiating symptoms, altered gait pattern and posture, hip weakness, limited hip ROM, positive special test suggesting lumbopelvic involvement, and difficulty performing tasks which involve standing as well as bending secondary to pain. Pt will benefit from skilled physical therapy services to address the  aforementioned deficits.      .               PT Education - 08/06/20 1252    Education Details ther-ex, HEP, plan of care    Person(s) Educated Patient    Methods Explanation;Demonstration;Tactile cues;Verbal cues;Handout    Comprehension Returned demonstration;Verbalized understanding            PT Short Term Goals - 08/06/20 1256      PT SHORT  TERM GOAL #1   Title Pt will be independent with her initial HEP to decrease pain, improve strength and function.    Baseline Pt has started her initial HEP (08/05/2020)    Time 3    Period Weeks    Status New    Target Date 08/30/20             PT Long Term Goals - 08/06/20 1257      PT LONG TERM GOAL #1   Title Pt will have a decrease in back pain to 6/10 or less at most to promote ability to tolerate standing and sitting, and picking up her laundry basket more comfortably.    Baseline 10/10 back pain at most for the past 3 months (08/06/2020)    Time 8    Period Weeks    Status New    Target Date 10/03/20      PT LONG TERM GOAL #2   Title Pt will improve bilateral hip extension and abduction strength by at least 1/2 MMT grade to promote ability to perform standing tasks more comfortably.    Baseline hip extension 4/5 B, hip abduction4+/5 B (08/06/2020)    Time 8    Period Weeks    Status New    Target Date 10/03/20      PT LONG TERM GOAL #3   Title Pt will improve her FOTO score by at least 10 points as a demonstration of improved function.    Baseline Lumbar spine FOTO 40 (08/06/2020)    Time 8    Period Weeks    Status New    Target Date 10/03/20                  Plan - 08/06/20 1134    Clinical Impression Statement Patient is a 60 year old female who came to physical therapy secondary to chronic low back pain. She also presents with LE radiating symptoms, altered gait pattern and posture, hip weakness, limited hip ROM, positive special test suggesting lumbopelvic involvement, and difficulty  performing tasks which involve standing as well as bending secondary to pain. Pt will benefit from skilled physical therapy services to address the aforementioned deficits.    Personal Factors and Comorbidities Comorbidity 3+;Fitness;Past/Current Experience;Time since onset of injury/illness/exacerbation    Comorbidities Depression, HTN, hx of back surgery, hx of abdominal hysterectomy    Examination-Activity Limitations Bathing;Hygiene/Grooming;Bed Mobility;Lift;Bend;Stand;Caring for Others;Reach Overhead;Carry;Dressing    Stability/Clinical Decision Making Stable/Uncomplicated    Clinical Decision Making Low    Rehab Potential Fair    PT Frequency 2x / week    PT Duration 8 weeks    PT Treatment/Interventions Therapeutic activities;Therapeutic exercise;Neuromuscular re-education;Patient/family education;Manual techniques;Dry needling;Electrical Stimulation;Iontophoresis 4mg /ml Dexamethasone    PT Next Visit Plan posture, scapular, trunk, hip strengthening, manual techniques, modalities PRN    PT Home Exercise Plan Medbridge Access Code [facility]    Consulted and Agree with Plan of Care Patient           Patient will benefit from skilled therapeutic intervention in order to improve the following deficits and impairments:  Pain,Improper body mechanics,Postural dysfunction,Decreased strength,Difficulty walking,Decreased range of motion,Decreased activity tolerance  Visit Diagnosis: Chronic bilateral low back pain, unspecified whether sciatica present - Plan: PT plan of care cert/re-cert  Radiculopathy, lumbar region - Plan: PT plan of care cert/re-cert     Problem List Patient Active Problem List   Diagnosis Date Noted  . Dysphagia 07/10/2020  . Colon cancer screening   . Gastro-esophageal reflux  disease without esophagitis 01/25/2015  . Other fatigue 01/25/2015  . Allergic rhinitis 01/31/2014  . Essential (primary) hypertension 01/31/2014   Loralyn Freshwater PT, DPT  08/06/2020, 1:11  PM  [facility] PH[facility] SPORTS MEDICINE [address], [place], [address] Phone: [phone]   Fax:  [phone]  Name: MEEGAN SHANAFELT MRN: [id number] Date of Birth: [birth year]

## 2020-08-08 ENCOUNTER — Ambulatory Visit: Payer: Medicare Other

## 2020-08-13 ENCOUNTER — Other Ambulatory Visit: Payer: Self-pay

## 2020-08-13 ENCOUNTER — Ambulatory Visit: Payer: Medicare Other

## 2020-08-13 DIAGNOSIS — M5416 Radiculopathy, lumbar region: Secondary | ICD-10-CM

## 2020-08-13 DIAGNOSIS — G8929 Other chronic pain: Secondary | ICD-10-CM

## 2020-08-13 DIAGNOSIS — M545 Low back pain, unspecified: Secondary | ICD-10-CM | POA: Diagnosis not present

## 2020-08-13 NOTE — Therapy (Signed)
[facility] [address], [place], [address] Phone: [phone]   Fax:  [phone]  Physical Therapy Treatment  Patient Details  Name: Kristin Mcdowell MRN: [id number] Date of Birth: 1960/09/26 Referring Provider (PT): Burman Freestone, NP   Encounter Date: 08/13/2020   PT End of Session - 08/13/20 1018    Visit Number 2    Number of Visits 17    Date for PT Re-Evaluation 10/03/20    PT Start Time 1018    PT Stop Time 1059    PT Time Calculation (min) 41 min    Activity Tolerance Patient limited by pain    Behavior During Therapy [facility] for tasks assessed/performed           Past Medical History:  Diagnosis Date  . Depression   . GERD (gastroesophageal reflux disease)   . Hypertension   . Seasonal allergies     Past Surgical History:  Procedure Laterality Date  . ABDOMINAL HYSTERECTOMY    . COLONOSCOPY WITH PROPOFOL N/A 08/30/2015   Procedure: COLONOSCOPY WITH PROPOFOL;  Surgeon: Midge Minium, MD;  Location: [facility];  Service: Endoscopy;  Laterality: N/A;  . COLONOSCOPY WITH PROPOFOL N/A 06/14/2020   Procedure: COLONOSCOPY WITH PROPOFOL;  Surgeon: Wyline Mood, MD;  Location: [facility] END[facility]COPY;  Service: Gastroenterology;  Laterality: N/A;  . COLONOSCOPY WITH PROPOFOL N/A 07/22/2020   Procedure: COLONOSCOPY WITH PROPOFOL;  Surgeon: Wyline Mood, MD;  Location: [facility];  Service: Gastroenterology;  Laterality: N/A;  . ESOPHAGOGASTRODUODENOSCOPY (EGD) WITH PROPOFOL N/A 07/22/2020   Procedure: ESOPHAGOGASTRODUODENOSCOPY (EGD) WITH PROPOFOL;  Surgeon: Wyline Mood, MD;  Location: [facility] END[facility]COPY;  Service: Gastroenterology;  Laterality: N/A;  . LUMBAR DISC SURGERY      There were no vitals filed for this visit.   Subjective Assessment - 08/13/20 1019    Subjective My back just hurts all the time. It does not really ease up. 10/10 currently (pt does not appear in distress) becuase it hurts  steady all the time.    Pertinent History Low back pain. Has had surgery in 2018. Still having problems with L LE symptoms to ankle (aroudn L4/L5 dermatome). Has been getting ESI. Most recent injection did not really help. Pain is mainly on L side. Pain started on R side but pain switched to L side since getting her ESI. Had L4/L5 fusion 2018 and pt is still hurting. Denies loss of bowel or bladder control or saddle anesthesia. Sill has pain it her belly button area from the anterior approach for her lumbar fusion. Currently going to the pain clinic.    Patient Stated Goals Decrease pain.    Currently in Pain? Yes    Pain Score 10-Worst pain ever    Pain Onset More than a month ago                                     PT Education - 08/13/20 1021    Education Details ther-ex, HEP    Person(s) Educated Patient    Methods Explanation;Demonstration;Tactile cues;Verbal cues;Handout    Comprehension Returned demonstration;Verbalized understanding          Objective   No latex band allergies Takes blood pressure medicine  Back pain feels like pulling  Medbridge Access Code [facility]   Manual therapy  seated STM B low back paraspinal muscles to decrease tension    Therapeutic exercise  Seated in slight trunk flexion   Transversus abdominis contraction 10x5 seconds   Sitting in upright posture in neutral  Manually resisted trunk extension isometrics 10x5 seconds for 4 sets  Back feels better per pt  Seated hip extension isometrics   L 10x5 seconds for 2 sets  Slight decrease in low back pain.    Improved exercise technique, movement at target joints, use of target muscles after mod verbal, visual, tactile cues.   Response to treatment Decreased back pain reported after session.   Clinical impression Decreased back pain with activation of trunk extensor muscles in neutral to promote better lumbar spine position. Slight decrease low back pain  with L glute max muscle activation. Pt will benefit from continued skilled physical therapy services to decrease pain, improve strength and function.     PT Short Term Goals - 08/06/20 1256      PT SHORT TERM GOAL #1   Title Pt will be independent with her initial HEP to decrease pain, improve strength and function.    Baseline Pt has started her initial HEP (08/05/2020)    Time 3    Period Weeks    Status New    Target Date 08/30/20             PT Long Term Goals - 08/06/20 1257      PT LONG TERM GOAL #1   Title Pt will have a decrease in back pain to 6/10 or less at most to promote ability to tolerate standing and sitting, and picking up her laundry basket more comfortably.    Baseline 10/10 back pain at most for the past 3 months (08/06/2020)    Time 8    Period Weeks    Status New    Target Date 10/03/20      PT LONG TERM GOAL #2   Title Pt will improve bilateral hip extension and abduction strength by at least 1/2 MMT grade to promote ability to perform standing tasks more comfortably.    Baseline hip extension 4/5 B, hip abduction4+/5 B (08/06/2020)    Time 8    Period Weeks    Status New    Target Date 10/03/20      PT LONG TERM GOAL #3   Title Pt will improve her FOTO score by at least 10 points as a demonstration of improved function.    Baseline Lumbar spine FOTO 40 (08/06/2020)    Time 8    Period Weeks    Status New    Target Date 10/03/20                 Plan - 08/13/20 1022    Clinical Impression Statement Decreased back pain with activation of trunk extensor muscles in neutral to promote better lumbar spine position. Slight decrease low back pain with L glute max muscle activation. Pt will benefit from continued skilled physical therapy services to decrease pain, improve strength and function.    Personal Factors and Comorbidities Comorbidity 3+;Fitness;Past/Current Experience;Time since onset of injury/illness/exacerbation    Comorbidities Depression,  HTN, hx of back surgery, hx of abdominal hysterectomy    Examination-Activity Limitations Bathing;Hygiene/Grooming;Bed Mobility;Lift;Bend;Stand;Caring for Others;Reach Overhead;Carry;Dressing    Stability/Clinical Decision Making Stable/Uncomplicated    Clinical Decision Making Low    Rehab Potential Fair    PT Frequency 2x / week    PT Duration 8 weeks    PT Treatment/Interventions Therapeutic activities;Therapeutic exercise;Neuromuscular re-education;Patient/family education;Manual techniques;Dry needling;Electrical Stimulation;Iontophoresis 4mg /ml Dexamethasone    PT Next  Visit Plan posture, scapular, trunk, hip strengthening, manual techniques, modalities PRN    PT Home Exercise Plan Medbridge Access Code [facility]    Consulted and Agree with Plan of Care Patient           Patient will benefit from skilled therapeutic intervention in order to improve the following deficits and impairments:  Pain,Improper body mechanics,Postural dysfunction,Decreased strength,Difficulty walking,Decreased range of motion,Decreased activity tolerance  Visit Diagnosis: Chronic bilateral low back pain, unspecified whether sciatica present  Radiculopathy, lumbar region     Problem List Patient Active Problem List   Diagnosis Date Noted  . Dysphagia 07/10/2020  . Colon cancer screening   . Gastro-esophageal reflux disease without esophagitis 01/25/2015  . Other fatigue 01/25/2015  . Allergic rhinitis 01/31/2014  . Essential (primary) hypertension 01/31/2014    Loralyn Freshwater PT, DPT   08/13/2020, 1:08 PM  [facility] PHYSICAL AND SPORTS MEDICINE [address] [place], [address] Phone: [phone]   Fax:  [phone]  Name: Kristin Mcdowell MRN: [id number] Date of Birth: 15-Jan-1961

## 2020-08-13 NOTE — Patient Instructions (Signed)
Seated hip extension isometrics   Sitting on a chair,    Squeeze your rear end muscles together and press your left foot onto the floor.     Hold for 5 seconds    Repeat 10 times   Perform 3 sets daily.      This is a corrective exercise. Once you no longer have symptoms, you can stop.   

## 2020-08-14 ENCOUNTER — Ambulatory Visit: Payer: Medicare Other

## 2020-08-14 DIAGNOSIS — M545 Low back pain, unspecified: Secondary | ICD-10-CM

## 2020-08-14 DIAGNOSIS — M5416 Radiculopathy, lumbar region: Secondary | ICD-10-CM

## 2020-08-14 DIAGNOSIS — G8929 Other chronic pain: Secondary | ICD-10-CM

## 2020-08-14 NOTE — Therapy (Signed)
[place] [facility] [address] [place], [address] Phone: [phone]   Fax:  [phone]  Physical Therapy Treatment  Patient Details  Name: Kristin Mcdowell MRN: [id number] Date of Birth: Apr 17, 1960 Referring Provider (PT): Burman Freestone, NP   Encounter Date: 08/14/2020   PT End of Session - 08/14/20 1139    Visit Number 3    Number of Visits 17    Date for PT Re-Evaluation 10/03/20    PT Start Time 1139    PT Stop Time 1220    PT Time Calculation (min) 41 min    Activity Tolerance Patient limited by pain    Behavior During Therapy [facility] for tasks assessed/performed           Past Medical History:  Diagnosis Date  . Depression   . GERD (gastroesophageal reflux disease)   . Hypertension   . Seasonal allergies     Past Surgical History:  Procedure Laterality Date  . ABDOMINAL HYSTERECTOMY    . COLONOSCOPY WITH PROPOFOL N/A 08/30/2015   Procedure: COLONOSCOPY WITH PROPOFOL;  Surgeon: Midge Minium, MD;  Location: [facility];  Service: Endoscopy;  Laterality: N/A;  . COLONOSCOPY WITH PROPOFOL N/A 06/14/2020   Procedure: COLONOSCOPY WITH PROPOFOL;  Surgeon: Wyline Mood, MD;  Location: [facility] END[facility];  Service: Gastroenterology;  Laterality: N/A;  . COLONOSCOPY WITH PROPOFOL N/A 07/22/2020   Procedure: COLONOSCOPY WITH PROPOFOL;  Surgeon: Wyline Mood, MD;  Location: [facility] ENDOSCOPY;  Service: Gastroenterology;  Laterality: N/A;  . ESOPHAGOGASTRODUODENOSCOPY (EGD) WITH PROPOFOL N/A 07/22/2020   Procedure: ESOPHAGOGASTRODUODENOSCOPY (EGD) WITH PROPOFOL;  Surgeon: Wyline Mood, MD;  Location: [facility] ENDOSCOPY;  Service: Gastroenterology;  Laterality: N/A;  . LUMBAR DISC SURGERY      There were no vitals filed for this visit.   Subjective Assessment - 08/14/20 1140    Subjective Aching a little more than usual. Still a 10/10 back pain currently. Did not hurt as much last night after the back exrecises last  session.    Pertinent History Low back pain. Has had surgery in 2018. Still having problems with L LE symptoms to ankle (aroudn L4/L5 dermatome). Has been getting ESI. Most recent injection did not really help. Pain is mainly on L side. Pain started on R side but pain switched to L side since getting her ESI. Had L4/L5 fusion 2018 and pt is still hurting. Denies loss of bowel or bladder control or saddle anesthesia. Sill has pain it her belly button area from the anterior approach for her lumbar fusion. Currently going to the pain clinic.    Patient Stated Goals Decrease pain.    Currently in Pain? Yes    Pain Score 10-Worst pain ever    Pain Onset More than a month ago                                     PT Education - 08/14/20 1145    Education Details ther-ex    [employer]) Educated Patient    Methods Explanation;Demonstration;Tactile cues;Verbal cues    Comprehension Returned demonstration;Verbalized understanding          Objective   No latex band allergies Takes blood pressure medicine  Back pain feels like pulling  MedbridgeAccess Code [facility]    Therapeutic exercise  Hooklying posterior pelvic tilt 10x  Then 10x5 seconds for 3 sets  Crunches 10x   hooklying glute  max squeeze 10x5 seconds for 2 sets  Supine SKTC  L 10 seconds   R 10 seconds  Log rolling supine to R S/L to sit.    Sitting in upright posture in neutral             Manually resisted trunk extension isometrics 10x5 seconds for 4 sets             Back feels better per pt  Sitting with lumbar towel roll around L 4 area. Improved comfort level   Then with transversus abdominis contraction 10x5 seconds for 2 sets    Gait from treatment room to clinic door with transversus abdominis and glute max contraction.    Improved exercise technique, movement at target joints, use of target muscles after mod verbal, visual, tactile cues.  Response to treatment Decreased  back pain to 8/10 in sitting, standing and walking after session.    Clinical impression Continued working on improving trunk extensor muscle activation in neutral/slight flexion to promote better upper lumbar/lower thoracic spine position. Continued working on core and glute muscle strength as well to help decrease stress to back. Decreased back pain to 8/10 in sitting and with gait after session. Pt will benefit from continued skilled physical therapy services to decrease pain, improve strength and function.         PT Short Term Goals - 08/06/20 1256      PT SHORT TERM GOAL #1   Title Pt will be independent with her initial HEP to decrease pain, improve strength and function.    Baseline Pt has started her initial HEP (08/05/2020)    Time 3    Period Weeks    Status New    Target Date 08/30/20             PT Long Term Goals - 08/06/20 1257      PT LONG TERM GOAL #1   Title Pt will have a decrease in back pain to 6/10 or less at most to promote ability to tolerate standing and sitting, and picking up her laundry basket more comfortably.    Baseline 10/10 back pain at most for the past 3 months (08/06/2020)    Time 8    Period Weeks    Status New    Target Date 10/03/20      PT LONG TERM GOAL #2   Title Pt will improve bilateral hip extension and abduction strength by at least 1/2 MMT grade to promote ability to perform standing tasks more comfortably.    Baseline hip extension 4/5 B, hip abduction4+/5 B (08/06/2020)    Time 8    Period Weeks    Status New    Target Date 10/03/20      PT LONG TERM GOAL #3   Title Pt will improve her FOTO score by at least 10 points as a demonstration of improved function.    Baseline Lumbar spine FOTO 40 (08/06/2020)    Time 8    Period Weeks    Status New    Target Date 10/03/20                 Plan - 08/14/20 1204    Clinical Impression Statement Continued working on improving trunk extensor muscle activation in neutral/slight  flexion to promote better upper lumbar/lower thoracic spine position. Continued working on core and glute muscle strength as well to help decrease stress to back. Decreased back pain to 8/10 in sitting and with gait after session.  Pt will benefit from continued skilled physical therapy services to decrease pain, improve strength and function.    Personal Factors and Comorbidities Comorbidity 3+;Fitness;Past/Current Experience;Time since onset of injury/illness/exacerbation    Comorbidities Depression, HTN, hx of back surgery, hx of abdominal hysterectomy    Examination-Activity Limitations Bathing;Hygiene/Grooming;Bed Mobility;Lift;Bend;Stand;Caring for Others;Reach Overhead;Carry;Dressing    Stability/Clinical Decision Making Stable/Uncomplicated    Clinical Decision Making Low    Rehab Potential Fair    PT Frequency 2x / week    PT Duration 8 weeks    PT Treatment/Interventions Therapeutic activities;Therapeutic exercise;Neuromuscular re-education;Patient/family education;Manual techniques;Dry needling;Electrical Stimulation;Iontophoresis 4mg /ml Dexamethasone    PT Next Visit Plan posture, scapular, trunk, hip strengthening, manual techniques, modalities PRN    PT Home Exercise Plan Medbridge Access Code [facility]    Consulted and Agree with Plan of Care Patient           Patient will benefit from skilled therapeutic intervention in order to improve the following deficits and impairments:  Pain,Improper body mechanics,Postural dysfunction,Decreased strength,Difficulty walking,Decreased range of motion,Decreased activity tolerance  Visit Diagnosis: Chronic bilateral low back pain, unspecified whether sciatica present  Radiculopathy, lumbar region     Problem List Patient Active Problem List   Diagnosis Date Noted  . Dysphagia 07/10/2020  . Colon cancer screening   . Gastro-esophageal reflux disease without esophagitis 01/25/2015  . Other fatigue 01/25/2015  . Allergic rhinitis  01/31/2014  . Essential (primary) hypertension 01/31/2014    02/02/2014 PT, DPT  08/14/2020, 12:39 PM  [place] [facility] PHYS[facility] SPORTS MEDICINE [address], [place] Phone: [phone]   Fax:  [phone]  Name: Kristin Mcdowell MRN: Ansel Bong Date of Birth: [birth year]

## 2020-08-15 ENCOUNTER — Ambulatory Visit: Payer: Medicare Other

## 2020-08-20 ENCOUNTER — Other Ambulatory Visit: Payer: Self-pay

## 2020-08-20 ENCOUNTER — Ambulatory Visit: Payer: Medicare Other

## 2020-08-20 DIAGNOSIS — M5416 Radiculopathy, lumbar region: Secondary | ICD-10-CM

## 2020-08-20 DIAGNOSIS — M545 Low back pain, unspecified: Secondary | ICD-10-CM

## 2020-08-20 DIAGNOSIS — G8929 Other chronic pain: Secondary | ICD-10-CM

## 2020-08-20 NOTE — Therapy (Signed)
[place] [facility] [address], [place], [address] Phone: [phone]   Fax:  [phone]  Physical Therapy Treatment  Patient Details  Name: Kristin Mcdowell MRN: [id number] Date of Birth: 1960-06-29 Referring Provider (PT): Burman Freestone, NP   Encounter Date: 08/20/2020   PT End of Session - 08/20/20 1104    Visit Number 4    Number of Visits 17    Date for PT Re-Evaluation 10/03/20    PT Start Time 1104    PT Stop Time 1147    PT Time Calculation (min) 43 min    Activity Tolerance Patient limited by pain    Behavior During Therapy [facility] for tasks assessed/performed           Past Medical History:  Diagnosis Date  . Depression   . GERD (gastroesophageal reflux disease)   . Hypertension   . Seasonal allergies     Past Surgical History:  Procedure Laterality Date  . ABDOMINAL HYSTERECTOMY    . COLONOSCOPY WITH PROPOFOL N/A 08/30/2015   Procedure: COLONOSCOPY WITH PROPOFOL;  Surgeon: Midge Minium, MD;  Location: [facility];  Service: Endoscopy;  Laterality: N/A;  . COLONOSCOPY WITH PROPOFOL N/A 06/14/2020   Procedure: COLONOSCOPY WITH PROPOFOL;  Surgeon: Wyline Mood, MD;  Location: [facility];  Service: Gastroenterology;  Laterality: N/A;  . COLONOSCOPY WITH PROPOFOL N/A 07/22/2020   Procedure: COLONOSCOPY WITH PROPOFOL;  Surgeon: Wyline Mood, MD;  Location: [facility] ENDOSCOPY;  Service: Gastroenterology;  Laterality: N/A;  . ESOPHAGOGASTRODUODENOSCOPY (EGD) WITH PROPOFOL N/A 07/22/2020   Procedure: ESOPHAGOGASTRODUODENOSCOPY (EGD) WITH PROPOFOL;  Surgeon: Wyline Mood, MD;  Location: [facility] END[facility]COPY;  Service: Gastroenterology;  Laterality: N/A;  . LUMBAR DISC SURGERY      There were no vitals filed for this visit.   Subjective Assessment - 08/20/20 1105    Subjective Forgot to take 2 Tylenol this morning. 9/10 low back pain currently. Still achy.    Pertinent History Low back pain. Has had  surgery in 2018. Still having problems with L LE symptoms to ankle (aroudn L4/L5 dermatome). Has been getting ESI. Most recent injection did not really help. Pain is mainly on L side. Pain started on R side but pain switched to L side since getting her ESI. Had L4/L5 fusion 2018 and pt is still hurting. Denies loss of bowel or bladder control or saddle anesthesia. Sill has pain it her belly button area from the anterior approach for her lumbar fusion. Currently going to the pain clinic.    Patient Stated Goals Decrease pain.    Currently in Pain? Yes    Pain Score 9     Pain Onset More than a month ago                                     PT Education - 08/20/20 1109    Education Details ther-ex    [employer]) Educated Patient    Methods Explanation;Demonstration;Tactile cues;Verbal cues    Comprehension Returned demonstration;Verbalized understanding          Objective   No latex band allergies Takes blood pressure medicine  Back pain feels like pulling  MedbridgeAccess Code [facility]    Therapeutic exercise  Crunches 10x5 seconds for 3 sets  Hooklying posterior pelvic tilt 10x5 seconds for 3 sets  Supine DKTC 10x5 seconds for 3 sets  Log rolling supine to R S/L  to sit.    Seated B shoulder extension isometrics hands on thighs 10x3 with 5 second holds  Sitting in upright posture in slight lumbar flexion Manually resisted trunk extension isometrics 10x5 seconds for 4 sets  Sitting with lumbar towel roll around L 4 area.            Then with transversus abdominis contraction 10x5 seconds for 3 sets     Improved exercise technique, movement at target joints, use of target muscles after mod verbal, visual, tactile cues.  Response to treatment Pt tolerated session well without aggravation of symptoms.   Clinical impression Decreased starting back pain to 9/10 and without taking tylenol prior to PT compared to  previous sessions. Continued working on posture, as well as trunk strength to help decrease stress to low back. Pt tolerated session well without aggravation of symptoms. Pt will benefit from continued skilled physical therapy services to decrease pain, improve strength and function.         PT Short Term Goals - 08/06/20 1256      PT SHORT TERM GOAL #1   Title Pt will be independent with her initial HEP to decrease pain, improve strength and function.    Baseline Pt has started her initial HEP (08/05/2020)    Time 3    Period Weeks    Status New    Target Date 08/30/20             PT Long Term Goals - 08/06/20 1257      PT LONG TERM GOAL #1   Title Pt will have a decrease in back pain to 6/10 or less at most to promote ability to tolerate standing and sitting, and picking up her laundry basket more comfortably.    Baseline 10/10 back pain at most for the past 3 months (08/06/2020)    Time 8    Period Weeks    Status New    Target Date 10/03/20      PT LONG TERM GOAL #2   Title Pt will improve bilateral hip extension and abduction strength by at least 1/2 MMT grade to promote ability to perform standing tasks more comfortably.    Baseline hip extension 4/5 B, hip abduction4+/5 B (08/06/2020)    Time 8    Period Weeks    Status New    Target Date 10/03/20      PT LONG TERM GOAL #3   Title Pt will improve her FOTO score by at least 10 points as a demonstration of improved function.    Baseline Lumbar spine FOTO 40 (08/06/2020)    Time 8    Period Weeks    Status New    Target Date 10/03/20                 Plan - 08/20/20 1110    Clinical Impression Statement Decreased starting back pain to 9/10 and without taking tylenol prior to PT compared to previous sessions. Continued working on posture, as well as trunk strength to help decrease stress to low back. Pt tolerated session well without aggravation of symptoms. Pt will benefit from continued skilled physical therapy  services to decrease pain, improve strength and function.    Personal Factors and Comorbidities Comorbidity 3+;Fitness;Past/Current Experience;Time since onset of injury/illness/exacerbation    Comorbidities Depression, HTN, hx of back surgery, hx of abdominal hysterectomy    Examination-Activity Limitations Bathing;Hygiene/Grooming;Bed Mobility;Lift;Bend;Stand;Caring for Others;Reach Overhead;Carry;Dressing    Stability/Clinical Decision Making Stable/Uncomplicated    Clinical Decision  Making Low    Rehab Potential Fair    PT Frequency 2x / week    PT Duration 8 weeks    PT Treatment/Interventions Therapeutic activities;Therapeutic exercise;Neuromuscular re-education;Patient/family education;Manual techniques;Dry needling;Electrical Stimulation;Iontophoresis 4mg /ml Dexamethasone    PT Next Visit Plan posture, scapular, trunk, hip strengthening, manual techniques, modalities PRN    PT Home Exercise Plan Medbridge Access Code [facility]    Consulted and Agree with Plan of Care Patient           Patient will benefit from skilled therapeutic intervention in order to improve the following deficits and impairments:  Pain,Improper body mechanics,Postural dysfunction,Decreased strength,Difficulty walking,Decreased range of motion,Decreased activity tolerance  Visit Diagnosis: Chronic bilateral low back pain, unspecified whether sciatica present  Radiculopathy, lumbar region     Problem List Patient Active Problem List   Diagnosis Date Noted  . Dysphagia 07/10/2020  . Colon cancer screening   . Gastro-esophageal reflux disease without esophagitis 01/25/2015  . Other fatigue 01/25/2015  . Allergic rhinitis 01/31/2014  . Essential (primary) hypertension 01/31/2014    02/02/2014 PT, DPT   08/20/2020, 12:11 PM  [place] [facility] PHYSICAL AND SPORTS MEDICINE [address], [place] Phone: [phone]   Fax:  [phone]  Name: Kristin Mcdowell MRN: Ansel Bong Date of Birth: 24-Sep-1960

## 2020-08-20 NOTE — Patient Instructions (Signed)
Access Code: [facility] URL: [email or web address] Date: 08/20/2020 Prepared by: Loralyn Freshwater  Exercises Seated Piriformis Stretch - 3 x daily - 7 x weekly - 1 sets - 3 reps - 30 seconds hold Supine Posterior Pelvic Tilt - 1 x daily - 7 x weekly - 3 sets - 10 reps - 5 seconds hold Curl Up with Arms Crossed - 1 x daily - 7 x weekly - 3 sets - 10 reps - 5 seconds hold

## 2020-08-26 ENCOUNTER — Ambulatory Visit: Payer: Medicare Other

## 2020-08-28 ENCOUNTER — Ambulatory Visit: Payer: Medicare Other

## 2020-09-04 ENCOUNTER — Ambulatory Visit: Payer: Medicare Other

## 2020-09-09 ENCOUNTER — Other Ambulatory Visit: Payer: Self-pay

## 2020-09-09 ENCOUNTER — Ambulatory Visit: Payer: Medicare Other | Attending: Family Medicine

## 2020-09-09 DIAGNOSIS — M545 Low back pain, unspecified: Secondary | ICD-10-CM | POA: Diagnosis present

## 2020-09-09 DIAGNOSIS — M5416 Radiculopathy, lumbar region: Secondary | ICD-10-CM | POA: Insufficient documentation

## 2020-09-09 DIAGNOSIS — G8929 Other chronic pain: Secondary | ICD-10-CM | POA: Insufficient documentation

## 2020-09-09 NOTE — Therapy (Signed)
[place] [facility] [address], [place], [address] Phone: [phone]   Fax:  [phone]  Physical Therapy Treatment  Patient Details  Name: Kristin Mcdowell MRN: [id number] Date of Birth: 10-04-1960 Referring Provider (PT): Burman Freestone, NP   Encounter Date: 09/09/2020   PT End of Session - 09/09/20 1104    Visit Number 5    Number of Visits 17    Date for PT Re-Evaluation 10/03/20    PT Start Time 1104    PT Stop Time 1143    PT Time Calculation (min) 39 min    Activity Tolerance Patient limited by pain    Behavior During Therapy [facility] for tasks assessed/performed           Past Medical History:  Diagnosis Date  . Depression   . GERD (gastroesophageal reflux disease)   . Hypertension   . Seasonal allergies     Past Surgical History:  Procedure Laterality Date  . ABDOMINAL HYSTERECTOMY    . COLONOSCOPY WITH PROPOFOL N/A 08/30/2015   Procedure: COLONOSCOPY WITH PROPOFOL;  Surgeon: Midge Minium, MD;  Location: [facility];  Service: Endoscopy;  Laterality: N/A;  . COLONOSCOPY WITH PROPOFOL N/A 06/14/2020   Procedure: COLONOSCOPY WITH PROPOFOL;  Surgeon: Wyline Mood, MD;  Location: [facility] ENDOSCOPY;  Service: Gastroenterology;  Laterality: N/A;  . COLONOSCOPY WITH PROPOFOL N/A 07/22/2020   Procedure: COLONOSCOPY WITH PROPOFOL;  Surgeon: Wyline Mood, MD;  Location: [facility] ENDOSCOPY;  Service: Gastroenterology;  Laterality: N/A;  . ESOPHAGOGASTRODUODENOSCOPY (EGD) WITH PROPOFOL N/A 07/22/2020   Procedure: ESOPHAGOGASTRODUODENOSCOPY (EGD) WITH PROPOFOL;  Surgeon: Wyline Mood, MD;  Location: [facility] ENDOSCOPY;  Service: Gastroenterology;  Laterality: N/A;  . LUMBAR DISC SURGERY      There were no vitals filed for this visit.   Subjective Assessment - 09/09/20 1107    Subjective Feels better from her COVID.  Back has been hurting. Has been trying to do her exercises. Probably 8-9/10 currently.    Pertinent  History Low back pain. Has had surgery in 2018. Still having problems with L LE symptoms to ankle (aroudn L4/L5 dermatome). Has been getting ESI. Most recent injection did not really help. Pain is mainly on L side. Pain started on R side but pain switched to L side since getting her ESI. Had L4/L5 fusion 2018 and pt is still hurting. Denies loss of bowel or bladder control or saddle anesthesia. Sill has pain it her belly button area from the anterior approach for her lumbar fusion. Currently going to the pain clinic.    Patient Stated Goals Decrease pain.    Currently in Pain? Yes    Pain Score 8    8-9/10 currently.   Pain Onset More than a month ago                                     PT Education - 09/09/20 1109    Education Details ther-ex    [employer]) Educated Patient    Methods Explanation;Demonstration;Tactile cues;Verbal cues    Comprehension Returned demonstration;Verbalized understanding          Objective   No latex band allergies Takes blood pressure medicine  Back pain feels like pulling  MedbridgeAccess Code [facility]    Therapeutic exercise  Seated manually resisted thoracic extension in slight trunk flexion 10x3 with 5 second holds   Seated manually resisted lateral shift isometrics  to promote a more neutral posture   L 10x3 with 5 second holds in neutral  Decreased low back pain reported afterwards    Seated B shoulder extension isometrics, hands on thighs 10x3 with 5 second holds   Seated with slight trunk flexion  glute max squeeze 10x3 with 5 second holds    R hip extension isometrics 10x3 with 5 second holds    Manually resisted trunk flexion isometrics 10x5 seconds for 3 sets    Improved exercise technique, movement at target joints, use of target muscles after mod verbal, visual, tactile cues.  Response to treatment Pt tolerated session well without aggravation of symptoms.   Clinical  impression Decreasing overall starting low back pain levels observed from subjective reports. Continued promoting more neutral trunk posture as we as trunk and glute strength to help maintain posture and decrease stress to back. Pt tolerated session well. Back feels a little better after session per pt. Pt will benefit from continued skilled physical therapy services to decrease pain, improve strength and function.      PT Short Term Goals - 08/06/20 1256      PT SHORT TERM GOAL #1   Title Pt will be independent with her initial HEP to decrease pain, improve strength and function.    Baseline Pt has started her initial HEP (08/05/2020)    Time 3    Period Weeks    Status New    Target Date 08/30/20             PT Long Term Goals - 08/06/20 1257      PT LONG TERM GOAL #1   Title Pt will have a decrease in back pain to 6/10 or less at most to promote ability to tolerate standing and sitting, and picking up her laundry basket more comfortably.    Baseline 10/10 back pain at most for the past 3 months (08/06/2020)    Time 8    Period Weeks    Status New    Target Date 10/03/20      PT LONG TERM GOAL #2   Title Pt will improve bilateral hip extension and abduction strength by at least 1/2 MMT grade to promote ability to perform standing tasks more comfortably.    Baseline hip extension 4/5 B, hip abduction4+/5 B (08/06/2020)    Time 8    Period Weeks    Status New    Target Date 10/03/20      PT LONG TERM GOAL #3   Title Pt will improve her FOTO score by at least 10 points as a demonstration of improved function.    Baseline Lumbar spine FOTO 40 (08/06/2020)    Time 8    Period Weeks    Status New    Target Date 10/03/20                 Plan - 09/09/20 1106    Clinical Impression Statement Decreasing overall starting low back pain levels observed from subjective reports. Continued promoting more neutral trunk posture as we as trunk and glute strength to help maintain  posture and decrease stress to back. Pt tolerated session well. Back feels a little better after session per pt. Pt will benefit from continued skilled physical therapy services to decrease pain, improve strength and function.    Personal Factors and Comorbidities Comorbidity 3+;Fitness;Past/Current Experience;Time since onset of injury/illness/exacerbation    Comorbidities Depression, HTN, hx of back surgery, hx of abdominal hysterectomy    Examination-Activity  Limitations Bathing;Hygiene/Grooming;Bed Mobility;Lift;Bend;Stand;Caring for Others;Reach Overhead;Carry;Dressing    Stability/Clinical Decision Making Stable/Uncomplicated    Rehab Potential Fair    PT Frequency 2x / week    PT Duration 8 weeks    PT Treatment/Interventions Therapeutic activities;Therapeutic exercise;Neuromuscular re-education;Patient/family education;Manual techniques;Dry needling;Electrical Stimulation;Iontophoresis 4mg /ml Dexamethasone    PT Next Visit Plan posture, scapular, trunk, hip strengthening, manual techniques, modalities PRN    PT Home Exercise Plan Medbridge Access Code [facility]    Consulted and Agree with Plan of Care Patient           Patient will benefit from skilled therapeutic intervention in order to improve the following deficits and impairments:  Pain,Improper body mechanics,Postural dysfunction,Decreased strength,Difficulty walking,Decreased range of motion,Decreased activity tolerance  Visit Diagnosis: Chronic bilateral low back pain, unspecified whether sciatica present  Radiculopathy, lumbar region     Problem List Patient Active Problem List   Diagnosis Date Noted  . Dysphagia 07/10/2020  . Colon cancer screening   . Gastro-esophageal reflux disease without esophagitis 01/25/2015  . Other fatigue 01/25/2015  . Allergic rhinitis 01/31/2014  . Essential (primary) hypertension 01/31/2014    02/02/2014 PT, DPT   09/09/2020, 11:56 AM  [facility] [facility] PHYSICAL [facility] SPORTS MEDICINE [address], [place] Phone: [phone]   Fax:  [phone]  Name: Kristin Mcdowell MRN: Ansel Bong Date of Birth: [birth year]

## 2020-09-11 ENCOUNTER — Ambulatory Visit: Payer: Medicare Other

## 2020-09-16 ENCOUNTER — Other Ambulatory Visit: Payer: Self-pay

## 2020-09-16 ENCOUNTER — Ambulatory Visit: Payer: Medicare Other

## 2020-09-16 DIAGNOSIS — M545 Low back pain, unspecified: Secondary | ICD-10-CM

## 2020-09-16 DIAGNOSIS — M5416 Radiculopathy, lumbar region: Secondary | ICD-10-CM

## 2020-09-16 NOTE — Therapy (Signed)
[place] [facility] [address], [place], [address] Phone: [phone]   Fax:  [phone]  Physical Therapy Treatment  Patient Details  Name: Kristin Mcdowell MRN: [id number] Date of Birth: [birth year] Referring Provider (PT): Burman Freestone, NP   Encounter Date: 09/16/2020   PT End of Session - 09/16/20 1146     Visit Number 6    Number of Visits 17    Date for PT Re-Evaluation 10/03/20    PT Start Time 1146    PT Stop Time 1226    PT Time Calculation (min) 40 min    Activity Tolerance Patient limited by pain    Behavior During Therapy [facility] for tasks assessed/performed             Past Medical History:  Diagnosis Date   Depression    GERD (gastroesophageal reflux disease)    Hypertension    Seasonal allergies     Past Surgical History:  Procedure Laterality Date   ABDOMINAL HYSTERECTOMY     COLONOSCOPY WITH PROPOFOL N/A 08/30/2015   Procedure: COLONOSCOPY WITH PROPOFOL;  Surgeon: Midge Minium, MD;  Location: [facility];  Service: Endoscopy;  Laterality: N/A;   COLONOSCOPY WITH PROPOFOL N/A 06/14/2020   Procedure: COLONOSCOPY WITH PROPOFOL;  Surgeon: Wyline Mood, MD;  Location: [facility] END[facility]COPY;  Service: Gastroenterology;  Laterality: N/A;   COLONOSCOPY WITH PROPOFOL N/A 07/22/2020   Procedure: COLONOSCOPY WITH PROPOFOL;  Surgeon: Wyline Mood, MD;  Location: [facility] ENDOSCOPY;  Service: Gastroenterology;  Laterality: N/A;   ESOPHAGOGASTRODUODENOSCOPY (EGD) WITH PROPOFOL N/A 07/22/2020   Procedure: ESOPHAGOGASTRODUODENOSCOPY (EGD) WITH PROPOFOL;  Surgeon: Wyline Mood, MD;  Location: [facility];  Service: Gastroenterology;  Laterality: N/A;   LUMBAR DISC SURGERY      There were no vitals filed for this visit.   Subjective Assessment - 09/16/20 1147     Subjective Back is whining, not too bad, maybe about an 8/10 today but not too bad.    Pertinent History Low back pain. Has had surgery in  2018. Still having problems with L LE symptoms to ankle (aroudn L4/L5 dermatome). Has been getting ESI. Most recent injection did not really help. Pain is mainly on L side. Pain started on R side but pain switched to L side since getting her ESI. Had L4/L5 fusion 2018 and pt is still hurting. Denies loss of bowel or bladder control or saddle anesthesia. Sill has pain it her belly button area from the anterior approach for her lumbar fusion. Currently going to the pain clinic.    Patient Stated Goals Decrease pain.    Currently in Pain? Yes    Pain Score 8     Pain Onset More than a month ago                                       PT Education - 09/16/20 1150     Education Details ther-ex    [employer]) Educated Patient    Methods Explanation;Demonstration;Tactile cues;Verbal cues    Comprehension Returned demonstration;Verbalized understanding           Objective   No latex band allergies Takes blood pressure medicine   Back pain feels like pulling   Medbridge Access Code [facility]       Therapeutic exercise   Seated manually resisted thoracic extension in slight trunk flexion 10x3 with 5 second holds  Seated manually resisted lateral shift isometrics to promote a more neutral posture             L 10x3 with 5 second holds in neutral   Seated marches with transversus abdominis activation 10x3 each LE  Seated B shoulder extension isometrics, hands on thighs 10x3 with 5 second holds   Seated with slight trunk flexion             glute max squeeze 10x with 5 second holds   Seated B scapular retraction 10x5 seconds for 3 sets        Improved exercise technique, movement at target joints, use of target muscles after mod verbal, visual, tactile cues.     Manual therapy Seated STM B lumbar paraspinal muscle to decrease tension.     Response to treatment Fair tolerance to today's session.    Clinical impression Pt continues to demonstrate  decreasing overall starting low back pain levels compared to previous sessions. Continued working on posture, and trunk strength to decrease stress to low back. Pt will benefit from continued skilled physical therapy services to decrease pain, improve strength and function.             PT Short Term Goals - 08/06/20 1256       PT SHORT TERM GOAL #1   Title Pt will be independent with her initial HEP to decrease pain, improve strength and function.    Baseline Pt has started her initial HEP (08/05/2020)    Time 3    Period Weeks    Status New    Target Date 08/30/20               PT Long Term Goals - 08/06/20 1257       PT LONG TERM GOAL #1   Title Pt will have a decrease in back pain to 6/10 or less at most to promote ability to tolerate standing and sitting, and picking up her laundry basket more comfortably.    Baseline 10/10 back pain at most for the past 3 months (08/06/2020)    Time 8    Period Weeks    Status New    Target Date 10/03/20      PT LONG TERM GOAL #2   Title Pt will improve bilateral hip extension and abduction strength by at least 1/2 MMT grade to promote ability to perform standing tasks more comfortably.    Baseline hip extension 4/5 B, hip abduction4+/5 B (08/06/2020)    Time 8    Period Weeks    Status New    Target Date 10/03/20      PT LONG TERM GOAL #3   Title Pt will improve her FOTO score by at least 10 points as a demonstration of improved function.    Baseline Lumbar spine FOTO 40 (08/06/2020)    Time 8    Period Weeks    Status New    Target Date 10/03/20                   Plan - 09/16/20 1150     Clinical Impression Statement Pt continues to demonstrate decreasing overall starting low back pain levels compared to previous sessions. Continued working on posture, and trunk strength to decrease stress to low back. Pt will benefit from continued skilled physical therapy services to decrease pain, improve strength and function.     Personal Factors and Comorbidities Comorbidity 3+;Fitness;Past/Current Experience;Time since onset of injury/illness/exacerbation    Comorbidities Depression,  HTN, hx of back surgery, hx of abdominal hysterectomy    Examination-Activity Limitations Bathing;Hygiene/Grooming;Bed Mobility;Lift;Bend;Stand;Caring for Others;Reach Overhead;Carry;Dressing    Stability/Clinical Decision Making Stable/Uncomplicated    Rehab Potential Fair    PT Frequency 2x / week    PT Duration 8 weeks    PT Treatment/Interventions Therapeutic activities;Therapeutic exercise;Neuromuscular re-education;Patient/family education;Manual techniques;Dry needling;Electrical Stimulation;Iontophoresis 4mg /ml Dexamethasone    PT Next Visit Plan posture, scapular, trunk, hip strengthening, manual techniques, modalities PRN    PT Home Exercise Plan Medbridge Access Code [facility]    Consulted and Agree with Plan of Care Patient             Patient will benefit from skilled therapeutic intervention in order to improve the following deficits and impairments:  Pain, Improper body mechanics, Postural dysfunction, Decreased strength, Difficulty walking, Decreased range of motion, Decreased activity tolerance  Visit Diagnosis: Chronic bilateral low back pain, unspecified whether sciatica present  Radiculopathy, lumbar region     Problem List Patient Active Problem List   Diagnosis Date Noted   Dysphagia 07/10/2020   Colon cancer screening    Gastro-esophageal reflux disease without esophagitis 01/25/2015   Other fatigue 01/25/2015   Allergic rhinitis 01/31/2014   Essential (primary) hypertension 01/31/2014    02/02/2014 PT, DPT   09/16/2020, 12:54 PM  [place] [facility] PHYS[facility]TS MEDICINE [address], [place] Phone: [phone]   Fax:  [phone]  Name: Kristin Mcdowell MRN: Ansel Bong Date of Birth: 16-Feb-1961

## 2020-09-19 ENCOUNTER — Other Ambulatory Visit: Payer: Self-pay

## 2020-09-19 ENCOUNTER — Ambulatory Visit: Payer: Medicare Other

## 2020-09-19 DIAGNOSIS — M545 Low back pain, unspecified: Secondary | ICD-10-CM | POA: Diagnosis not present

## 2020-09-19 DIAGNOSIS — M5416 Radiculopathy, lumbar region: Secondary | ICD-10-CM

## 2020-09-19 DIAGNOSIS — G8929 Other chronic pain: Secondary | ICD-10-CM

## 2020-09-19 NOTE — Therapy (Signed)
[place] [facility] [address] [place], [address] Phone: [phone]   Fax:  [phone]  Physical Therapy Treatment  Patient Details  Name: Kristin Mcdowell MRN: [id number] Date of Birth: Aug 04, 1960 Referring Provider (PT): Burman Freestone, NP   Encounter Date: 09/19/2020   PT End of Session - 09/19/20 1501     Visit Number 7    Number of Visits 17    Date for PT Re-Evaluation 10/03/20    PT Start Time 1501    PT Stop Time 1543    PT Time Calculation (min) 42 min    Activity Tolerance Patient limited by pain    Behavior During Therapy [facility] for tasks assessed/performed             Past Medical History:  Diagnosis Date   Depression    GERD (gastroesophageal reflux disease)    Hypertension    Seasonal allergies     Past Surgical History:  Procedure Laterality Date   ABDOMINAL HYSTERECTOMY     COLONOSCOPY WITH PROPOFOL N/A 08/30/2015   Procedure: COLONOSCOPY WITH PROPOFOL;  Surgeon: Midge Minium, MD;  Location: [facility];  Service: Endoscopy;  Laterality: N/A;   COLONOSCOPY WITH PROPOFOL N/A 06/14/2020   Procedure: COLONOSCOPY WITH PROPOFOL;  Surgeon: Wyline Mood, MD;  Location: [facility] ENDOSCOPY;  Service: Gastroenterology;  Laterality: N/A;   COLONOSCOPY WITH PROPOFOL N/A 07/22/2020   Procedure: COLONOSCOPY WITH PROPOFOL;  Surgeon: Wyline Mood, MD;  Location: [facility] ENDOSCOPY;  Service: Gastroenterology;  Laterality: N/A;   ESOPHAGOGASTRODUODENOSCOPY (EGD) WITH PROPOFOL N/A 07/22/2020   Procedure: ESOPHAGOGASTRODUODENOSCOPY (EGD) WITH PROPOFOL;  Surgeon: Wyline Mood, MD;  Location: [facility] ENDOSCOPY;  Service: Gastroenterology;  Laterality: N/A;   LUMBAR DISC SURGERY      There were no vitals filed for this visit.   Subjective Assessment - 09/19/20 1503     Subjective Likes morning appointments better. Back has been hurting. 9/10 currently. Had to put some patches on last night. The exercises helps  for about a day but pain returns.    Pertinent History Low back pain. Has had surgery in 2018. Still having problems with L LE symptoms to ankle (aroudn L4/L5 dermatome). Has been getting ESI. Most recent injection did not really help. Pain is mainly on L side. Pain started on R side but pain switched to L side since getting her ESI. Had L4/L5 fusion 2018 and pt is still hurting. Denies loss of bowel or bladder control or saddle anesthesia. Sill has pain it her belly button area from the anterior approach for her lumbar fusion. Currently going to the pain clinic.    Patient Stated Goals Decrease pain.    Currently in Pain? Yes    Pain Score 9     Pain Onset More than a month ago                                       PT Education - 09/19/20 1508     Education Details ther-ex    Person(s) Educated Patient    Methods Explanation;Demonstration;Tactile cues;Verbal cues    Comprehension Returned demonstration;Verbalized understanding            Objective   No latex band allergies Takes blood pressure medicine   Back pain feels like pulling   Medbridge Access Code [facility]       Therapeutic exercise   Hooklying  posterior pelvic tilt 10x5 seconds for 2 sets  Crunches 10x5 seconds for 3 sets  Bent knee fallouts 8 x each LE  Increased time emphasis on proper technique and lumbopelvic and femoral control   Seated B shoulder extension isometrics, hands on thighs 10x2 with 5 second holds        Improved exercise technique, movement at target joints, use of target muscles after mod verbal, visual, tactile cues.         Manual therapy Seated STM B lumbar paraspinal muscle to decrease tension.      Response to treatment Fair tolerance to today's session.    Clinical impression Continued working on trunk strength, lumbopelvic stability and control to help decrease stress to low back. Fair tolerance to today's session. Pt will benefit from  continued skilled physical therapy services to decrease pain, improve strength and function.         PT Short Term Goals - 08/06/20 1256       PT SHORT TERM GOAL #1   Title Pt will be independent with her initial HEP to decrease pain, improve strength and function.    Baseline Pt has started her initial HEP (08/05/2020)    Time 3    Period Weeks    Status New    Target Date 08/30/20               PT Long Term Goals - 08/06/20 1257       PT LONG TERM GOAL #1   Title Pt will have a decrease in back pain to 6/10 or less at most to promote ability to tolerate standing and sitting, and picking up her laundry basket more comfortably.    Baseline 10/10 back pain at most for the past 3 months (08/06/2020)    Time 8    Period Weeks    Status New    Target Date 10/03/20      PT LONG TERM GOAL #2   Title Pt will improve bilateral hip extension and abduction strength by at least 1/2 MMT grade to promote ability to perform standing tasks more comfortably.    Baseline hip extension 4/5 B, hip abduction4+/5 B (08/06/2020)    Time 8    Period Weeks    Status New    Target Date 10/03/20      PT LONG TERM GOAL #3   Title Pt will improve her FOTO score by at least 10 points as a demonstration of improved function.    Baseline Lumbar spine FOTO 40 (08/06/2020)    Time 8    Period Weeks    Status New    Target Date 10/03/20                   Plan - 09/19/20 1501     Clinical Impression Statement Continued working on trunk strength, lumbopelvic stability and control to help decrease stress to low back. Fair tolerance to today's session. Pt will benefit from continued skilled physical therapy services to decrease pain, improve strength and function.    Personal Factors and Comorbidities Comorbidity 3+;Fitness;Past/Current Experience;Time since onset of injury/illness/exacerbation    Comorbidities Depression, HTN, hx of back surgery, hx of abdominal hysterectomy     Examination-Activity Limitations Bathing;Hygiene/Grooming;Bed Mobility;Lift;Bend;Stand;Caring for Others;Reach Overhead;Carry;Dressing    Stability/Clinical Decision Making Stable/Uncomplicated    Rehab Potential Fair    PT Frequency 2x / week    PT Duration 8 weeks    PT Treatment/Interventions Therapeutic activities;Therapeutic exercise;Neuromuscular re-education;Patient/family education;Manual techniques;Dry  needling;Electrical Stimulation;Iontophoresis 4mg /ml Dexamethasone    PT Next Visit Plan posture, scapular, trunk, hip strengthening, manual techniques, modalities PRN    PT Home Exercise Plan Medbridge Access Code [facility]    Consulted and Agree with Plan of Care Patient             Patient will benefit from skilled therapeutic intervention in order to improve the following deficits and impairments:  Pain, Improper body mechanics, Postural dysfunction, Decreased strength, Difficulty walking, Decreased range of motion, Decreased activity tolerance  Visit Diagnosis: Chronic bilateral low back pain, unspecified whether sciatica present  Radiculopathy, lumbar region     Problem List Patient Active Problem List   Diagnosis Date Noted   Dysphagia 07/10/2020   Colon cancer screening    Gastro-esophageal reflux disease without esophagitis 01/25/2015   Other fatigue 01/25/2015   Allergic rhinitis 01/31/2014   Essential (primary) hypertension 01/31/2014    02/02/2014 PT, DPT   09/19/2020, 7:08 PM  [place] [facility]TS [facility]ICINE [address], [address], [place] Phone: [phone]   Fax:  [phone]  Name: Kristin Mcdowell MRN: Ansel Bong Date of Birth: June 21, 1960

## 2020-09-20 ENCOUNTER — Encounter: Payer: Self-pay | Admitting: Cardiology

## 2020-09-20 ENCOUNTER — Ambulatory Visit (INDEPENDENT_AMBULATORY_CARE_PROVIDER_SITE_OTHER): Payer: Medicare Other | Admitting: Cardiology

## 2020-09-20 VITALS — BP 104/70 | HR 84 | Ht 66.0 in | Wt 172.0 lb

## 2020-09-20 DIAGNOSIS — G8929 Other chronic pain: Secondary | ICD-10-CM

## 2020-09-20 DIAGNOSIS — R002 Palpitations: Secondary | ICD-10-CM | POA: Diagnosis not present

## 2020-09-20 DIAGNOSIS — I10 Essential (primary) hypertension: Secondary | ICD-10-CM | POA: Diagnosis not present

## 2020-09-20 DIAGNOSIS — M549 Dorsalgia, unspecified: Secondary | ICD-10-CM

## 2020-09-20 NOTE — Patient Instructions (Signed)

## 2020-09-20 NOTE — Progress Notes (Signed)
Cardiology Office Note:    Date:  09/20/2020   ID:  Kristin Mcdowell, DOB September 15, 1960, MRN [id number]  PCP:  Leanna Sato, MD  Cardiologist:  Debbe Odea, MD  Electrophysiologist:  None   Referring MD: Leanna Sato, MD   Chief Complaint  Patient presents with   Other    6 month follow up. Meds reviewed verbally with patient.      History of Present Illness:    Kristin Mcdowell is a 60 y.o. female with a hx of hypertension, GERD,  who presents for follow-up.    Previously seen for chest pain and palpitations.  Work-up with echo and Lexiscan Myoview was unrevealing.  Cardiac monitor with no significant arrhythmias.  Symptoms of all improved/resolved.  Blood pressures are well controlled on current dose of amlodipine.  She has chronic low back issues, underwent surgery years ago.  Physical therapy has not been of much help.  Plans to follow-up with surgeon for additional input.  Blood pressures have been well controlled on current dose of amlodipine.   Prior notes Echocardiogram 03/2020 normal systolic and diastolic function, EF 60 to 65% Lexiscan Myoview 03/2020 no evidence for ischemia, low risk study. Cardiac monitor x 2weeks 03/2019 showed no significant arrhythmias, an episode of 8 beats of nonsustained VT occurred not associated with symptoms.  Patient triggered events were associated with sinus rhythm.  She has no other cardiac symptoms.     Past Medical History:  Diagnosis Date   Depression    GERD (gastroesophageal reflux disease)    Hypertension    Seasonal allergies     Past Surgical History:  Procedure Laterality Date   ABDOMINAL HYSTERECTOMY     COLONOSCOPY WITH PROPOFOL N/A 08/30/2015   Procedure: COLONOSCOPY WITH PROPOFOL;  Surgeon: Midge Minium, MD;  Location[facility];  Service: Endoscopy;  Laterality: N/A;   COLONOSCOPY WITH PROPOFOL N/A 06/14/2020   Procedure: COLONOSCOPY WITH PROPOFOL;  Surgeon: Wyline Mood, MD;  Location: [facility]  ENDOSCOPY;  Service: Gastroenterology;  Laterality: N/A;   COLONOSCOPY WITH PROPOFOL N/A 07/22/2020   Procedure: COLONOSCOPY WITH PROPOFOL;  Surgeon: Wyline Mood, MD;  Location: [facility] ENDOSCOPY;  Service: Gastroenterology;  Laterality: N/A;   ESOPHAGOGASTRODUODENOSCOPY (EGD) WITH PROPOFOL N/A 07/22/2020   Procedure: ESOPHAGOGASTRODUODENOSCOPY (EGD) WITH PROPOFOL;  Surgeon: Wyline Mood, MD;  Location: [facility] ENDOSCOPY;  Service: Gastroenterology;  Laterality: N/A;   LUMBAR DISC SURGERY      Current Medications: Current Meds  Medication Sig   acetaminophen (TYLENOL) 325 MG tablet Take 325 mg by mouth as needed.   amLODipine-atorvastatin (CADUET) 5-10 MG tablet Take by mouth.   COD LIVER OIL PO Take by mouth daily.   cyclobenzaprine (FLEXERIL) 10 MG tablet Take 10 mg by mouth 2 (two) times daily as needed for muscle spasms.   docusate sodium (COLACE) 100 MG capsule Take 100 mg by mouth 2 (two) times daily as needed for mild constipation.   lidocaine (LIDODERM) 5 % Place 1 patch onto the skin daily as needed. Remove & Discard patch within 12 hours or as directed by MD   Multiple Vitamin (MULTIVITAMIN) capsule Take 1 capsule by mouth daily.   omeprazole (PRILOSEC) 20 MG capsule Take 20 mg by mouth daily.     Allergies:   Penicillins   Social History   Socioeconomic History   Marital status: Single    Spouse name: Not on file   Number of children: Not on file   Years of education: Not on file  Highest education level: Not on file  Occupational History   Not on file  Tobacco Use   Smoking status: Never   Smokeless tobacco: Never  Vaping Use   Vaping Use: Never used  Substance and Sexual Activity   Alcohol use: No   Drug use: Never   Sexual activity: Not on file  Other Topics Concern   Not on file  Social History Narrative   Not on file   Social Determinants of Health   Financial Resource Strain: Not on file  Food Insecurity: Not on file  Transportation Needs: Not on file   Physical Activity: Not on file  Stress: Not on file  Social Connections: Not on file     Family History: The patient's family history includes Heart disease in her mother; Stroke in her brother. There is no history of Breast cancer.  ROS:   Please see the history of present illness.     All other systems reviewed and are negative.  EKGs/Labs/Other Studies Reviewed:    The following studies were reviewed today:   EKG:  EKG is ordered today.  EKG shows normal sinus rhythm  Recent Labs: 01/26/2020: ALT 16; BUN 17; Creatinine, Ser 1.15; Potassium 3.8; Sodium 138 05/31/2020: Hemoglobin 12.2; Platelets 255  Recent Lipid Panel No results found for: CHOL, TRIG, HDL, CHOLHDL, VLDL, LDLCALC, LDLDIRECT  Physical Exam:    VS:  BP 104/70 (BP Location: Left Arm, Patient Position: Sitting, Cuff Size: Normal)   Pulse 84   Ht 5\' 6"  (1.676 m)   Wt 172 lb (78 kg)   SpO2 98%   BMI 27.76 kg/m     Wt Readings from Last 3 Encounters:  09/20/20 172 lb (78 kg)  07/22/20 163 lb (73.9 kg)  07/10/20 170 lb (77.1 kg)     GEN:  Well nourished, well developed in no acute distress HEENT: Normal NECK: No JVD; No carotid bruits LYMPHATICS: No lymphadenopathy CARDIAC: RRR, no murmurs, rubs, gallops RESPIRATORY:  Clear to auscultation without rales, wheezing or rhonchi  ABDOMEN: Soft, non-tender, non-distended MUSCULOSKELETAL:  No edema; low back discomfort with movements. SKIN: Warm and dry NEUROLOGIC:  Alert and oriented x 3 PSYCHIATRIC:  Normal affect   ASSESSMENT:    1. Primary hypertension   2. Palpitations   3. Other chronic back pain     PLAN:    In order of problems listed above:  History of hypertension, BP controlled, continue amlodipine. Palpitations, cardiac monitor with no significant arrhythmias, symptoms now resolved.  Continue to monitor. Low back pain, management as per primary care physician, surgery.  Plans to follow-up with surgeon.  Follow-up as needed  This  note was generated in part or whole with voice recognition software. Voice recognition is usually quite accurate but there are transcription errors that can and very often do occur. I apologize for any typographical errors that were not detected and corrected.  Medication Adjustments/Labs and Tests Ordered: Current medicines are reviewed at length with the patient today.  Concerns regarding medicines are outlined above.  Orders Placed This Encounter  Procedures   EKG 12-Lead    No orders of the defined types were placed in this encounter.    Patient Instructions  Medication Instructions:  Your physician recommends that you continue on your current medications as directed. Please refer to the Current Medication list given to you today.  *If you need a refill on your cardiac medications before your next appointment, please call your pharmacy*   Lab Work: None ordered  If you have labs (blood work) drawn today and your tests are completely normal, you will receive your results only by: MyChart Message (if you have MyChart) OR A paper copy in the mail If you have any lab test that is abnormal or we need to change your treatment, we will call you to review the results.   Testing/Procedures: None ordered   Follow-Up: At [facility], you and your health needs are our priority.  As part of our continuing mission to provide you with exceptional heart care, we have created designated Provider Care Teams.  These Care Teams include your primary Cardiologist (physician) and Advanced Practice Providers (APPs -  Physician Assistants and Nurse Practitioners) who all work together to provide you with the care you need, when you need it.  We recommend signing up for the patient portal called "MyChart".  Sign up information is provided on this After Visit Summary.  MyChart is used to connect with patients for Virtual Visits (Telemedicine).  Patients are able to view lab/test results, encounter  notes, upcoming appointments, etc.  Non-urgent messages can be sent to your provider as well.   To learn more about what you can do with MyChart, go to [email or web address].    Your next appointment:   Follow up as needed   The format for your next appointment:   In Person  Provider:   Debbe Odea, MD   Other Instructions    Signed, Debbe Odea, MD  09/20/2020 11:29 AM    [facility]

## 2020-09-24 ENCOUNTER — Ambulatory Visit: Payer: Medicare Other

## 2020-09-27 ENCOUNTER — Encounter: Payer: Self-pay | Admitting: Oncology

## 2020-09-27 ENCOUNTER — Inpatient Hospital Stay (HOSPITAL_BASED_OUTPATIENT_CLINIC_OR_DEPARTMENT_OTHER): Payer: Medicare Other | Admitting: Oncology

## 2020-09-27 ENCOUNTER — Inpatient Hospital Stay: Payer: Medicare Other | Attending: Oncology

## 2020-09-27 ENCOUNTER — Other Ambulatory Visit: Payer: Self-pay

## 2020-09-27 VITALS — BP 116/85 | HR 75 | Temp 97.8°F | Resp 20 | Wt 170.0 lb

## 2020-09-27 DIAGNOSIS — K219 Gastro-esophageal reflux disease without esophagitis: Secondary | ICD-10-CM | POA: Insufficient documentation

## 2020-09-27 DIAGNOSIS — D709 Neutropenia, unspecified: Secondary | ICD-10-CM | POA: Insufficient documentation

## 2020-09-27 DIAGNOSIS — F32A Depression, unspecified: Secondary | ICD-10-CM | POA: Diagnosis not present

## 2020-09-27 DIAGNOSIS — I10 Essential (primary) hypertension: Secondary | ICD-10-CM | POA: Insufficient documentation

## 2020-09-27 DIAGNOSIS — R748 Abnormal levels of other serum enzymes: Secondary | ICD-10-CM | POA: Insufficient documentation

## 2020-09-27 LAB — CBC WITH DIFFERENTIAL/PLATELET
Abs Immature Granulocytes: 0 10*3/uL (ref 0.00–0.07)
Basophils Absolute: 0 10*3/uL (ref 0.0–0.1)
Basophils Relative: 1 %
Eosinophils Absolute: 0 10*3/uL (ref 0.0–0.5)
Eosinophils Relative: 1 %
HCT: 36.9 % (ref 36.0–46.0)
Hemoglobin: 12.1 g/dL (ref 12.0–15.0)
Immature Granulocytes: 0 %
Lymphocytes Relative: 41 %
Lymphs Abs: 1.5 10*3/uL (ref 0.7–4.0)
MCH: 26.9 pg (ref 26.0–34.0)
MCHC: 32.8 g/dL (ref 30.0–36.0)
MCV: 82.2 fL (ref 80.0–100.0)
Monocytes Absolute: 0.5 10*3/uL (ref 0.1–1.0)
Monocytes Relative: 13 %
Neutro Abs: 1.6 10*3/uL — ABNORMAL LOW (ref 1.7–7.7)
Neutrophils Relative %: 44 %
Platelets: 219 10*3/uL (ref 150–400)
RBC: 4.49 MIL/uL (ref 3.87–5.11)
RDW: 13.1 % (ref 11.5–15.5)
WBC: 3.7 10*3/uL — ABNORMAL LOW (ref 4.0–10.5)
nRBC: 0 % (ref 0.0–0.2)

## 2020-09-27 LAB — COMPREHENSIVE METABOLIC PANEL
ALT: 13 U/L (ref 0–44)
AST: 22 U/L (ref 15–41)
Albumin: 3.9 g/dL (ref 3.5–5.0)
Alkaline Phosphatase: 109 U/L (ref 38–126)
Anion gap: 10 (ref 5–15)
BUN: 14 mg/dL (ref 6–20)
CO2: 28 mmol/L (ref 22–32)
Calcium: 9.6 mg/dL (ref 8.9–10.3)
Chloride: 102 mmol/L (ref 98–111)
Creatinine, Ser: 1.01 mg/dL — ABNORMAL HIGH (ref 0.44–1.00)
GFR, Estimated: >60 mL/min (ref >60)
Glucose, Bld: 130 mg/dL — ABNORMAL HIGH (ref 70–99)
Potassium: 4 mmol/L (ref 3.5–5.1)
Sodium: 140 mmol/L (ref 135–145)
Total Bilirubin: 0.6 mg/dL (ref 0.3–1.2)
Total Protein: 7.4 g/dL (ref 6.5–8.1)

## 2020-09-27 NOTE — Progress Notes (Signed)
Patient states she believes she was bite by a tick on 6/19.

## 2020-09-28 NOTE — Progress Notes (Signed)
Hematology/Oncology Consult note [facility]  Telephone:([phone] [phone]  Patient Care Team: Marguerita Merles, MD as PCP - General (Family Medicine) Kate Sable, MD as PCP - Cardiology (Cardiology)   Name of the patient: Kristin Mcdowell  [id number]  [birth year]   Date of visit: 09/28/20  Diagnosis- elevated alkaline phosphatase level ow resolved  Chronic neutropenia likely benign  Chief complaint/ Reason for visit-routine follow-up of neutropenia and elevated alkaline phosphatase  Heme/Onc history: Patient is a 60 yr old female with past medical history of GERD depression has been referred for elevated alkaline phosphatase level. Recent alk phos on 04/06/18 elevated at 156. AST, ALT, GGT normal. TSH normal. Vit D level low at 28.   bloodwork from 04/17/19 showed alk phos elevated at 173. Fractionated alk phos was normal. Calcium mildly elevated at 10.3 and PTH normal at 32.    Patient also has chronic isolated mild neutropenia which is being followed conservatively without a bone marrow biopsy    Interval history-patient was evaluated by GI for symptoms of constipation.  She was supposed to get Trulance prescription but states that she had a problem getting it and never landed up taking the medication.  Presently constipation is not much of an issue for her.  She also believes that she may have had a tick bite on 09/22/2020.  This was followed by a skin rash which subsequently improved.  Currently she reports no complaints  ECOG PS- 0 Pain scale- 0   Review of systems- Review of Systems  Constitutional:  Negative for chills, fever, malaise/fatigue and weight loss.  HENT:  Negative for congestion, ear discharge and nosebleeds.   Eyes:  Negative for blurred vision.  Respiratory:  Negative for cough, hemoptysis, sputum production, shortness of breath and wheezing.   Cardiovascular:  Negative for chest pain, palpitations, orthopnea and  claudication.  Gastrointestinal:  Negative for abdominal pain, blood in stool, constipation, diarrhea, heartburn, melena, nausea and vomiting.  Genitourinary:  Negative for dysuria, flank pain, frequency, hematuria and urgency.  Musculoskeletal:  Negative for back pain, joint pain and myalgias.  Skin:  Negative for rash.  Neurological:  Negative for dizziness, tingling, focal weakness, seizures, weakness and headaches.  Endo/Heme/Allergies:  Does not bruise/bleed easily.  Psychiatric/Behavioral:  Negative for depression and suicidal ideas. The patient does not have insomnia.      Allergies  Allergen Reactions   Penicillins     By test Other reaction(s): Other (See Comments) By test     Past Medical History:  Diagnosis Date   Depression    GERD (gastroesophageal reflux disease)    Hypertension    Seasonal allergies      Past Surgical History:  Procedure Laterality Date   ABDOMINAL HYSTERECTOMY     COLONOSCOPY WITH PROPOFOL N/A 08/30/2015   Procedure: COLONOSCOPY WITH PROPOFOL;  Surgeon: Lucilla Lame, MD;  Location: [facility];  Service: Endoscopy;  Laterality: N/A;   COLONOSCOPY WITH PROPOFOL N/A 06/14/2020   Procedure: COLONOSCOPY WITH PROPOFOL;  Surgeon: [name], MD;  Location: [facility] ENDOSCOPY;  Service: Gastroenterology;  Laterality: N/A;   COLONOSCOPY WITH PROPOFOL N/A 07/22/2020   Procedure: COLONOSCOPY WITH PROPOFOL;  Surgeon: [name], MD;  Location: [facility] ENDOSCOPY;  Service: Gastroenterology;  Laterality: N/A;   ESOPHAGOGASTRODUODENOSCOPY (EGD) WITH PROPOFOL N/A 07/22/2020   Procedure: ESOPHAGOGASTRODUODENOSCOPY (EGD) WITH PROPOFOL;  Surgeon: [name], MD;  Location: [facility] ENDOSCOPY;  Service: Gastroenterology;  Laterality: N/A;   LUMBAR DISC SURGERY  Social History   Socioeconomic History   Marital status: Single    Spouse name: Not on file   Number of children: Not on file   Years of education: Not on file   Highest education level: Not on  file  Occupational History   Not on file  Tobacco Use   Smoking status: Never   Smokeless tobacco: Never  Vaping Use   Vaping Use: Never used  Substance and Sexual Activity   Alcohol use: No   Drug use: Never   Sexual activity: Not on file  Other Topics Concern   Not on file  Social History Narrative   Not on file   Social Determinants of Health   Financial Resource Strain: Not on file  Food Insecurity: Not on file  Transportation Needs: Not on file  Physical Activity: Not on file  Stress: Not on file  Social Connections: Not on file  Intimate Partner Violence: Not on file    Family History  Problem Relation Age of Onset   Heart disease Mother    Stroke Brother    Breast cancer Neg Hx      Current Outpatient Medications:    acetaminophen (TYLENOL) 325 MG tablet, Take 325 mg by mouth as needed., Disp: , Rfl:    amLODipine-atorvastatin (CADUET) 5-10 MG tablet, Take by mouth., Disp: , Rfl:    COD LIVER OIL PO, Take by mouth daily., Disp: , Rfl:    cyclobenzaprine (FLEXERIL) 10 MG tablet, Take 10 mg by mouth 2 (two) times daily as needed for muscle spasms., Disp: , Rfl:    docusate sodium (COLACE) 100 MG capsule, Take 100 mg by mouth 2 (two) times daily as needed for mild constipation., Disp: , Rfl:    lidocaine (LIDODERM) 5 %, Place 1 patch onto the skin daily as needed. Remove & Discard patch within 12 hours or as directed by MD, Disp: , Rfl:    Multiple Vitamin (MULTIVITAMIN) capsule, Take 1 capsule by mouth daily., Disp: , Rfl:    omeprazole (PRILOSEC) 20 MG capsule, Take 20 mg by mouth daily., Disp: , Rfl:   Physical exam:  Vitals:   09/27/20 1137  BP: 116/85  Pulse: 75  Resp: 20  Temp: 97.8 F (36.6 C)  SpO2: 100%  Weight: 170 lb (77.1 kg)   Physical Exam Eyes:     Pupils: Pupils are equal, round, and reactive to light.  Cardiovascular:     Rate and Rhythm: Normal rate and regular rhythm.     Heart sounds: Normal heart sounds.  Pulmonary:     Effort:  Pulmonary effort is normal.     Breath sounds: Normal breath sounds.  Abdominal:     General: Bowel sounds are normal.     Palpations: Abdomen is soft.  Skin:    General: Skin is warm and dry.  Neurological:     Mental Status: She is alert and oriented to person, place, and time.     CMP Latest Ref Rng & Units 09/27/2020  Glucose 70 - 99 mg/dL 130(H)  BUN 6 - 20 mg/dL 14  Creatinine 0.44 - 1.00 mg/dL 1.01(H)  Sodium 135 - 145 mmol/L 140  Potassium 3.5 - 5.1 mmol/L 4.0  Chloride 98 - 111 mmol/L 102  CO2 22 - 32 mmol/L 28  Calcium 8.9 - 10.3 mg/dL 9.6  Total Protein 6.5 - 8.1 g/dL 7.4  Total Bilirubin 0.3 - 1.2 mg/dL 0.6  Alkaline Phos 38 - 126 U/L 109  AST 15 -  41 U/L 22  ALT 0 - 44 U/L 13   CBC Latest Ref Rng & Units 09/27/2020  WBC 4.0 - 10.5 K/uL 3.7(L)  Hemoglobin 12.0 - 15.0 g/dL 12.1  Hematocrit 36.0 - 46.0 % 36.9  Platelets 150 - 400 K/uL 219     Assessment and plan- Patient is a 60 y.o. female who is here for f/u of neutropenia and elevated alkaline phosphatase  Patient's neutropenia waxes and wanes but has remained stable Over the last 1 year.  Her [place] fluctuates between 1.3-1.7.  No other cytopenias.  Given stability of her counts she can continue to follow-up with Dr. Lennox Grumbles and can be referred to [place] in the future if questions or concerns arise.  Patient was noted to have elevated alkaline phosphatase which has now normalized and has remained active in the last 2 to 3 months checks.  No need to monitor that at this time   Visit Diagnosis 1. Chronic neutropenia (HCC)      Dr. Randa Evens, MD, MPH [facility] [phone] 09/28/2020 5:05 PM

## 2020-10-16 ENCOUNTER — Ambulatory Visit (INDEPENDENT_AMBULATORY_CARE_PROVIDER_SITE_OTHER): Payer: Medicare Other | Admitting: Gastroenterology

## 2020-10-16 ENCOUNTER — Encounter: Payer: Self-pay | Admitting: Gastroenterology

## 2020-10-16 ENCOUNTER — Other Ambulatory Visit: Payer: Self-pay

## 2020-10-16 VITALS — BP 126/87 | HR 73 | Temp 98.1°F | Wt 173.0 lb

## 2020-10-16 DIAGNOSIS — K5900 Constipation, unspecified: Secondary | ICD-10-CM | POA: Diagnosis not present

## 2020-10-16 MED ORDER — TRULANCE 3 MG PO TABS: 1.0000 | ORAL_TABLET | Freq: Every day | ORAL | 3 refills | Status: DC

## 2020-10-16 NOTE — Progress Notes (Signed)
Wyline Mood MD, MRCP(U.K) [address]  [address]  [place], [place] [address]  Main: [phone]  Fax: [phone]   Primary Care Physician: Leanna Sato, MD  Primary Gastroenterologist:  Dr. Wyline Mood   Chief complaint: Follow-up for constipation   HPI: SMRITHI PIGFORD is a 60 y.o. female  Summary of history :   Initially referred and seen in January 2022 for colon cancer screening and bloating ongoing for past few years.  CT scan of the abdomen in January 2021 showed no abdominal abnormalities.  She could go up to 4 weeks without a bowel movement.  She felt much better when she had a good bowel movement.  Commenced on a high-fiber diet along with Trulance samples of which were provided. 06/14/2020: Colonoscopy: Stool is seen in the rectum sigmoid colon and descending colon hence procedure was inadequate    Interval history 07/10/2020-10/16/2020   At last visit complained of dysphagia.  Provided a 90-day prescription of Trulance  07/22/2020 EGD: Normal appearance biopsies taken to rule out EOE and a colonoscopy was performed for constipation and showed no abnormalities biopsies of the esophagus showed no abnormalities.   At her last visit we prescribed her Trulance she says that it never arrived at a pharmacy and hence did not start on it as suggested at her last visit.  And she is not having rate but regular bowel movements.    Current Outpatient Medications  Medication Sig Dispense Refill   acetaminophen (TYLENOL) 325 MG tablet Take 325 mg by mouth as needed.     amLODipine-atorvastatin (CADUET) 5-10 MG tablet Take by mouth.     COD LIVER OIL PO Take by mouth daily.     cyclobenzaprine (FLEXERIL) 10 MG tablet Take 10 mg by mouth 2 (two) times daily as needed for muscle spasms.     docusate sodium (COLACE) 100 MG capsule Take 100 mg by mouth 2 (two) times daily as needed for mild constipation.     lidocaine (LIDODERM) 5 % Place 1 patch onto the skin daily as needed.  Remove & Discard patch within 12 hours or as directed by MD     Multiple Vitamin (MULTIVITAMIN) capsule Take 1 capsule by mouth daily.     omeprazole (PRILOSEC) 20 MG capsule Take 20 mg by mouth daily.     No current facility-administered medications for this visit.    Allergies as of 10/16/2020 - Review Complete 10/16/2020  Allergen Reaction Noted   Penicillins  09/23/2012    ROS:  General: Negative for anorexia, weight loss, fever, chills, fatigue, weakness. ENT: Negative for hoarseness, difficulty swallowing , nasal congestion. CV: Negative for chest pain, angina, palpitations, dyspnea on exertion, peripheral edema.  Respiratory: Negative for dyspnea at rest, dyspnea on exertion, cough, sputum, wheezing.  GI: See history of present illness. GU:  Negative for dysuria, hematuria, urinary incontinence, urinary frequency, nocturnal urination.  Endo: Negative for unusual weight change.    Physical Examination:   BP 126/87   Pulse 73   Temp 98.1 F (36.7 C) (Oral)   Wt 173 lb (78.5 kg)   BMI 27.92 kg/m   General: Well-nourished, well-developed in no acute distress.  Eyes: No icterus. Conjunctivae pink. Neuro: Alert and oriented x 3.  Grossly intact. Skin: Warm and dry, no jaundice.   Psych: Alert and cooperative, normal mood and affect.   Imaging Studies: No results found.  Assessment and Plan:   SHAMIA UPPAL is a 60 y.o. y/o female here  today to see me for follow-up of severe constipation.  At her last visit plan was to start her on Trulance with sending the prescription she states that it was not available at the pharmacy hence did not start it and presently is not taking anything for her constipation.   I will send another prescription of Trulance and give her 1 week sample and advised her to take it regularly and if it does not work to give [place] a call and return back      Dr Wyline Mood  MD,MRCP [facility]) Follow up in as

## 2020-10-28 ENCOUNTER — Other Ambulatory Visit: Payer: Self-pay

## 2020-10-28 ENCOUNTER — Telehealth: Payer: Self-pay | Admitting: Gastroenterology

## 2020-10-28 MED ORDER — LINACLOTIDE 290 MCG PO CAPS: 290.0000 ug | ORAL_CAPSULE | Freq: Every day | ORAL | 3 refills | Status: AC

## 2020-10-28 NOTE — Telephone Encounter (Signed)
Plecanatide (TRULANCE) 3 MG TABS  Patient called and Trulance was too expensive $1900. What else can [name] do? Please advise.

## 2020-10-28 NOTE — Telephone Encounter (Signed)
Tried contacting pt regarding switching Trulance to Linzess due to cost. Not able to leave a message.   Sent patient a [employer] geologist.

## 2020-11-14 ENCOUNTER — Other Ambulatory Visit: Payer: Self-pay

## 2020-11-14 ENCOUNTER — Emergency Department
Admission: EM | Admit: 2020-11-14 | Discharge: 2020-11-14 | Disposition: A | Discharge status: Home / Self Care | Payer: No Typology Code available for payment source | Attending: Emergency Medicine | Admitting: Emergency Medicine

## 2020-11-14 DIAGNOSIS — S39012A Strain of muscle, fascia and tendon of lower back, initial encounter: Secondary | ICD-10-CM | POA: Insufficient documentation

## 2020-11-14 DIAGNOSIS — S161XXA Strain of muscle, fascia and tendon at neck level, initial encounter: Secondary | ICD-10-CM | POA: Insufficient documentation

## 2020-11-14 DIAGNOSIS — I10 Essential (primary) hypertension: Secondary | ICD-10-CM | POA: Insufficient documentation

## 2020-11-14 DIAGNOSIS — S199XXA Unspecified injury of neck, initial encounter: Secondary | ICD-10-CM | POA: Diagnosis present

## 2020-11-14 MED ORDER — CYCLOBENZAPRINE HCL 5 MG PO TABS: 5.0000 mg | ORAL_TABLET | Freq: Three times a day (TID) | ORAL | 0 refills | Status: DC | PRN

## 2020-11-14 MED ORDER — ORPHENADRINE CITRATE 30 MG/ML IJ SOLN
60.0000 mg | Freq: Once | INTRAMUSCULAR | Status: AC
  Administered 2020-11-14: 60 mg via INTRAMUSCULAR
  Filled 2020-11-14: qty 2

## 2020-11-14 MED ORDER — ACETAMINOPHEN 500 MG PO TABS
1000.0000 mg | ORAL_TABLET | Freq: Once | ORAL | Status: AC
  Administered 2020-11-14: 1000 mg via ORAL
  Filled 2020-11-14: qty 2

## 2020-11-14 MED ORDER — PREDNISONE 10 MG PO TABS: 10.0000 mg | ORAL_TABLET | Freq: Every day | ORAL | 0 refills | Status: DC

## 2020-11-14 MED ORDER — HYDROCODONE-ACETAMINOPHEN 5-325 MG PO TABS: 1.0000 | ORAL_TABLET | ORAL | 0 refills | Status: DC | PRN

## 2020-11-14 NOTE — Discharge Instructions (Addendum)
Please take medications as prescribed.  Call orthopedic office to schedule follow-up appointment in 1 week.  Return to the ER for any worsening symptoms or urgent changes in your health.
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
## 2020-12-12 ENCOUNTER — Ambulatory Visit: Payer: Medicare Other | Attending: Family Medicine

## 2020-12-12 DIAGNOSIS — M542 Cervicalgia: Secondary | ICD-10-CM

## 2020-12-12 DIAGNOSIS — G8929 Other chronic pain: Secondary | ICD-10-CM | POA: Diagnosis present

## 2020-12-12 DIAGNOSIS — M545 Low back pain, unspecified: Secondary | ICD-10-CM

## 2020-12-12 DIAGNOSIS — M5412 Radiculopathy, cervical region: Secondary | ICD-10-CM

## 2020-12-12 DIAGNOSIS — M5416 Radiculopathy, lumbar region: Secondary | ICD-10-CM | POA: Diagnosis present
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
## 2020-12-18 ENCOUNTER — Ambulatory Visit: Payer: Medicare Other

## 2020-12-18 DIAGNOSIS — M545 Low back pain, unspecified: Secondary | ICD-10-CM

## 2020-12-18 DIAGNOSIS — M542 Cervicalgia: Secondary | ICD-10-CM | POA: Diagnosis not present

## 2020-12-18 DIAGNOSIS — M5412 Radiculopathy, cervical region: Secondary | ICD-10-CM

## 2020-12-18 DIAGNOSIS — M5416 Radiculopathy, lumbar region: Secondary | ICD-10-CM

## 2020-12-18 DIAGNOSIS — G8929 Other chronic pain: Secondary | ICD-10-CM

## 2020-12-18 NOTE — Patient Instructions (Signed)
Access Code: [email or web address] URL: [email or web address] Date: 12/18/2020 Prepared by: Loralyn Freshwater  Exercises Supine Head Nod Deep Neck Flexor Training - 3 x daily - 7 x weekly - 3 sets - 10 reps
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
## 2020-12-25 ENCOUNTER — Ambulatory Visit: Payer: Medicare Other

## 2020-12-25 ENCOUNTER — Other Ambulatory Visit: Payer: Self-pay

## 2020-12-25 ENCOUNTER — Other Ambulatory Visit: Payer: Self-pay | Admitting: Family Medicine

## 2020-12-25 DIAGNOSIS — M542 Cervicalgia: Secondary | ICD-10-CM | POA: Diagnosis not present

## 2020-12-25 DIAGNOSIS — Z1231 Encounter for screening mammogram for malignant neoplasm of breast: Secondary | ICD-10-CM

## 2020-12-25 DIAGNOSIS — M5412 Radiculopathy, cervical region: Secondary | ICD-10-CM
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
## 2020-12-26 ENCOUNTER — Ambulatory Visit: Payer: Medicare Other

## 2020-12-26 DIAGNOSIS — G8929 Other chronic pain: Secondary | ICD-10-CM

## 2020-12-26 DIAGNOSIS — M5416 Radiculopathy, lumbar region: Secondary | ICD-10-CM

## 2020-12-26 DIAGNOSIS — M5412 Radiculopathy, cervical region: Secondary | ICD-10-CM

## 2020-12-26 DIAGNOSIS — M542 Cervicalgia: Secondary | ICD-10-CM

## 2020-12-26 DIAGNOSIS — M545 Low back pain, unspecified: Secondary | ICD-10-CM
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
## 2021-01-06 ENCOUNTER — Ambulatory Visit: Payer: Medicare Other | Attending: Family Medicine

## 2021-01-06 DIAGNOSIS — M545 Low back pain, unspecified: Secondary | ICD-10-CM | POA: Diagnosis not present

## 2021-01-06 DIAGNOSIS — M542 Cervicalgia: Secondary | ICD-10-CM | POA: Diagnosis present

## 2021-01-06 DIAGNOSIS — G8929 Other chronic pain: Secondary | ICD-10-CM | POA: Insufficient documentation

## 2021-01-06 DIAGNOSIS — M5416 Radiculopathy, lumbar region: Secondary | ICD-10-CM | POA: Insufficient documentation

## 2021-01-06 DIAGNOSIS — M5412 Radiculopathy, cervical region: Secondary | ICD-10-CM | POA: Insufficient documentation
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
## 2021-01-08 ENCOUNTER — Ambulatory Visit: Payer: Medicare Other

## 2021-01-08 DIAGNOSIS — M545 Low back pain, unspecified: Secondary | ICD-10-CM | POA: Diagnosis not present

## 2021-01-08 DIAGNOSIS — M542 Cervicalgia: Secondary | ICD-10-CM

## 2021-01-08 DIAGNOSIS — M5412 Radiculopathy, cervical region: Secondary | ICD-10-CM

## 2021-01-08 DIAGNOSIS — M5416 Radiculopathy, lumbar region: Secondary | ICD-10-CM

## 2021-01-08 DIAGNOSIS — G8929 Other chronic pain: Secondary | ICD-10-CM
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
## 2021-01-13 ENCOUNTER — Ambulatory Visit: Payer: Medicare Other

## 2021-01-13 DIAGNOSIS — M5416 Radiculopathy, lumbar region: Secondary | ICD-10-CM

## 2021-01-13 DIAGNOSIS — M545 Low back pain, unspecified: Secondary | ICD-10-CM

## 2021-01-13 DIAGNOSIS — G8929 Other chronic pain: Secondary | ICD-10-CM

## 2021-01-13 DIAGNOSIS — M542 Cervicalgia: Secondary | ICD-10-CM

## 2021-01-13 DIAGNOSIS — M5412 Radiculopathy, cervical region: Secondary | ICD-10-CM
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
## 2021-01-15 ENCOUNTER — Ambulatory Visit: Payer: Medicare Other

## 2021-01-15 DIAGNOSIS — M545 Low back pain, unspecified: Secondary | ICD-10-CM | POA: Diagnosis not present

## 2021-01-15 DIAGNOSIS — M5416 Radiculopathy, lumbar region: Secondary | ICD-10-CM

## 2021-01-15 DIAGNOSIS — M5412 Radiculopathy, cervical region: Secondary | ICD-10-CM

## 2021-01-15 DIAGNOSIS — M542 Cervicalgia: Secondary | ICD-10-CM
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
## 2021-01-20 ENCOUNTER — Ambulatory Visit: Payer: Medicare Other

## 2021-01-20 DIAGNOSIS — G8929 Other chronic pain: Secondary | ICD-10-CM

## 2021-01-20 DIAGNOSIS — M5412 Radiculopathy, cervical region: Secondary | ICD-10-CM

## 2021-01-20 DIAGNOSIS — M5416 Radiculopathy, lumbar region: Secondary | ICD-10-CM

## 2021-01-20 DIAGNOSIS — M545 Low back pain, unspecified: Secondary | ICD-10-CM | POA: Diagnosis not present

## 2021-01-20 DIAGNOSIS — M542 Cervicalgia: Secondary | ICD-10-CM
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
## 2021-01-22 ENCOUNTER — Ambulatory Visit: Payer: Medicare Other

## 2021-01-22 DIAGNOSIS — M542 Cervicalgia: Secondary | ICD-10-CM

## 2021-01-22 DIAGNOSIS — M545 Low back pain, unspecified: Secondary | ICD-10-CM | POA: Diagnosis not present

## 2021-01-22 DIAGNOSIS — M5412 Radiculopathy, cervical region: Secondary | ICD-10-CM

## 2021-01-22 DIAGNOSIS — G8929 Other chronic pain: Secondary | ICD-10-CM

## 2021-01-22 DIAGNOSIS — M5416 Radiculopathy, lumbar region: Secondary | ICD-10-CM
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
## 2021-01-23 ENCOUNTER — Other Ambulatory Visit: Payer: Self-pay

## 2021-01-23 ENCOUNTER — Ambulatory Visit
Admission: RE | Admit: 2021-01-23 | Discharge: 2021-01-23 | Disposition: A | Discharge status: Home / Self Care | Payer: Medicare Other | Source: Ambulatory Visit | Attending: Family Medicine | Admitting: Family Medicine

## 2021-01-23 DIAGNOSIS — Z1231 Encounter for screening mammogram for malignant neoplasm of breast: Secondary | ICD-10-CM | POA: Diagnosis present

## 2021-01-27 ENCOUNTER — Ambulatory Visit: Payer: Medicare Other

## 2021-01-27 DIAGNOSIS — M542 Cervicalgia: Secondary | ICD-10-CM

## 2021-01-27 DIAGNOSIS — M5416 Radiculopathy, lumbar region: Secondary | ICD-10-CM

## 2021-01-27 DIAGNOSIS — M545 Low back pain, unspecified: Secondary | ICD-10-CM | POA: Diagnosis not present

## 2021-01-27 DIAGNOSIS — M5412 Radiculopathy, cervical region: Secondary | ICD-10-CM

## 2021-01-27 DIAGNOSIS — G8929 Other chronic pain: Secondary | ICD-10-CM
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
## 2021-01-29 ENCOUNTER — Ambulatory Visit: Payer: Medicare Other

## 2021-01-29 DIAGNOSIS — M542 Cervicalgia: Secondary | ICD-10-CM

## 2021-01-29 DIAGNOSIS — M545 Low back pain, unspecified: Secondary | ICD-10-CM | POA: Diagnosis not present

## 2021-01-29 DIAGNOSIS — M5412 Radiculopathy, cervical region: Secondary | ICD-10-CM

## 2021-01-29 DIAGNOSIS — G8929 Other chronic pain: Secondary | ICD-10-CM

## 2021-01-29 DIAGNOSIS — M5416 Radiculopathy, lumbar region: Secondary | ICD-10-CM
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
## 2021-02-03 ENCOUNTER — Ambulatory Visit: Payer: Medicare Other

## 2021-02-03 DIAGNOSIS — M545 Low back pain, unspecified: Secondary | ICD-10-CM | POA: Diagnosis not present

## 2021-02-03 DIAGNOSIS — G8929 Other chronic pain: Secondary | ICD-10-CM

## 2021-02-03 DIAGNOSIS — M5416 Radiculopathy, lumbar region: Secondary | ICD-10-CM
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
## 2021-02-05 ENCOUNTER — Ambulatory Visit: Payer: Medicare Other | Attending: Family Medicine

## 2021-02-05 DIAGNOSIS — M5416 Radiculopathy, lumbar region: Secondary | ICD-10-CM | POA: Insufficient documentation

## 2021-02-05 DIAGNOSIS — M5412 Radiculopathy, cervical region: Secondary | ICD-10-CM | POA: Diagnosis present

## 2021-02-05 DIAGNOSIS — G8929 Other chronic pain: Secondary | ICD-10-CM | POA: Insufficient documentation

## 2021-02-05 DIAGNOSIS — M542 Cervicalgia: Secondary | ICD-10-CM | POA: Insufficient documentation

## 2021-02-05 DIAGNOSIS — M545 Low back pain, unspecified: Secondary | ICD-10-CM | POA: Diagnosis not present
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
## 2021-02-11 ENCOUNTER — Ambulatory Visit: Payer: Medicare Other

## 2021-02-11 DIAGNOSIS — M545 Low back pain, unspecified: Secondary | ICD-10-CM | POA: Diagnosis not present

## 2021-02-11 DIAGNOSIS — G8929 Other chronic pain: Secondary | ICD-10-CM

## 2021-02-11 DIAGNOSIS — M542 Cervicalgia: Secondary | ICD-10-CM

## 2021-02-11 DIAGNOSIS — M5412 Radiculopathy, cervical region: Secondary | ICD-10-CM

## 2021-02-11 DIAGNOSIS — M5416 Radiculopathy, lumbar region: Secondary | ICD-10-CM

## 2021-02-11 NOTE — Patient Instructions (Signed)
Access Code: [email or web address] URL: [email or web address] Date: 02/11/2021 Prepared by: Loralyn Freshwater  Exercises Supine Head Nod Deep Neck Flexor Training - 3 x daily - 7 x weekly - 3 sets - 10 reps Supine Scapular Retraction - 3 x daily - 7 x weekly - 3 sets - 10 reps Supine March - 1 x daily - 7 x weekly - 3 sets - 10 reps Supine Posterior Pelvic Tilt - 2 x daily - 7 x weekly - 3 sets - 10 reps - 5 seconds hold
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
## 2021-02-13 ENCOUNTER — Ambulatory Visit: Payer: Medicare Other

## 2021-02-13 DIAGNOSIS — G8929 Other chronic pain: Secondary | ICD-10-CM

## 2021-02-13 DIAGNOSIS — M545 Low back pain, unspecified: Secondary | ICD-10-CM

## 2021-02-13 DIAGNOSIS — M542 Cervicalgia: Secondary | ICD-10-CM

## 2021-02-13 DIAGNOSIS — M5412 Radiculopathy, cervical region: Secondary | ICD-10-CM

## 2021-02-13 DIAGNOSIS — M5416 Radiculopathy, lumbar region: Secondary | ICD-10-CM
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
## 2021-02-18 ENCOUNTER — Ambulatory Visit: Payer: Medicare Other

## 2021-02-18 DIAGNOSIS — M5412 Radiculopathy, cervical region: Secondary | ICD-10-CM

## 2021-02-18 DIAGNOSIS — M542 Cervicalgia: Secondary | ICD-10-CM

## 2021-02-18 DIAGNOSIS — M5416 Radiculopathy, lumbar region: Secondary | ICD-10-CM

## 2021-02-18 DIAGNOSIS — G8929 Other chronic pain: Secondary | ICD-10-CM

## 2021-02-18 DIAGNOSIS — M545 Low back pain, unspecified: Secondary | ICD-10-CM | POA: Diagnosis not present
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
## 2021-02-20 ENCOUNTER — Ambulatory Visit: Payer: Medicare Other

## 2021-02-20 DIAGNOSIS — M545 Low back pain, unspecified: Secondary | ICD-10-CM

## 2021-02-20 DIAGNOSIS — M5416 Radiculopathy, lumbar region: Secondary | ICD-10-CM

## 2021-02-20 DIAGNOSIS — M542 Cervicalgia: Secondary | ICD-10-CM

## 2021-02-20 DIAGNOSIS — M5412 Radiculopathy, cervical region: Secondary | ICD-10-CM
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
## 2021-02-25 ENCOUNTER — Ambulatory Visit: Payer: Medicare Other

## 2021-02-25 DIAGNOSIS — M545 Low back pain, unspecified: Secondary | ICD-10-CM | POA: Diagnosis not present

## 2021-02-25 DIAGNOSIS — M542 Cervicalgia: Secondary | ICD-10-CM

## 2021-02-25 DIAGNOSIS — M5412 Radiculopathy, cervical region: Secondary | ICD-10-CM

## 2021-02-25 DIAGNOSIS — G8929 Other chronic pain: Secondary | ICD-10-CM

## 2021-02-25 DIAGNOSIS — M5416 Radiculopathy, lumbar region: Secondary | ICD-10-CM
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
## 2021-03-04 ENCOUNTER — Ambulatory Visit: Payer: Medicare Other

## 2021-03-04 ENCOUNTER — Other Ambulatory Visit: Payer: Self-pay

## 2021-03-04 DIAGNOSIS — G8929 Other chronic pain: Secondary | ICD-10-CM

## 2021-03-04 DIAGNOSIS — M545 Low back pain, unspecified: Secondary | ICD-10-CM | POA: Diagnosis not present

## 2021-03-04 DIAGNOSIS — M5416 Radiculopathy, lumbar region: Secondary | ICD-10-CM
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
## 2021-03-06 ENCOUNTER — Ambulatory Visit: Payer: Medicare Other | Attending: Family Medicine

## 2021-03-06 DIAGNOSIS — G8929 Other chronic pain: Secondary | ICD-10-CM | POA: Insufficient documentation

## 2021-03-06 DIAGNOSIS — M5412 Radiculopathy, cervical region: Secondary | ICD-10-CM | POA: Diagnosis present

## 2021-03-06 DIAGNOSIS — M5416 Radiculopathy, lumbar region: Secondary | ICD-10-CM | POA: Diagnosis present

## 2021-03-06 DIAGNOSIS — M545 Low back pain, unspecified: Secondary | ICD-10-CM | POA: Insufficient documentation

## 2021-03-06 DIAGNOSIS — M542 Cervicalgia: Secondary | ICD-10-CM | POA: Diagnosis present
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
## 2021-03-06 NOTE — Patient Instructions (Signed)
Sitting with lumbar towel roll 2 x 2 minutes   Decreased back pain. Reviewed and given as part of her HEP. Pt demonstrated and verbalized understanding. Handout provided.

## 2021-03-11 ENCOUNTER — Ambulatory Visit: Payer: Medicare Other

## 2021-03-11 DIAGNOSIS — M545 Low back pain, unspecified: Secondary | ICD-10-CM | POA: Diagnosis not present

## 2021-03-11 DIAGNOSIS — G8929 Other chronic pain: Secondary | ICD-10-CM

## 2021-03-11 DIAGNOSIS — M5416 Radiculopathy, lumbar region: Secondary | ICD-10-CM
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
## 2021-03-13 ENCOUNTER — Ambulatory Visit: Payer: Medicare Other

## 2021-03-17 ENCOUNTER — Ambulatory Visit: Payer: Medicare Other

## 2021-03-17 DIAGNOSIS — M5412 Radiculopathy, cervical region: Secondary | ICD-10-CM

## 2021-03-17 DIAGNOSIS — M545 Low back pain, unspecified: Secondary | ICD-10-CM | POA: Diagnosis not present

## 2021-03-17 DIAGNOSIS — M5416 Radiculopathy, lumbar region: Secondary | ICD-10-CM

## 2021-03-17 DIAGNOSIS — G8929 Other chronic pain: Secondary | ICD-10-CM

## 2021-03-17 DIAGNOSIS — M542 Cervicalgia: Secondary | ICD-10-CM

## 2021-03-17 NOTE — Patient Instructions (Signed)
Access Code: [email or web address] URL: [email or web address] Date: 03/17/2021 Prepared by: Loralyn Freshwater  Exercises Supine Head Nod Deep Neck Flexor Training - 3 x daily - 7 x weekly - 3 sets - 10 reps Supine Scapular Retraction - 3 x daily - 7 x weekly - 3 sets - 10 reps Supine March - 1 x daily - 7 x weekly - 3 sets - 10 reps Supine Posterior Pelvic Tilt - 2 x daily - 7 x weekly - 3 sets - 10 reps - 5 seconds hold Supine Lower Trunk Rotation - 1 x daily - 7 x weekly - 3 sets - 10 reps Seated Trunk Rotation - Arms Crossed - 1 x daily - 7 x weekly - 3 sets - 10 reps
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
## 2021-03-19 ENCOUNTER — Other Ambulatory Visit: Payer: Self-pay | Admitting: Orthopedic Surgery

## 2021-03-19 DIAGNOSIS — M47816 Spondylosis without myelopathy or radiculopathy, lumbar region: Secondary | ICD-10-CM

## 2021-03-19 DIAGNOSIS — S39012D Strain of muscle, fascia and tendon of lower back, subsequent encounter: Secondary | ICD-10-CM

## 2021-03-19 DIAGNOSIS — Z981 Arthrodesis status: Secondary | ICD-10-CM

## 2021-03-20 ENCOUNTER — Ambulatory Visit: Payer: Medicare Other

## 2021-03-20 ENCOUNTER — Other Ambulatory Visit: Payer: Self-pay | Admitting: Orthopedic Surgery

## 2021-03-20 DIAGNOSIS — M5416 Radiculopathy, lumbar region: Secondary | ICD-10-CM

## 2021-03-20 DIAGNOSIS — M5412 Radiculopathy, cervical region: Secondary | ICD-10-CM

## 2021-03-20 DIAGNOSIS — M545 Low back pain, unspecified: Secondary | ICD-10-CM

## 2021-03-20 DIAGNOSIS — M542 Cervicalgia: Secondary | ICD-10-CM

## 2021-03-20 DIAGNOSIS — G8929 Other chronic pain: Secondary | ICD-10-CM
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
## 2021-06-17 ENCOUNTER — Other Ambulatory Visit: Payer: Self-pay

## 2021-06-17 ENCOUNTER — Ambulatory Visit: Payer: Medicare Other | Attending: Family Medicine

## 2021-06-17 DIAGNOSIS — G8929 Other chronic pain: Secondary | ICD-10-CM | POA: Diagnosis present

## 2021-06-17 DIAGNOSIS — M545 Low back pain, unspecified: Secondary | ICD-10-CM | POA: Diagnosis present

## 2021-06-17 DIAGNOSIS — M6281 Muscle weakness (generalized): Secondary | ICD-10-CM | POA: Diagnosis present

## 2021-06-17 DIAGNOSIS — M5416 Radiculopathy, lumbar region: Secondary | ICD-10-CM | POA: Diagnosis present

## 2021-06-17 NOTE — Therapy (Signed)
[place] ?[facility] ?[address] ?[place], [place], [address] ?Phone: [phone]   Fax:  [phone] ? ?Physical Therapy Evaluation ? ?Patient Details  ?Name: Kristin Mcdowell ?MRN: [id number] ?Date of Birth: [birth year] ?Referring Provider (PT): Burman Freestone, NP ? ? ?Encounter Date: 06/17/2021 ? ? PT End of Session - 06/17/21 1547   ? ? Visit Number 1   ? Number of Visits 17   ? Date for PT Re-Evaluation 08/14/21   ? Authorization Type 1   ? Authorization Time Period of 10 progress report   ? PT Start Time 1548   ? PT Stop Time 1637   ? PT Time Calculation (min) 49 min   ? Activity Tolerance Patient tolerated treatment well   ? Behavior During Therapy [facility] for tasks assessed/performed   ? ?  ?  ? ?  ? ? ?Past Medical History:  ?Diagnosis Date  ? Depression   ? GERD (gastroesophageal reflux disease)   ? Hypertension   ? Seasonal allergies   ? ? ?Past Surgical History:  ?Procedure Laterality Date  ? ABDOMINAL HYSTERECTOMY    ? COLONOSCOPY WITH PROPOFOL N/A 08/30/2015  ? Procedure: COLONOSCOPY WITH PROPOFOL;  Surgeon: Midge Minium, MD;  Location: [facility];  Service: Endoscopy;  Laterality: N/A;  ? COLONOSCOPY WITH PROPOFOL N/A 06/14/2020  ? Procedure: COLONOSCOPY WITH PROPOFOL;  Surgeon: Wyline Mood, MD;  Location: [facility] ENDOSCOPY;  Service: Gastroenterology;  Laterality: N/A;  ? COLONOSCOPY WITH PROPOFOL N/A 07/22/2020  ? Procedure: COLONOSCOPY WITH PROPOFOL;  Surgeon: Wyline Mood, MD;  Location: [facility] ENDOSCOPY;  Service: Gastroenterology;  Laterality: N/A;  ? ESOPHAGOGASTRODUODENOSCOPY (EGD) WITH PROPOFOL N/A 07/22/2020  ? Procedure: ESOPHAGOGASTRODUODENOSCOPY (EGD) WITH PROPOFOL;  Surgeon: Wyline Mood, MD;  Location: [facility] ENDOSCOPY;  Service: Gastroenterology;  Laterality: N/A;  ? LUMBAR DISC SURGERY    ? ? ?There were no vitals filed for this visit. ? ? ? Subjective Assessment - 06/17/21 1549   ? ? Subjective Low back: 6-7/10 (whining), 9/10 at worst  for the past 2 months. Some days back hurts, other days its whining. Not throbbing like it used to. Pain predominantly at L lumbar paraspinal muscle area.   ? Pertinent History Back pain. Was doing good after last session in December 2022 until her last MD appointment. Got a steriod/cortisone shot in her back 05/16/2021 which might have helped. No B LE paresthesia. No loss of bowel or bladder control.  Back is not throbbing like it used to. Joined a gym recently. Pain has gotten a little better since December 2022.   ? Patient Stated Goals Make her back feel better.   ? Currently in Pain? Yes   ? Pain Score 7    ? Pain Location Back   ? Pain Orientation Left;Posterior;Lower   ? Pain Type Chronic pain   ? Pain Onset More than a month ago   ? Pain Frequency Constant   ? Aggravating Factors  Sitting down for 30 minutes, standing (unable to stand more than 45 minutes), lifting, picking up groceries, case of water   ? Pain Relieving Factors Red alcohol that the atheletes use   ? ?  ?  ? ?  ? ? ? ? ? [facility] PT Assessment - 06/17/21 1546   ? ?  ? Assessment  ? Medical Diagnosis Lumbar DDD, HNP, radiculitis, stenosis with neurogenic claudication, back muscle spasm   ? Referring Provider (PT) Burman Freestone, NP   ? Onset Date/Surgical Date 06/02/21  Date PT referral signed. Chronic condition.  ? Prior Therapy yes   ?  ? Precautions  ? Precaution Comments No known precautions   ?  ? Restrictions  ? Other Position/Activity Restrictions No known restrictions   ?  ? Observation/Other Assessments  ? Focus on Therapeutic Outcomes (FOTO)  Lumbar FOTO 54   ?  ? Posture/Postural Control  ? Posture Comments forward neck, L shoulder higher, B scapular protraction, increased lordosis, R greater trochanter slightly higher, L lateral shift around T12/L1 area. Movement preference around T12/L1 area and L4/5 area   ?  ? AROM  ? Lumbar Flexion WFL with back pain   ? Lumbar Extension WFL   ? Lumbar - Right Side [facility], no pain with  overpressure   ? Lumbar - Left Side [facility] with L low back pain around L4/5 area   ? Lumbar - Right Rotation Full, no pain with over pressure   ? Lumbar - Left Rotation WFL with L low back symptoms around L4/5 area   ?  ? Strength  ? Right Hip Flexion 4+/5   ? Right Hip Extension 4/5   Seated manually resisted  ? Right Hip ABduction 4/5   ? Left Hip Flexion 4/5   ? Left Hip Extension 4/5   Seated manually resisted  ? Left Hip ABduction 4-/5   with L lateral trunk pain  ? Right Knee Flexion 5/5   ? Right Knee Extension 5/5   ? Left Knee Flexion 4+/5   ? Left Knee Extension 5/5   ?  ? Special Tests  ? Other special tests Long sit test suggets anterior nutation of R innominate.   ?  ? Ambulation/Gait  ? Gait Comments Decreased trunk rotation   ? ?  ?  ? ?  ? ? ? ? ? ? ? ? ? ? ? ? ? ?Objective measurements completed on examination: See above findings.  ? ?Medbridge Access Code [email or web address] ? ? ?Therapeutic exercise ? ?Hooklying hip extension isometrics, leg straight  ? R 10x5 seconds for 2 sets ? ?supine B scapular retraction 10x5 seconds  ? ?Hooklying transversus abdominis contraction 10x5 seconds  ? ?Standing trunk extension 2x5 seconds. Decreased low back ache.  ? ? ?Improved exercise technique, movement at target joints, use of target muscles after mod verbal, visual, tactile cues.  ? ? ? ? ?Response to treatment ?Fair tolerance to today's session. Decreased ache with trunk extension suggesting extension preference.  ? ? ?Clinical impression ?Pt is a 61 year old female who came to physical therapy secondary to chronic low back pain. She also presents with altered posture, movement preference to around L4/L5 area, decreased trunk strength, directional preference to lumbar extension, bilateral glute med and max weakness, reproduction of symptoms with lumbar flexion, L side bending and L rotation and difficulty performing tasks which involve lifting, as well as prolonged sitting and standing due to back pain. Pt will  benefit from skilled physical therapy services to address the aforementioned deficits.  ? ? ? ? ? ? ? ? ? ? ? ? ? ? ? ? ? ? ? PT Education - 06/17/21 1657   ? ? Education Details ther-ex, HEP, POC   ? Person(s) Educated Patient   ? Methods Explanation;Demonstration;Tactile cues;Verbal cues;Handout   ? Comprehension Returned demonstration;Verbalized understanding   ? ?  ?  ? ?  ? ? ? PT Short Term Goals - 06/17/21 1702   ? ?  ? PT SHORT TERM GOAL #1  ?  Title Pt will be independent with her initial HEP to decrease pain, improve strength and function.   ? Baseline Pt started her HEP (06/17/2021)   ? Time 3   ? Period Weeks   ? Status New   ? Target Date 07/10/21   ? ?  ?  ? ?  ? ? ? ? PT Long Term Goals - 06/17/21 1703   ? ?  ? PT LONG TERM GOAL #1  ? Title Pt will have a decrease in low back pain to 4/10 or less at worst to promote ability to perform tasks which involve lifting as well as be able to tolerate prolonged sitting and standing.   ? Baseline 9/10 low back pain at worst for the past 2 months (06/17/2021)   ? Time 8   ? Period Weeks   ? Status New   ? Target Date 08/14/21   ?  ? PT LONG TERM GOAL #2  ? Title Pt will improve her lumbar spine FOTO by at least 10 points as a demonstration of improved function.   ? Baseline Lumbar spine FOTO 54 (3/114/2023)   ? Time 8   ? Period Weeks   ? Status New   ? Target Date 08/14/21   ?  ? PT LONG TERM GOAL #3  ? Title Pt will improve bilateral hip extension and abduction strength by at least 1/2 MMT grade to promote ability to perform standing tasks with less back pain.   ? Baseline Hip extension 4/5 R, and L, hip abduction 4/5 R, 4-/5 L (06/17/2021)   ? Time 8   ? Period Weeks   ? Status New   ? Target Date 08/14/21   ? ?  ?  ? ?  ? ? ? ? ? ? ? ? ? Plan - 06/17/21 1659   ? ? Clinical Impression Statement Pt is a 61 year old female who came to physical therapy secondary to chronic low back pain. She also presents with altered posture, movement preference to around L4/L5  area, decreased trunk strength, directional preference to lumbar extension, bilateral glute med and max weakness, reproduction of symptoms with lumbar flexion, L side bending and L rotation and difficulty performing ta

## 2021-06-17 NOTE — Patient Instructions (Signed)
Access Code: [email or web address] ?URL: [email or web address] ?Date: 06/17/2021 ?Prepared by: Joneen Boers ? ?Exercises ?Supine Scapular Retraction - 1 x daily - 7 x weekly - 3 sets - 10 reps - 5 seconds hold ?Supine Transversus Abdominis Bracing - Hands on Stomach - 1 x daily - 7 x weekly - 3 sets - 10 reps - 5 seconds hold ? ?

## 2021-06-19 ENCOUNTER — Other Ambulatory Visit: Payer: Self-pay

## 2021-06-19 ENCOUNTER — Ambulatory Visit: Payer: Medicare Other

## 2021-06-19 DIAGNOSIS — M5416 Radiculopathy, lumbar region: Secondary | ICD-10-CM

## 2021-06-19 DIAGNOSIS — M6281 Muscle weakness (generalized): Secondary | ICD-10-CM

## 2021-06-19 DIAGNOSIS — M545 Low back pain, unspecified: Secondary | ICD-10-CM | POA: Diagnosis not present

## 2021-06-19 NOTE — Therapy (Signed)
[place] ?[facility] ?[address] ?[place], [place], [address] ?Phone: [phone]   Fax:  [phone] ? ?Physical Therapy Treatment ? ?Patient Details  ?Name: Kristin Mcdowell ?MRN: [id number] ?Date of Birth: [birth year] ?Referring Provider (PT): Burman FreestoneWhitney Meeler, NP ? ? ?Encounter Date: 06/19/2021 ? ? PT End of Session - 06/19/21 1548   ? ? Visit Number 2   ? Number of Visits 17   ? Date for PT Re-Evaluation 08/14/21   ? Authorization Type 2   ? Authorization Time Period of 10 progress report   ? PT Start Time 1548   ? PT Stop Time 1629   ? PT Time Calculation (min) 41 min   ? Activity Tolerance Patient tolerated treatment well   ? Behavior [facility] for tasks assessed/performed   ? ?  ?  ? ?  ? ? ?Past Medical History:  ?Diagnosis Date  ? Depression   ? GERD (gastroesophageal reflux disease)   ? Hypertension   ? Seasonal allergies   ? ? ?Past Surgical History:  ?Procedure Laterality Date  ? ABDOMINAL HYSTERECTOMY    ? COLONOSCOPY WITH PROPOFOL N/A 08/30/2015  ? Procedure: COLONOSCOPY WITH PROPOFOL;  Surgeon: Midge Miniumarren Wohl, MD;  [facility];  Service: Endoscopy;  Laterality: N/A;  ? COLONOSCOPY WITH PROPOFOL N/A 06/14/2020  ? Procedure: COLONOSCOPY WITH PROPOFOL;  Surgeon: Wyline MoodAnna, Kiran, MD;  [facility];  Service: Gastroenterology;  Laterality: N/A;  ? COLONOSCOPY WITH PROPOFOL N/A 07/22/2020  ? Procedure: COLONOSCOPY WITH PROPOFOL;  Surgeon: Wyline MoodAnna, Kiran, MD;  [facility];  Service: Gastroenterology;  Laterality: N/A;  ? ESOPHAGOGASTRODUODENOSCOPY (EGD) WITH PROPOFOL N/A 07/22/2020  ? Procedure: ESOPHAGOGASTRODUODENOSCOPY (EGD) WITH PROPOFOL;  Surgeon: Wyline MoodAnna, Kiran, MD;  [facility];  Service: Gastroenterology;  Laterality: N/A;  ? LUMBAR DISC SURGERY    ? ? ?There were no vitals filed for this visit. ? ? Subjective Assessment - 06/19/21 1551   ? ? Subjective Back is not bad today. 5/10 currently.   ?  Pertinent History Back pain. Was doing good after last session in December 2022 until her last MD appointment. Got a steriod/cortisone shot in her back 05/16/2021 which might have helped. No B LE paresthesia. No loss of bowel or bladder control.  Back is not throbbing like it used to. Joined a gym recently. Pain has gotten a little better since December 2022.   ? Patient Stated Goals Make her back feel better.   ? Currently in Pain? Yes   ? Pain Score 5    ? Pain Onset More than a month ago   ? ?  ?  ? ?  ? ? ? ? ? ? ? ? ? ? ? ? ? ? ? ? ? ? ? ? ? ? ? ? ? ? ? ? ? PT Education - 06/19/21 1555   ? ? Education Details ther-ex   ? Person(s) Educated Patient   ? Methods Explanation;Demonstration;Tactile cues;Verbal cues   ? Comprehension Returned demonstration;Verbalized understanding   ? ?  ?  ? ?  ? ?  ?Objective  ?  ?Medbridge Access Code [email or web address] ?  ?  ?Therapeutic exercise ?  ?Standing gentle back extension 10x5 seconds for 2 sets ? ?Standing L lateral shift correction 10x5 seconds ? ?Reclined with moist heat to low back ? Transversus abdominis contraction for 2 sets ? ? Hooklying posterior pelvic tilts 10x2 with 5 second holds  ?  ? Hooklying lower trunk  rotation 10x each side to promote mobility ? ? Transversus abdominis contraction with marches 10x2 each LE ? ? ?  ?Improved exercise technique, movement at target joints, use of target muscles after mod verbal, visual, tactile cues.  ?  ? ?Manual therapy  ?Seated STM to lumbar paraspinal muscle R > L to decrease muscle tension and fascial restrictions  ? ?  ?  ?  ?Response to treatment ?Decreased low back stiffness after session. Back feels good per pt.  ? ?  ?  ?Clinical impression ?Worked on posture, trunk strengthening and decreasing lumbar paraspinal muscle tension to decrease stress to low back. Improved comfort level and decreased stiffness reported after session. Pt will benefit from continued skilled physical therapy services to decrease pain, improve  strength and function.  ? ? ? ? ? ? PT Short Term Goals - 06/17/21 1702   ? ?  ? PT SHORT TERM GOAL #1  ? Title Pt will be independent with her initial HEP to decrease pain, improve strength and function.   ? Baseline Pt started her HEP (06/17/2021)   ? Time 3   ? Period Weeks   ? Status New   ? Target Date 07/10/21   ? ?  ?  ? ?  ? ? ? ? PT Long Term Goals - 06/17/21 1703   ? ?  ? PT LONG TERM GOAL #1  ? Title Pt will have a decrease in low back pain to 4/10 or less at worst to promote ability to perform tasks which involve lifting as well as be able to tolerate prolonged sitting and standing.   ? Baseline 9/10 low back pain at worst for the past 2 months (06/17/2021)   ? Time 8   ? Period Weeks   ? Status New   ? Target Date 08/14/21   ?  ? PT LONG TERM GOAL #2  ? Title Pt will improve her lumbar spine FOTO by at least 10 points as a demonstration of improved function.   ? Baseline Lumbar spine FOTO 54 (3/114/2023)   ? Time 8   ? Period Weeks   ? Status New   ? Target Date 08/14/21   ?  ? PT LONG TERM GOAL #3  ? Title Pt will improve bilateral hip extension and abduction strength by at least 1/2 MMT grade to promote ability to perform standing tasks with less back pain.   ? Baseline Hip extension 4/5 R, and L, hip abduction 4/5 R, 4-/5 L (06/17/2021)   ? Time 8   ? Period Weeks   ? Status New   ? Target Date 08/14/21   ? ?  ?  ? ?  ? ? ? ? ? ? ? ? Plan - 06/19/21 1556   ? ? Clinical Impression Statement Worked on posture, trunk strengthening and decreasing lumbar paraspinal muscle tension to decrease stress to low back. Improved comfort level and decreased stiffness reported after session. Pt will benefit from continued skilled physical therapy services to decrease pain, improve strength and function.   ? Personal Factors and Comorbidities Comorbidity 3+;Past/Current Experience;Time since onset of injury/illness/exacerbation   ? Comorbidities Depression, HTN, lumbar surgery   ? Examination-Activity Limitations  Lift;Stand;Bend;Sit   ? Stability/Clinical Decision Making Stable/Uncomplicated   ? Rehab Potential Fair   ? PT Frequency 2x / week   ? PT Duration 8 weeks   ? PT Treatment/Interventions Therapeutic activities;Therapeutic exercise;Neuromuscular re-education;Manual techniques;Dry needling;Aquatic Therapy;Electrical Stimulation;Iontophoresis 4mg /ml Dexamethasone;Traction;Moist Heat;Functional mobility training;Patient/family education   ?  PT Next Visit Plan posture, scapular, anterior cervical, trunk, hip strengthening, manual techniques, modalities PRN   ? PT Home Exercise Plan Medbridge Access Code [email or web address]   ? Consulted and Agree with Plan of Care Patient   ? ?  ?  ? ?  ? ? ?Patient will benefit from skilled therapeutic intervention in order to improve the following deficits and impairments:  Pain, Improper body mechanics, Postural dysfunction, Decreased strength ? ?Visit Diagnosis: ?Chronic bilateral low back pain, unspecified whether sciatica present ? ?Muscle weakness (generalized) ? ?Radiculopathy, lumbar region ? ? ? ? ?Problem List ?Patient Active Problem List  ? Diagnosis Date Noted  ? Dysphagia 07/10/2020  ? Colon cancer screening   ? Gastro-esophageal reflux disease without esophagitis 01/25/2015  ? Other fatigue 01/25/2015  ? Allergic rhinitis 01/31/2014  ? Essential (primary) hypertension 01/31/2014  ? ? ?Loralyn Freshwater PT, DPT ? ?06/19/2021, 5:33 PM ? ?[place] ?[facility] ?[address] ?[place], [place], [address] ?Phone: [phone]   Fax:  [phone] ? ?Name: DEITRA CRAINE ?MRN: [id number] ?Date of Birth: 07-Apr-1960 ? ? ? ?

## 2021-06-24 ENCOUNTER — Ambulatory Visit: Payer: Medicare Other

## 2021-06-26 ENCOUNTER — Other Ambulatory Visit: Payer: Self-pay

## 2021-06-26 ENCOUNTER — Ambulatory Visit: Payer: Medicare Other

## 2021-06-26 DIAGNOSIS — M545 Low back pain, unspecified: Secondary | ICD-10-CM | POA: Diagnosis not present

## 2021-06-26 DIAGNOSIS — M5416 Radiculopathy, lumbar region: Secondary | ICD-10-CM

## 2021-06-26 DIAGNOSIS — G8929 Other chronic pain: Secondary | ICD-10-CM

## 2021-06-26 DIAGNOSIS — M6281 Muscle weakness (generalized): Secondary | ICD-10-CM

## 2021-06-26 NOTE — Therapy (Signed)
[place] ?[facility] ?[address] ?[place], [place], [address] ?Phone: [phone]   Fax:  [phone] ? ?Physical Therapy Treatment ? ?Patient Details  ?Name: Kristin Mcdowell ?MRN: [id number] ?Date of Birth: May 11, 1960 ?Referring Provider (PT): Burman Freestone, NP ? ? ?Encounter Date: 06/26/2021 ? ? PT End of Session - 06/26/21 1511   ? ? Visit Number 3   ? Number of Visits 17   ? Date for PT Re-Evaluation 08/14/21   ? Authorization Type 3   ? Authorization Time Period of 10 progress report   ? PT Start Time 1505   ? PT Stop Time 1547   ? PT Time Calculation (min) 42 min   ? Activity Tolerance Patient tolerated treatment well   ? Behavior During Therapy [facility] for tasks assessed/performed   ? ?  ?  ? ?  ? ? ?Past Medical History:  ?Diagnosis Date  ? Depression   ? GERD (gastroesophageal reflux disease)   ? Hypertension   ? Seasonal allergies   ? ? ?Past Surgical History:  ?Procedure Laterality Date  ? ABDOMINAL HYSTERECTOMY    ? COLONOSCOPY WITH PROPOFOL N/A 08/30/2015  ? Procedure: COLONOSCOPY WITH PROPOFOL;  Surgeon: Midge Minium, MD;  Location[facility];  Service: Endoscopy;  Laterality: N/A;  ? COLONOSCOPY WITH PROPOFOL N/A 06/14/2020  ? Procedure: COLONOSCOPY WITH PROPOFOL;  Surgeon: Wyline Mood, MD;  Location: [facility];  Service: Gastroenterology;  Laterality: N/A;  ? COLONOSCOPY WITH PROPOFOL N/A 07/22/2020  ? Procedure: COLONOSCOPY WITH PROPOFOL;  Surgeon: Wyline Mood, MD;  Location: [facility] ENDOSCOPY;  Service: Gastroenterology;  Laterality: N/A;  ? ESOPHAGOGASTRODUODENOSCOPY (EGD) WITH PROPOFOL N/A 07/22/2020  ? Procedure: ESOPHAGOGASTRODUODENOSCOPY (EGD) WITH PROPOFOL;  Surgeon: Wyline Mood, MD;  Location: [facility] ENDOSCOPY;  Service: Gastroenterology;  Laterality: N/A;  ? LUMBAR DISC SURGERY    ? ? ?There were no vitals filed for this visit. ? ? Subjective Assessment - 06/26/21 1507   ? ? Subjective LBP reports currently a 5/10 NPS. No other  concerns.   ? Pertinent History Back pain. Was doing good after last session in December 2022 until her last MD appointment. Got a steriod/cortisone shot in her back 05/16/2021 which might have helped. No B LE paresthesia. No loss of bowel or bladder control.  Back is not throbbing like it used to. Joined a gym recently. Pain has gotten a little better since December 2022.   ? Patient Stated Goals Make her back feel better.   ? Currently in Pain? Yes   ? Pain Score 5    ? Pain Onset More than a month ago   ? ?  ?  ? ?  ? ?There.ex:  ? Nu-Step L4 for 2 min. L2 for 3 min to warm up LE's ? Standing gentle back extension 10x5 seconds for 2 sets ?Standing L lateral shift correction 2x10, min VC's for form/technique.  ?Transversus abdominis contraction for 2 sets, 10 reps  ?Transversus abdominis contraction with marches 10x2 each LE ?Seated forward lumbar flexion on clear physioball: 2x10. Progressed to R/L lateral deviations x10/side. VC's for keeping in pain free ranges. ?Seated clamshell: blue TB, 2x10 for hip strengthening. Min VC's for eccentric control. ? ?  ? PT Education - 06/26/21 1511   ? ? Education Details form/technique with exercise   ? Person(s) Educated Patient   ? Methods Explanation;Demonstration;Tactile cues;Verbal cues   ? Comprehension Verbalized understanding;Returned demonstration   ? ?  ?  ? ?  ? ? ?  PT Short Term Goals - 06/17/21 1702   ? ?  ? PT SHORT TERM GOAL #1  ? Title Pt will be independent with her initial HEP to decrease pain, improve strength and function.   ? Baseline Pt started her HEP (06/17/2021)   ? Time 3   ? Period Weeks   ? Status New   ? Target Date 07/10/21   ? ?  ?  ? ?  ? ? ? ? PT Long Term Goals - 06/17/21 1703   ? ?  ? PT LONG TERM GOAL #1  ? Title Pt will have a decrease in low back pain to 4/10 or less at worst to promote ability to perform tasks which involve lifting as well as be able to tolerate prolonged sitting and standing.   ? Baseline 9/10 low back pain at worst for  the past 2 months (06/17/2021)   ? Time 8   ? Period Weeks   ? Status New   ? Target Date 08/14/21   ?  ? PT LONG TERM GOAL #2  ? Title Pt will improve her lumbar spine FOTO by at least 10 points as a demonstration of improved function.   ? Baseline Lumbar spine FOTO 54 (3/114/2023)   ? Time 8   ? Period Weeks   ? Status New   ? Target Date 08/14/21   ?  ? PT LONG TERM GOAL #3  ? Title Pt will improve bilateral hip extension and abduction strength by at least 1/2 MMT grade to promote ability to perform standing tasks with less back pain.   ? Baseline Hip extension 4/5 R, and L, hip abduction 4/5 R, 4-/5 L (06/17/2021)   ? Time 8   ? Period Weeks   ? Status New   ? Target Date 08/14/21   ? ?  ?  ? ?  ? ? ? ? ? ? ? ? Plan - 06/26/21 1527   ? ? Clinical Impression Statement Continuing PT POC with focus on lumbar mobility and core/hip strengthening exercises. Pt has excellent understanding of exercises with only need for min VC's for slowing speed of motion to improve quality of movement. Pt will continue to benefit from skilled PT services to decrease pain and improve strength/function.   ? Personal Factors and Comorbidities Comorbidity 3+;Past/Current Experience;Time since onset of injury/illness/exacerbation   ? Comorbidities Depression, HTN, lumbar surgery   ? Examination-Activity Limitations Lift;Stand;Bend;Sit   ? Stability/Clinical Decision Making Stable/Uncomplicated   ? Rehab Potential Fair   ? PT Frequency 2x / week   ? PT Duration 8 weeks   ? PT Treatment/Interventions Therapeutic activities;Therapeutic exercise;Neuromuscular re-education;Manual techniques;Dry needling;Aquatic Therapy;Electrical Stimulation;Iontophoresis 4mg /ml Dexamethasone;Traction;Moist Heat;Functional mobility training;Patient/family education   ? PT Next Visit Plan posture, scapular, anterior cervical, trunk, hip strengthening, manual techniques, modalities PRN   ? PT Home Exercise Plan Medbridge Access Code [email or web address]   ? Consulted and  Agree with Plan of Care Patient   ? ?  ?  ? ?  ? ? ?Patient will benefit from skilled therapeutic intervention in order to improve the following deficits and impairments:  Pain, Improper body mechanics, Postural dysfunction, Decreased strength ? ?Visit Diagnosis: ?Chronic bilateral low back pain, unspecified whether sciatica present ? ?Muscle weakness (generalized) ? ?Radiculopathy, lumbar region ? ? ? ? ?Problem List ?Patient Active Problem List  ? Diagnosis Date Noted  ? Dysphagia 07/10/2020  ? Colon cancer screening   ? Gastro-esophageal reflux disease without esophagitis 01/25/2015  ? Other fatigue  01/25/2015  ? Allergic rhinitis 01/31/2014  ? Essential (primary) hypertension 01/31/2014  ? ? ?Delphia Grates. Fairly IV, PT, DPT ?Physical Therapist- [place]  ?[facility]  ?06/26/2021, 3:50 PM ? ?[place] ?[facility] ?[address] ?[place], [place], [address] ?Phone: [phone]   Fax:  [phone] ? ?Name: Kristin Mcdowell ?MRN: [id number] ?Date of Birth: May 23, 1960 ? ? ? ?

## 2021-07-01 ENCOUNTER — Other Ambulatory Visit: Payer: Self-pay

## 2021-07-01 ENCOUNTER — Ambulatory Visit: Payer: Medicare Other

## 2021-07-01 DIAGNOSIS — M6281 Muscle weakness (generalized): Secondary | ICD-10-CM

## 2021-07-01 DIAGNOSIS — M545 Low back pain, unspecified: Secondary | ICD-10-CM

## 2021-07-01 DIAGNOSIS — M5416 Radiculopathy, lumbar region: Secondary | ICD-10-CM

## 2021-07-01 NOTE — Therapy (Signed)
?OUTPATIENT PHYSICAL THERAPY TREATMENT NOTE ? ? ?Patient Name: Kristin Mcdowell ?MRN: [id number] ?DOB:28-Jan-1961, 61 y.o., female ?Today's Date: 07/01/2021 ? ?PCP: Leanna Sato, MD ?REFERRING PROVIDER: Meeler, Jodelle Gross, FNP ? ? PT End of Session - 07/01/21 1506   ? ? Visit Number 4   ? Number of Visits 17   ? Date for PT Re-Evaluation 08/14/21   ? Authorization Type 4   ? Authorization Time Period of 10 progress report   ? PT Start Time 1506   ? PT Stop Time 1547   ? PT Time Calculation (min) 41 min   ? Activity Tolerance Patient tolerated treatment well   ? Behavior During Therapy [facility] for tasks assessed/performed   ? ?  ?  ? ?  ? ? ?Past Medical History:  ?Diagnosis Date  ? Depression   ? GERD (gastroesophageal reflux disease)   ? Hypertension   ? Seasonal allergies   ? ?Past Surgical History:  ?Procedure Laterality Date  ? ABDOMINAL HYSTERECTOMY    ? COLONOSCOPY WITH PROPOFOL N/A 08/30/2015  ? Procedure: COLONOSCOPY WITH PROPOFOL;  Surgeon: Midge Minium, MD;  [facility];  Service: Endoscopy;  Laterality: N/A;  ? COLONOSCOPY WITH PROPOFOL N/A 06/14/2020  ? Procedure: COLONOSCOPY WITH PROPOFOL;  Surgeon: Wyline Mood, MD;  Location: [facility];  Service: Gastroenterology;  Laterality: N/A;  ? COLONOSCOPY WITH PROPOFOL N/A 07/22/2020  ? Procedure: COLONOSCOPY WITH PROPOFOL;  Surgeon: Wyline Mood, MD;  Location: [facility];  Service: Gastroenterology;  Laterality: N/A;  ? ESOPHAGOGASTRODUODENOSCOPY (EGD) WITH PROPOFOL N/A 07/22/2020  ? Procedure: ESOPHAGOGASTRODUODENOSCOPY (EGD) WITH PROPOFOL;  Surgeon: Wyline Mood, MD;  [facility];  Service: Gastroenterology;  Laterality: N/A;  ? LUMBAR DISC SURGERY    ? ?Patient Active Problem List  ? Diagnosis Date Noted  ? Dysphagia 07/10/2020  ? Colon cancer screening   ? Gastro-esophageal reflux disease without esophagitis 01/25/2015  ? Other fatigue 01/25/2015  ? Allergic rhinitis 01/31/2014  ? Essential (primary) hypertension 01/31/2014   ? ? ?REFERRING DIAG: Lumbar DDD, HNP, radiculitis, stenosis with neurogenic claudication, back muscle spasm ? ?THERAPY DIAG:  ?Chronic bilateral low back pain, unspecified whether sciatica present ? ?Muscle weakness (generalized) ? ?Radiculopathy, lumbar region ? ?PERTINENT HISTORY: Back pain. Was doing good after last session in December 2022 until her last MD appointment. Got a steriod/cortisone shot in her back 05/16/2021 which might have helped. No B LE paresthesia. No loss of bowel or bladder control. Back is not throbbing like it used to. Joined a gym recently. Pain has gotten a little better since December 2022. ? ?PRECAUTIONS: No known precautions ? ?SUBJECTIVE: Back is not too bad today, just a little whining, about a 4/10 currently. ? ?PAIN:  ?Are you having pain? Yes: NPRS scale: 4/10 ?Pain location: low back ?Pain description: whining ? ? ? ? ?TODAY'S TREATMENT:  ?Objective  ?  ?Medbridge Access Code [email or web address] ?  ?  ?Therapeutic exercise ? Standing L lateral shift correction 2x10 ? ? ?Standing gentle back extension 10x5 seconds for 2 sets ? Increased low back discomfort ? ? ?Seated forward lumbar flexion on clear physioball: 2x10 with 5 second holds  ? ?Reclined with moist heat to low back ? Transversus abdominis contraction 10x5 seconds for 2 sets ?  ? Hooklying lower trunk rotation 10x each side to promote mobility ? ? Transversus abdominis contraction with marches 10x3 each LE ? ?Hooklying clamshells blue band 10x3 ? ? ? ?  ?Improved exercise technique, movement at target joints, use  of target muscles after mod verbal, visual, tactile cues.  ?  ? ?Manual therapy ?Seated STM to lumbar paraspinal muscle R > L to decrease muscle tension and fascial restrictions  ? ?  ?  ?  ?Response to treatment ?Fair tolerance to today's session ? ? ?  ?  ?Clinical impression ?Decreased low back discomfort with flexion movement today. Continued working onposture, trunk strengthening and decreasing lumbar paraspinal  muscle tension to decrease stress to low back. Fair tolerance to today's session. Pt will benefit from continued skilled physical therapy services to decrease pain, improve strength and function.  ? ? ? ?PATIENT EDUCATION: ?Education details: there-ex ?Person educated: Patient ?Education method: Explanation, Demonstration, Tactile cues, and Verbal cues ?Education comprehension: verbalized understanding and returned demonstration ? ? ?HOME EXERCISE PROGRAM: ?Access Code: [email or web address] ?URL: [email or web address] ?Date: 07/01/2021 ?Prepared by: Loralyn Freshwater ? ?Exercises ?- Supine Scapular Retraction  - 1 x daily - 7 x weekly - 3 sets - 10 reps - 5 seconds hold ?- Supine Transversus Abdominis Bracing - Hands on Stomach  - 1 x daily - 7 x weekly - 3 sets - 10 reps - 5 seconds hold ? ? ? ? ? ? ? ? PT Short Term Goals - 06/17/21 1702   ? ?  ? PT SHORT TERM GOAL #1  ? Title Pt will be independent with her initial HEP to decrease pain, improve strength and function.   ? Baseline Pt started her HEP (06/17/2021)   ? Time 3   ? Period Weeks   ? Status New   ? Target Date 07/10/21   ? ?  ?  ? ?  ? ? ? PT Long Term Goals - 06/17/21 1703   ? ?  ? PT LONG TERM GOAL #1  ? Title Pt will have a decrease in low back pain to 4/10 or less at worst to promote ability to perform tasks which involve lifting as well as be able to tolerate prolonged sitting and standing.   ? Baseline 9/10 low back pain at worst for the past 2 months (06/17/2021)   ? Time 8   ? Period Weeks   ? Status New   ? Target Date 08/14/21   ?  ? PT LONG TERM GOAL #2  ? Title Pt will improve her lumbar spine FOTO by at least 10 points as a demonstration of improved function.   ? Baseline Lumbar spine FOTO 54 (3/114/2023)   ? Time 8   ? Period Weeks   ? Status New   ? Target Date 08/14/21   ?  ? PT LONG TERM GOAL #3  ? Title Pt will improve bilateral hip extension and abduction strength by at least 1/2 MMT grade to promote ability to perform standing tasks with  less back pain.   ? Baseline Hip extension 4/5 R, and L, hip abduction 4/5 R, 4-/5 L (06/17/2021)   ? Time 8   ? Period Weeks   ? Status New   ? Target Date 08/14/21   ? ?  ?  ? ?  ? ? ? Plan - 07/01/21 1834   ? ? Clinical Impression Statement Decreased low back discomfort with flexion movement today. Continued working onposture, trunk strengthening and decreasing lumbar paraspinal muscle tension to decrease stress to low back. Fair tolerance to today's session. Pt will benefit from continued skilled physical therapy services to decrease pain, improve strength and function.   ? Personal Factors and Comorbidities Comorbidity 3+;Past/Current Experience;Time since onset  of injury/illness/exacerbation   ? Comorbidities Depression, HTN, lumbar surgery   ? Examination-Activity Limitations Lift;Stand;Bend;Sit   ? Stability/Clinical Decision Making Stable/Uncomplicated   ? Rehab Potential Fair   ? PT Frequency 2x / week   ? PT Duration 8 weeks   ? PT Treatment/Interventions Therapeutic activities;Therapeutic exercise;Neuromuscular re-education;Manual techniques;Dry needling;Aquatic Therapy;Electrical Stimulation;Iontophoresis 4mg /ml Dexamethasone;Traction;Moist Heat;Functional mobility training;Patient/family education   ? PT Next Visit Plan posture, scapular, anterior cervical, trunk, hip strengthening, manual techniques, modalities PRN   ? PT Home Exercise Plan Medbridge Access Code [email or web address]   ? Consulted and Agree with Plan of Care Patient   ? ?  ?  ? ?  ? ? ? ?Loralyn FreshwaterMiguel Yamili Lichtenwalner PT, DPT ? ?07/01/2021, 6:34 PM ? ?  ? ? ?

## 2021-07-03 ENCOUNTER — Ambulatory Visit: Payer: Medicare Other

## 2021-07-03 DIAGNOSIS — M6281 Muscle weakness (generalized): Secondary | ICD-10-CM

## 2021-07-03 DIAGNOSIS — M5416 Radiculopathy, lumbar region: Secondary | ICD-10-CM

## 2021-07-03 DIAGNOSIS — G8929 Other chronic pain: Secondary | ICD-10-CM

## 2021-07-03 DIAGNOSIS — M545 Low back pain, unspecified: Secondary | ICD-10-CM | POA: Diagnosis not present

## 2021-07-03 NOTE — Therapy (Signed)
?OUTPATIENT PHYSICAL THERAPY TREATMENT NOTE ? ? ?Patient Name: Kristin Mcdowell ?MRN: [id number] ?DOB:1960/10/02, 61 y.o., female ?Today's Date: 07/03/2021 ? ?PCP: Leanna Sato, MD ?REFERRING PROVIDER: Meeler, Jodelle Gross, FNP ? ? PT End of Session - 07/03/21 1549   ? ? Visit Number 5   ? Number of Visits 17   ? Date for PT Re-Evaluation 08/14/21   ? Authorization Type 5   ? Authorization Time Period of 10 progress report   ? PT Start Time 1549   ? PT Stop Time 1629   ? PT Time Calculation (min) 40 min   ? Activity Tolerance Patient tolerated treatment well   ? Behavior During Therapy [facility] for tasks assessed/performed   ? ?  ?  ? ?  ? ? ? ?Past Medical History:  ?Diagnosis Date  ? Depression   ? GERD (gastroesophageal reflux disease)   ? Hypertension   ? Seasonal allergies   ? ?Past Surgical History:  ?Procedure Laterality Date  ? ABDOMINAL HYSTERECTOMY    ? COLONOSCOPY WITH PROPOFOL N/A 08/30/2015  ? Procedure: COLONOSCOPY WITH PROPOFOL;  Surgeon: Midge Minium, MD;  Location: [facility];  Service: Endoscopy;  Laterality: N/A;  ? COLONOSCOPY WITH PROPOFOL N/A 06/14/2020  ? Procedure: COLONOSCOPY WITH PROPOFOL;  Surgeon: Wyline Mood, MD;  Location: [facility] END[facility]COPY;  Service: Gastroenterology;  Laterality: N/A;  ? COLONOSCOPY WITH PROPOFOL N/A 07/22/2020  ? Procedure: COLONOSCOPY WITH PROPOFOL;  Surgeon: Wyline Mood, MD;  Location: [facility] ENDOSCOPY;  Service: Gastroenterology;  Laterality: N/A;  ? ESOPHAGOGASTRODUODENOSCOPY (EGD) WITH PROPOFOL N/A 07/22/2020  ? Procedure: ESOPHAGOGASTRODUODENOSCOPY (EGD) WITH PROPOFOL;  Surgeon: Wyline Mood, MD;  Location: [facility] ENDOSCOPY;  Service: Gastroenterology;  Laterality: N/A;  ? LUMBAR DISC SURGERY    ? ?Patient Active Problem List  ? Diagnosis Date Noted  ? Dysphagia 07/10/2020  ? Colon cancer screening   ? Gastro-esophageal reflux disease without esophagitis 01/25/2015  ? Other fatigue 01/25/2015  ? Allergic rhinitis 01/31/2014  ? Essential (primary) hypertension 01/31/2014   ? ? ?REFERRING DIAG: Lumbar DDD, HNP, radiculitis, stenosis with neurogenic claudication, back muscle spasm ? ?THERAPY DIAG:  ?Chronic bilateral low back pain, unspecified whether sciatica present ? ?Muscle weakness (generalized) ? ?Radiculopathy, lumbar region ? ?PERTINENT HISTORY: Back pain. Was doing good after last session in December 2022 until her last MD appointment. Got a steriod/cortisone shot in her back 05/16/2021 which might have helped. No B LE paresthesia. No loss of bowel or bladder control. Back is not throbbing like it used to. Joined a gym recently. Pain has gotten a little better since December 2022. ? ?PRECAUTIONS: No known precautions ? ?SUBJECTIVE: Just a little bit of pain, probably about a 3/10 currently. Not really hurting.  ? ? ? ? ?PAIN:  ?Are you having pain? Yes: NPRS scale: 3/10 ?Pain location: low back ?Pain description: ache ? ? ? ? ?TODAY'S TREATMENT:  ?Objective  ?  ?Medbridge Access Code [email or web address] ?  ?  ?Therapeutic exercise ?  ?Standing R trunk side bend 10x5 seconds for 2 sets  ? ?Seated trunk flexion stretch with clear/silver physioball ? To the R 10x10 seconds for 2 sets ? ?Standing L trunk side bend 10x5 seconds  ? Decreased L low back pain initially.  ? ?Omega machine  ? Rows plate 10 for 57Q4 ? ? Lat pull down plate 15 for 69G2 ? ? ? Standing B shoulder extension plate 5 with 2 lbs dumbbell for 10x2 to promote trunk strength.  ? ?Seated trunk flexion stretch 5x10  seconds  ? ? ?  ?Improved exercise technique, movement at target joints, use of target muscles after mod verbal, visual, tactile cues.  ?  ? ?  ?  ?Response to treatment ?Fair tolerance to today's session ? ? ?  ?  ?Clinical impression ?Decreased starting low back pain today compared to previous session. Worked on decreasing muscle tension to back as well as improving trunk strength and posture to help decrease stress to low back. Fair tolerance to today's session. Pt will benefit from continued skilled physical  therapy services to decrease pain, improve strength and function.  ? ? ? ?PATIENT EDUCATION: ?Education details: there-ex ?Person educated: Patient ?Education method: Explanation, Demonstration, Tactile cues, and Verbal cues ?Education comprehension: verbalized understanding and returned demonstration ? ? ?HOME EXERCISE PROGRAM: ?Access Code: [email or web address] ?URL: [email or web address] ?Date: 07/01/2021 ?Prepared by: Loralyn FreshwaterMiguel Deshanta Lady ? ?Exercises ?- Supine Scapular Retraction  - 1 x daily - 7 x weekly - 3 sets - 10 reps - 5 seconds hold ?- Supine Transversus Abdominis Bracing - Hands on Stomach  - 1 x daily - 7 x weekly - 3 sets - 10 reps - 5 seconds hold ? ? ? ? ? ? ? ? PT Short Term Goals - 06/17/21 1702   ? ?  ? PT SHORT TERM GOAL #1  ? Title Pt will be independent with her initial HEP to decrease pain, improve strength and function.   ? Baseline Pt started her HEP (06/17/2021)   ? Time 3   ? Period Weeks   ? Status New   ? Target Date 07/10/21   ? ?  ?  ? ?  ? ? ? PT Long Term Goals - 06/17/21 1703   ? ?  ? PT LONG TERM GOAL #1  ? Title Pt will have a decrease in low back pain to 4/10 or less at worst to promote ability to perform tasks which involve lifting as well as be able to tolerate prolonged sitting and standing.   ? Baseline 9/10 low back pain at worst for the past 2 months (06/17/2021)   ? Time 8   ? Period Weeks   ? Status New   ? Target Date 08/14/21   ?  ? PT LONG TERM GOAL #2  ? Title Pt will improve her lumbar spine FOTO by at least 10 points as a demonstration of improved function.   ? Baseline Lumbar spine FOTO 54 (3/114/2023)   ? Time 8   ? Period Weeks   ? Status New   ? Target Date 08/14/21   ?  ? PT LONG TERM GOAL #3  ? Title Pt will improve bilateral hip extension and abduction strength by at least 1/2 MMT grade to promote ability to perform standing tasks with less back pain.   ? Baseline Hip extension 4/5 R, and L, hip abduction 4/5 R, 4-/5 L (06/17/2021)   ? Time 8   ? Period Weeks   ?  Status New   ? Target Date 08/14/21   ? ?  ?  ? ?  ? ? ? Plan - 07/03/21 1555   ? ? Clinical Impression Statement Decreased starting low back pain today compared to previous session. Worked on decreasing muscle tension to back as well as improving trunk strength and posture to help decrease stress to low back. Fair tolerance to today's session. Pt will benefit from continued skilled physical therapy services to decrease pain, improve strength and function.   ? Personal Factors and  Comorbidities Comorbidity 3+;Past/Current Experience;Time since onset of injury/illness/exacerbation   ? Comorbidities Depression, HTN, lumbar surgery   ? Examination-Activity Limitations Lift;Stand;Bend;Sit   ? Stability/Clinical Decision Making Stable/Uncomplicated   ? Clinical Decision Making Low   ? Rehab Potential Fair   ? PT Frequency 2x / week   ? PT Duration 8 weeks   ? PT Treatment/Interventions Therapeutic activities;Therapeutic exercise;Neuromuscular re-education;Manual techniques;Dry needling;Aquatic Therapy;Electrical Stimulation;Iontophoresis 4mg /ml Dexamethasone;Traction;Moist Heat;Functional mobility training;Patient/family education   ? PT Next Visit Plan posture, scapular, anterior cervical, trunk, hip strengthening, manual techniques, modalities PRN   ? PT Home Exercise Plan Medbridge Access Code [email or web address]   ? Consulted and Agree with Plan of Care Patient   ? ?  ?  ? ?  ? ? ? ? ? PT, DPT ? ?07/03/2021, 4:36 PM ? ?  ? ? ?

## 2021-07-08 ENCOUNTER — Ambulatory Visit: Payer: Medicare Other

## 2021-07-15 ENCOUNTER — Ambulatory Visit: Payer: Medicare Other | Attending: Family Medicine

## 2021-07-15 DIAGNOSIS — G8929 Other chronic pain: Secondary | ICD-10-CM | POA: Diagnosis present

## 2021-07-15 DIAGNOSIS — M545 Low back pain, unspecified: Secondary | ICD-10-CM | POA: Diagnosis present

## 2021-07-15 DIAGNOSIS — M5416 Radiculopathy, lumbar region: Secondary | ICD-10-CM | POA: Diagnosis present

## 2021-07-15 DIAGNOSIS — M6281 Muscle weakness (generalized): Secondary | ICD-10-CM | POA: Diagnosis present

## 2021-07-15 NOTE — Therapy (Signed)
?OUTPATIENT PHYSICAL THERAPY TREATMENT NOTE ? ? ?Patient Name: Kristin BongDorothy D Mcdowell ?MRN: [id number] ?DOB:April 14, 1960, 61 y.o., female ?Today's Date: 07/15/2021 ? ?PCP: Leanna SatoMiles, Linda M, MD ?REFERRING PROVIDER: Meeler, Jodelle GrossWhitney L, FNP ? ? PT End of Session - 07/15/21 1548   ? ? Visit Number 6   ? Number of Visits 17   ? Date for PT Re-Evaluation 08/14/21   ? Authorization Type 6   ? Authorization Time Period of 10 progress report   ? PT Start Time 1551   ? PT Stop Time 1631   ? PT Time Calculation (min) 40 min   ? Activity Tolerance Patient tolerated treatment well   ? Behavior [facility] for tasks assessed/performed   ? ?  ?  ? ?  ? ? ? ? ?Past Medical History:  ?Diagnosis Date  ? Depression   ? GERD (gastroesophageal reflux disease)   ? Hypertension   ? Seasonal allergies   ? ?Past Surgical History:  ?Procedure Laterality Date  ? ABDOMINAL HYSTERECTOMY    ? COLONOSCOPY WITH PROPOFOL N/A 08/30/2015  ? Procedure: COLONOSCOPY WITH PROPOFOL;  Surgeon: Midge Miniumarren Wohl, MD;  [facility];  Service: Endoscopy;  Laterality: N/A;  ? COLONOSCOPY WITH PROPOFOL N/A 06/14/2020  ? Procedure: COLONOSCOPY WITH PROPOFOL;  Surgeon: Wyline MoodAnna, Kiran, MD;  [facility];  Service: Gastroenterology;  Laterality: N/A;  ? COLONOSCOPY WITH PROPOFOL N/A 07/22/2020  ? Procedure: COLONOSCOPY WITH PROPOFOL;  Surgeon: Wyline MoodAnna, Kiran, MD;  [facility];  Service: Gastroenterology;  Laterality: N/A;  ? ESOPHAGOGASTRODUODENOSCOPY (EGD) WITH PROPOFOL N/A 07/22/2020  ? Procedure: ESOPHAGOGASTRODUODENOSCOPY (EGD) WITH PROPOFOL;  Surgeon: Wyline MoodAnna, Kiran, MD;  [facility];  Service: Gastroenterology;  Laterality: N/A;  ? LUMBAR DISC SURGERY    ? ?Patient Active Problem List  ? Diagnosis Date Noted  ? Dysphagia 07/10/2020  ? Colon cancer screening   ? Gastro-esophageal reflux disease without esophagitis 01/25/2015  ? Other fatigue 01/25/2015  ? Allergic rhinitis 01/31/2014  ? Essential (primary) hypertension 01/31/2014   ? ? ?REFERRING DIAG: Lumbar DDD, HNP, radiculitis, stenosis with neurogenic claudication, back muscle spasm ? ?THERAPY DIAG:  ?Chronic bilateral low back pain, unspecified whether sciatica present ? ?Muscle weakness (generalized) ? ?Radiculopathy, lumbar region ? ?PERTINENT HISTORY: Back pain. Was doing good after last session in December 2022 until her last MD appointment. Got a steriod/cortisone shot in her back 05/16/2021 which might have helped. No B LE paresthesia. No loss of bowel or bladder control. Back is not throbbing like it used to. Joined a gym recently. Pain has gotten a little better since December 2022. ? ?PRECAUTIONS: No known precautions ? ?SUBJECTIVE: Boyfriend had a kidney transplant. Had to sleep on the hospital couch and everything was hurting. Back is whining.  ? ? ? ?PAIN:  ?Are you having pain? Yes: NPRS scale: 4-5/10 ?Pain location: low back ?Pain description: ache ? ? ? ? ?TODAY'S TREATMENT:  ?Objective  ?  ?Medbridge Access Code [email or web address] ?  ?  ?Therapeutic exercise ?  ?Standing R trunk side bend 10x5 seconds for 2 sets  ? ?Seated trunk flexion stretch with green physioball ? To the R 10x10 seconds for 2 sets ? ?Pt states she feels like her blood pressure has been up (due to her streses at home) ? ?BP L arm sitting 128/80, HR 77, L arm sitting, mechanically taken, normal cuff.  ? ? ?Omega machine  ? Rows plate 10 for [id number] ? ? Lat pull down plate 15 for [id number] ? ? ?  Standing B shoulder extension plate 5 with 2 lbs dumbbell for 10x2 to promote trunk strength.  ? ?Seated trunk flexion stretch 5x10 seconds  ? ? ?Standing hip flexor stretch  ? L 30 seconds x 3 ? ? ? ?  ?Improved exercise technique, movement at target joints, use of target muscles after mod verbal, visual, tactile cues.  ?  ? ?  ?  ?Response to treatment ?Fair tolerance to today's session ? ? ?  ?  ?Clinical impression ?Worked on decreasing pressure to L low back as well as improving L hip flexor mobility, trunk strength, and  thoracic extension to decrease stress to affected areas. Fair tolerance to today's session. Pt will benefit from continued skilled physical therapy services to decrease pain, improve strength and function.  ? ? ? ?PATIENT EDUCATION: ?Education details: there-ex ?Person educated: Patient ?Education method: Explanation, Demonstration, Tactile cues, and Verbal cues ?Education comprehension: verbalized understanding and returned demonstration ? ? ?HOME EXERCISE PROGRAM: ?Access Code: [email or web address] ?URL: [email or web address] ?Date: 07/01/2021 ?Prepared by: Loralyn Freshwater ? ?Exercises ?- Supine Scapular Retraction  - 1 x daily - 7 x weekly - 3 sets - 10 reps - 5 seconds hold ?- Supine Transversus Abdominis Bracing - Hands on Stomach  - 1 x daily - 7 x weekly - 3 sets - 10 reps - 5 seconds hold ? ? ? ? ? ? ? ? PT Short Term Goals - 06/17/21 1702   ? ?  ? PT SHORT TERM GOAL #1  ? Title Pt will be independent with her initial HEP to decrease pain, improve strength and function.   ? Baseline Pt started her HEP (06/17/2021)   ? Time 3   ? Period Weeks   ? Status New   ? Target Date 07/10/21   ? ?  ?  ? ?  ? ? ? PT Long Term Goals - 06/17/21 1703   ? ?  ? PT LONG TERM GOAL #1  ? Title Pt will have a decrease in low back pain to 4/10 or less at worst to promote ability to perform tasks which involve lifting as well as be able to tolerate prolonged sitting and standing.   ? Baseline 9/10 low back pain at worst for the past 2 months (06/17/2021)   ? Time 8   ? Period Weeks   ? Status New   ? Target Date 08/14/21   ?  ? PT LONG TERM GOAL #2  ? Title Pt will improve her lumbar spine FOTO by at least 10 points as a demonstration of improved function.   ? Baseline Lumbar spine FOTO 54 (3/114/2023)   ? Time 8   ? Period Weeks   ? Status New   ? Target Date 08/14/21   ?  ? PT LONG TERM GOAL #3  ? Title Pt will improve bilateral hip extension and abduction strength by at least 1/2 MMT grade to promote ability to perform standing  tasks with less back pain.   ? Baseline Hip extension 4/5 R, and L, hip abduction 4/5 R, 4-/5 L (06/17/2021)   ? Time 8   ? Period Weeks   ? Status New   ? Target Date 08/14/21   ? ?  ?  ? ?  ? ? ? Plan - 07/15/21 1554   ? ? Clinical Impression Statement Worked on decreasing pressure to L low back as well as improving L hip flexor mobility, trunk strength, and thoracic extension to decrease stress to affected areas.  Fair tolerance to today's session. Pt will benefit from continued skilled physical therapy services to decrease pain, improve strength and function.   ? Personal Factors and Comorbidities Comorbidity 3+;Past/Current Experience;Time since onset of injury/illness/exacerbation   ? Comorbidities Depression, HTN, lumbar surgery   ? Examination-Activity Limitations Lift;Stand;Bend;Sit   ? Stability/Clinical Decision Making Stable/Uncomplicated   ? Rehab Potential Fair   ? PT Frequency 2x / week   ? PT Duration 8 weeks   ? PT Treatment/Interventions Therapeutic activities;Therapeutic exercise;Neuromuscular re-education;Manual techniques;Dry needling;Aquatic Therapy;Electrical Stimulation;Iontophoresis 4mg /ml Dexamethasone;Traction;Moist Heat;Functional mobility training;Patient/family education   ? PT Next Visit Plan posture, scapular, anterior cervical, trunk, hip strengthening, manual techniques, modalities PRN   ? PT Home Exercise Plan Medbridge Access Code [email or web address]   ? Consulted and Agree with Plan of Care Patient   ? ?  ?  ? ?  ? ? ? ? ? ? PT, DPT ? ?07/15/2021, 4:40 PM ? ?  ? ? ?

## 2021-07-17 ENCOUNTER — Ambulatory Visit: Payer: Medicare Other

## 2021-07-22 ENCOUNTER — Ambulatory Visit: Payer: Medicare Other

## 2021-07-22 DIAGNOSIS — M545 Low back pain, unspecified: Secondary | ICD-10-CM | POA: Diagnosis not present

## 2021-07-22 DIAGNOSIS — M6281 Muscle weakness (generalized): Secondary | ICD-10-CM

## 2021-07-22 DIAGNOSIS — M5416 Radiculopathy, lumbar region: Secondary | ICD-10-CM

## 2021-07-22 NOTE — Therapy (Signed)
?OUTPATIENT PHYSICAL THERAPY TREATMENT NOTE ? ? ?Patient Name: Kristin BongDorothy D Mcdowell ?MRN: [id number] ?DOB:Nov 17, 1960, 61 y.o., female ?Today's Date: 07/22/2021 ? ?PCP: Leanna SatoMiles, Linda M, MD ?REFERRING PROVIDER: Meeler, Jodelle GrossWhitney L, FNP ? ? PT End of Session - 07/22/21 1545   ? ? Visit Number 7   ? Number of Visits 17   ? Date for PT Re-Evaluation 08/14/21   ? Authorization Type 7   ? Authorization Time Period of 10 progress report   ? PT Start Time 1546   ? PT Stop Time 1631   ? PT Time Calculation (min) 45 min   ? Activity Tolerance Patient tolerated treatment well   ? Behavior [facility] for tasks assessed/performed   ? ?  ?  ? ?  ? ? ? ? ? ?Past Medical History:  ?Diagnosis Date  ? Depression   ? GERD (gastroesophageal reflux disease)   ? Hypertension   ? Seasonal allergies   ? ?Past Surgical History:  ?Procedure Laterality Date  ? ABDOMINAL HYSTERECTOMY    ? COLONOSCOPY WITH PROPOFOL N/A 08/30/2015  ? Procedure: COLONOSCOPY WITH PROPOFOL;  Surgeon: Midge Miniumarren Wohl, MD;  [facility];  Service: Endoscopy;  Laterality: N/A;  ? COLONOSCOPY WITH PROPOFOL N/A 06/14/2020  ? Procedure: COLONOSCOPY WITH PROPOFOL;  Surgeon: Wyline MoodAnna, Kiran, MD;  [facility];  Service: Gastroenterology;  Laterality: N/A;  ? COLONOSCOPY WITH PROPOFOL N/A 07/22/2020  ? Procedure: COLONOSCOPY WITH PROPOFOL;  Surgeon: Wyline MoodAnna, Kiran, MD;  [facility];  Service: Gastroenterology;  Laterality: N/A;  ? ESOPHAGOGASTRODUODENOSCOPY (EGD) WITH PROPOFOL N/A 07/22/2020  ? Procedure: ESOPHAGOGASTRODUODENOSCOPY (EGD) WITH PROPOFOL;  Surgeon: Wyline MoodAnna, Kiran, MD;  [facility];  Service: Gastroenterology;  Laterality: N/A;  ? LUMBAR DISC SURGERY    ? ?Patient Active Problem List  ? Diagnosis Date Noted  ? Dysphagia 07/10/2020  ? Colon cancer screening   ? Gastro-esophageal reflux disease without esophagitis 01/25/2015  ? Other fatigue 01/25/2015  ? Allergic rhinitis 01/31/2014  ? Essential (primary) hypertension  01/31/2014  ? ? ?REFERRING DIAG: Lumbar DDD, HNP, radiculitis, stenosis with neurogenic claudication, back muscle spasm ? ?THERAPY DIAG:  ?Chronic bilateral low back pain, unspecified whether sciatica present ? ?Muscle weakness (generalized) ? ?Radiculopathy, lumbar region ? ?PERTINENT HISTORY: Back pain. Was doing good after last session in December 2022 until her last MD appointment. Got a steriod/cortisone shot in her back 05/16/2021 which might have helped. No B LE paresthesia. No loss of bowel or bladder control. Back is not throbbing like it used to. Joined a gym recently. Pain has gotten a little better since December 2022. ? ?PRECAUTIONS: No known precautions ? ?SUBJECTIVE: Has been sleeping on a hard bed trying to take care of her boyfriend who recently had a kidney transplant. Leaning forward low back is sore, sitting up straight is better.  ? ? ? ? ?PAIN:  ?Are you having pain? Yes: NPRS scale: 4-5/10 ?Pain location: low back ?Pain description: ache ? ? ? ? ?TODAY'S TREATMENT:  ?Objective  ?  ?Medbridge Access Code [email or web address] ?  ?  ?Therapeutic exercise ?  ?Standing GENTLE L lateral shift correction, gentle 10x5 seconds for 3 sets ? ?Sitting B scapular retraction 10x5 seconds for 2 sets to promote thoracic extension and decrease low back pressure.  ? ?Reclined with moist heat to low back ?                ?  Hooklying lower trunk rotation 10x3 each side to promote mobility ? ? Transversus abdominis contraction 10x5 seconds for 3 sets ?                 ?               Transversus abdominis contraction with marches 10x3 each LE ?  ?Hooklying clamshells blue band 10x3 to promote glute med  strength  ?  ? ?  ?Improved exercise technique, movement at target joints, use of target muscles after mod verbal, visual, tactile cues.  ?  ?Manual therapy ?Seated STM to lumbar paraspinal muscle R > L to decrease muscle tension and fascial restrictions  ?  ?  ?Response to treatment ?Fair tolerance to today's  session ? ? ?  ?  ?Clinical impression ?Worked on improving posture, trunk mobility, trunk and glute strength to decrease stress to low back. Fair tolerance to today's session. Pt will benefit from continued skilled physical therapy services to decrease pain, improve strength and function.  ? ? ? ?PATIENT EDUCATION: ?Education details: there-ex ?Person educated: Patient ?Education method: Explanation, Demonstration, Tactile cues, and Verbal cues ?Education comprehension: verbalized understanding and returned demonstration ? ? ?HOME EXERCISE PROGRAM: ?Access Code: [email or web address] ?URL: [email or web address] ?Date: 07/01/2021 ?Prepared by: Loralyn Freshwater ? ?Exercises ?- Supine Scapular Retraction  - 1 x daily - 7 x weekly - 3 sets - 10 reps - 5 seconds hold ?- Supine Transversus Abdominis Bracing - Hands on Stomach  - 1 x daily - 7 x weekly - 3 sets - 10 reps - 5 seconds hold ? ? ? ? ? ? ? ? PT Short Term Goals - 06/17/21 1702   ? ?  ? PT SHORT TERM GOAL #1  ? Title Pt will be independent with her initial HEP to decrease pain, improve strength and function.   ? Baseline Pt started her HEP (06/17/2021)   ? Time 3   ? Period Weeks   ? Status New   ? Target Date 07/10/21   ? ?  ?  ? ?  ? ? ? PT Long Term Goals - 06/17/21 1703   ? ?  ? PT LONG TERM GOAL #1  ? Title Pt will have a decrease in low back pain to 4/10 or less at worst to promote ability to perform tasks which involve lifting as well as be able to tolerate prolonged sitting and standing.   ? Baseline 9/10 low back pain at worst for the past 2 months (06/17/2021)   ? Time 8   ? Period Weeks   ? Status New   ? Target Date 08/14/21   ?  ? PT LONG TERM GOAL #2  ? Title Pt will improve her lumbar spine FOTO by at least 10 points as a demonstration of improved function.   ? Baseline Lumbar spine FOTO 54 (3/114/2023)   ? Time 8   ? Period Weeks   ? Status New   ? Target Date 08/14/21   ?  ? PT LONG TERM GOAL #3  ? Title Pt will improve bilateral hip extension and  abduction strength by at least 1/2 MMT grade to promote ability to perform standing tasks with less back pain.   ? Baseline Hip extension 4/5 R, and L, hip abduction 4/5 R, 4-/5 L (06/17/2021)   ? Time 8   ? Period Weeks   ? Status New   ? Target Date 08/14/21   ? ?  ?  ? ?  ? ? ?  Plan - 07/22/21 1605   ? ? Clinical Impression Statement Worked on improving posture, trunk mobility, trunk and glute strength to decrease stress to low back. Fair tolerance to today's session. Pt will benefit from continued skilled physical therapy services to decrease pain, improve strength and function.   ? Personal Factors and Comorbidities Comorbidity 3+;Past/Current Experience;Time since onset of injury/illness/exacerbation   ? Comorbidities Depression, HTN, lumbar surgery   ? Examination-Activity Limitations Lift;Stand;Bend;Sit   ? Stability/Clinical Decision Making Stable/Uncomplicated   ? Clinical Decision Making Low   ? Rehab Potential Fair   ? PT Frequency 2x / week   ? PT Duration 8 weeks   ? PT Treatment/Interventions Therapeutic activities;Therapeutic exercise;Neuromuscular re-education;Manual techniques;Dry needling;Aquatic Therapy;Electrical Stimulation;Iontophoresis 4mg /ml Dexamethasone;Traction;Moist Heat;Functional mobility training;Patient/family education   ? PT Next Visit Plan posture, scapular, anterior cervical, trunk, hip strengthening, manual techniques, modalities PRN   ? PT Home Exercise Plan Medbridge Access Code [email or web address]   ? Consulted and Agree with Plan of Care Patient   ? ?  ?  ? ?  ? ? ? ? ? ? ? PT, DPT ? ?07/22/2021, 5:32 PM ? ?  ? ? ?

## 2021-07-24 ENCOUNTER — Ambulatory Visit: Payer: Medicare Other

## 2021-07-24 DIAGNOSIS — M6281 Muscle weakness (generalized): Secondary | ICD-10-CM

## 2021-07-24 DIAGNOSIS — M545 Low back pain, unspecified: Secondary | ICD-10-CM | POA: Diagnosis not present

## 2021-07-24 DIAGNOSIS — G8929 Other chronic pain: Secondary | ICD-10-CM

## 2021-07-24 DIAGNOSIS — M5416 Radiculopathy, lumbar region: Secondary | ICD-10-CM

## 2021-07-24 NOTE — Therapy (Signed)
?OUTPATIENT PHYSICAL THERAPY TREATMENT NOTE ? ? ?Patient Name: Kristin Mcdowell ?MRN: [id number] ?DOB:09-Feb-1961, 61 y.o., female ?Today's Date: 07/24/2021 ? ?PCP: Leanna Sato, MD ?REFERRING PROVIDER: Meeler, Jodelle Gross, FNP ? ? PT End of Session - 07/24/21 0935   ? ? Visit Number 8   ? Number of Visits 17   ? Date for PT Re-Evaluation 08/14/21   ? Authorization Type 8   ? Authorization Time Period of 10 progress report   ? PT Start Time 0935   ? PT Stop Time 1015   ? PT Time Calculation (min) 40 min   ? Activity Tolerance Patient tolerated treatment well   ? Behavior During Therapy [facility] for tasks assessed/performed   ? ?  ?  ? ?  ? ? ? ? ? ? ?Past Medical History:  ?Diagnosis Date  ? Depression   ? GERD (gastroesophageal reflux disease)   ? Hypertension   ? Seasonal allergies   ? ?Past Surgical History:  ?Procedure Laterality Date  ? ABDOMINAL HYSTERECTOMY    ? COLONOSCOPY WITH PROPOFOL N/A 08/30/2015  ? Procedure: COLONOSCOPY WITH PROPOFOL;  Surgeon: Midge Minium, MD;  Location: [facility];  Service: Endoscopy;  Laterality: N/A;  ? COLONOSCOPY WITH PROPOFOL N/A 06/14/2020  ? Procedure: COLONOSCOPY WITH PROPOFOL;  Surgeon: Wyline Mood, MD;  Location: [facility] ENDOSCOPY;  Service: Gastroenterology;  Laterality: N/A;  ? COLONOSCOPY WITH PROPOFOL N/A 07/22/2020  ? Procedure: COLONOSCOPY WITH PROPOFOL;  Surgeon: Wyline Mood, MD;  Location: [facility] ENDOSCOPY;  Service: Gastroenterology;  Laterality: N/A;  ? ESOPHAGOGASTRODUODENOSCOPY (EGD) WITH PROPOFOL N/A 07/22/2020  ? Procedure: ESOPHAGOGASTRODUODENOSCOPY (EGD) WITH PROPOFOL;  Surgeon: Wyline Mood, MD;  Location: [facility] ENDOSCOPY;  Service: Gastroenterology;  Laterality: N/A;  ? LUMBAR DISC SURGERY    ? ?Patient Active Problem List  ? Diagnosis Date Noted  ? Dysphagia 07/10/2020  ? Colon cancer screening   ? Gastro-esophageal reflux disease without esophagitis 01/25/2015  ? Other fatigue 01/25/2015  ? Allergic rhinitis 01/31/2014  ? Essential (primary) hypertension  01/31/2014  ? ? ?REFERRING DIAG: Lumbar DDD, HNP, radiculitis, stenosis with neurogenic claudication, back muscle spasm ? ?THERAPY DIAG:  ?Chronic bilateral low back pain, unspecified whether sciatica present ? ?Muscle weakness (generalized) ? ?Radiculopathy, lumbar region ? ?PERTINENT HISTORY: Back pain. Was doing good after last session in December 2022 until her last MD appointment. Got a steriod/cortisone shot in her back 05/16/2021 which might have helped. No B LE paresthesia. No loss of bowel or bladder control. Back is not throbbing like it used to. Joined a gym recently. Pain has gotten a little better since December 2022. ? ?PRECAUTIONS: No known precautions ? ?SUBJECTIVE: Back is not bad, just got a little whine this morning, about 4/10 today. Had to sleep on a hard couch yesterday.  ? ? ? ?PAIN:  ?Are you having pain? Yes: NPRS scale: 4/10 ?Pain location: low back ?Pain description: ache ? ? ? ?TODAY'S TREATMENT:  ?Objective  ?  ?Medbridge Access Code [email or web address] ?  ?  ?Therapeutic exercise ?  ?Standing GENTLE L lateral shift correction, gentle 10x5 seconds for 3 sets ? ?Reclined with moist heat to low back ?                ?               Hooklying lower trunk rotation 10x3 each side to promote mobility ? ? Transversus abdominis contraction 10x5 seconds for 3 sets ?                 ?  clamshells blue band 10x3 to promote glute med  strength  ?  ? ?   Transversus abdominis contraction with marches 10x2 each LE ?  ? B scapular retraction 10x5 seconds to promote thoracic extension and decrease low back pressure.  ? ?  ?Improved exercise technique, movement at target joints, use of target muscles after mod verbal, visual, tactile cues.  ?  ?Manual therapy  ?Reclined STM R hip flexor muscles to decrease extension pressure to low back in standing ? ? ?   ?Response to treatment ?Pt tolerated session well without aggravatino of symptoms.  ? ? ?  ?  ?Clinical impression ?Continued working on improving posture, trunk  mobility, trunk and glute strength to decrease stress to low back. Pt tolerated session well without aggravation of symptoms. Pt will benefit from continued skilled physical therapy services to decrease pain, improve strength and function.  ? ? ? ?PATIENT EDUCATION: ?Education details: there-ex ?Person educated: Patient ?Education method: Explanation, Demonstration, Tactile cues, and Verbal cues ?Education comprehension: verbalized understanding and returned demonstration ? ? ?HOME EXERCISE PROGRAM: ?Access Code: [email or web address] ?URL: [email or web address] ?Date: 07/01/2021 ?Prepared by: Loralyn FreshwaterMiguel Rye Decoste ? ?Exercises ?- Supine Scapular Retraction  - 1 x daily - 7 x weekly - 3 sets - 10 reps - 5 seconds hold ?- Supine Transversus Abdominis Bracing - Hands on Stomach  - 1 x daily - 7 x weekly - 3 sets - 10 reps - 5 seconds hold ? ? ? ? ? ? ? ? PT Short Term Goals - 06/17/21 1702   ? ?  ? PT SHORT TERM GOAL #1  ? Title Pt will be independent with her initial HEP to decrease pain, improve strength and function.   ? Baseline Pt started her HEP (06/17/2021)   ? Time 3   ? Period Weeks   ? Status New   ? Target Date 07/10/21   ? ?  ?  ? ?  ? ? ? PT Long Term Goals - 06/17/21 1703   ? ?  ? PT LONG TERM GOAL #1  ? Title Pt will have a decrease in low back pain to 4/10 or less at worst to promote ability to perform tasks which involve lifting as well as be able to tolerate prolonged sitting and standing.   ? Baseline 9/10 low back pain at worst for the past 2 months (06/17/2021)   ? Time 8   ? Period Weeks   ? Status New   ? Target Date 08/14/21   ?  ? PT LONG TERM GOAL #2  ? Title Pt will improve her lumbar spine FOTO by at least 10 points as a demonstration of improved function.   ? Baseline Lumbar spine FOTO 54 (3/114/2023)   ? Time 8   ? Period Weeks   ? Status New   ? Target Date 08/14/21   ?  ? PT LONG TERM GOAL #3  ? Title Pt will improve bilateral hip extension and abduction strength by at least 1/2 MMT grade to  promote ability to perform standing tasks with less back pain.   ? Baseline Hip extension 4/5 R, and L, hip abduction 4/5 R, 4-/5 L (06/17/2021)   ? Time 8   ? Period Weeks   ? Status New   ? Target Date 08/14/21   ? ?  ?  ? ?  ? ? ? Plan - 07/24/21 0956   ? ? Clinical Impression Statement Continued working on improving posture, trunk mobility, trunk and  glute strength to decrease stress to low back. Pt tolerated session well without aggravation of symptoms. Pt will benefit from continued skilled physical therapy services to decrease pain, improve strength and function.   ? Personal Factors and Comorbidities Comorbidity 3+;Past/Current Experience;Time since onset of injury/illness/exacerbation   ? Comorbidities Depression, HTN, lumbar surgery   ? Examination-Activity Limitations Lift;Stand;Bend;Sit   ? Stability/Clinical Decision Making Stable/Uncomplicated   ? Rehab Potential Fair   ? PT Frequency 2x / week   ? PT Duration 8 weeks   ? PT Treatment/Interventions Therapeutic activities;Therapeutic exercise;Neuromuscular re-education;Manual techniques;Dry needling;Aquatic Therapy;Electrical Stimulation;Iontophoresis 4mg /ml Dexamethasone;Traction;Moist Heat;Functional mobility training;Patient/family education   ? PT Next Visit Plan posture, scapular, anterior cervical, trunk, hip strengthening, manual techniques, modalities PRN   ? PT Home Exercise Plan Medbridge Access Code [email or web address]   ? Consulted and Agree with Plan of Care Patient   ? ?  ?  ? ?  ? ? ? ? ? ? ? ? PT, DPT ? ?07/24/2021, 1:13 PM ? ?  ? ? ?

## 2021-07-30 ENCOUNTER — Ambulatory Visit: Payer: Medicare Other

## 2021-07-30 DIAGNOSIS — M545 Low back pain, unspecified: Secondary | ICD-10-CM | POA: Diagnosis not present

## 2021-07-30 DIAGNOSIS — M6281 Muscle weakness (generalized): Secondary | ICD-10-CM

## 2021-07-30 DIAGNOSIS — G8929 Other chronic pain: Secondary | ICD-10-CM

## 2021-07-30 DIAGNOSIS — M5416 Radiculopathy, lumbar region: Secondary | ICD-10-CM

## 2021-07-30 NOTE — Therapy (Signed)
?OUTPATIENT PHYSICAL THERAPY TREATMENT NOTE ? ? ?Patient Name: Kristin Mcdowell ?MRN: [id number] ?DOB:June 10, 1960, 61 y.o., female ?Today's Date: 07/30/2021 ? ?PCP: Leanna Sato, MD ?REFERRING PROVIDER: Meeler, Jodelle Gross, FNP ? ? PT End of Session - 07/30/21 1551   ? ? Visit Number 9   ? Number of Visits 17   ? Date for PT Re-Evaluation 08/14/21   ? Authorization Type 9   ? Authorization Time Period of 10 progress report   ? PT Start Time 1551   ? PT Stop Time 1632   ? PT Time Calculation (min) 41 min   ? Activity Tolerance Patient tolerated treatment well   ? Behavior During Therapy [facility] for tasks assessed/performed   ? ?  ?  ? ?  ? ? ? ? ? ? ? ?Past Medical History:  ?Diagnosis Date  ? Depression   ? GERD (gastroesophageal reflux disease)   ? Hypertension   ? Seasonal allergies   ? ?Past Surgical History:  ?Procedure Laterality Date  ? ABDOMINAL HYSTERECTOMY    ? COLONOSCOPY WITH PROPOFOL N/A 08/30/2015  ? Procedure: COLONOSCOPY WITH PROPOFOL;  Surgeon: Midge Minium, MD;  Location: [facility];  Service: Endoscopy;  Laterality: N/A;  ? COLONOSCOPY WITH PROPOFOL N/A 06/14/2020  ? Procedure: COLONOSCOPY WITH PROPOFOL;  Surgeon: Wyline Mood, MD;  Location: [facility] ENDOSCOPY;  Service: Gastroenterology;  Laterality: N/A;  ? COLONOSCOPY WITH PROPOFOL N/A 07/22/2020  ? Procedure: COLONOSCOPY WITH PROPOFOL;  Surgeon: Wyline Mood, MD;  Location: [facility] ENDOSCOPY;  Service: Gastroenterology;  Laterality: N/A;  ? ESOPHAGOGASTRODUODENOSCOPY (EGD) WITH PROPOFOL N/A 07/22/2020  ? Procedure: ESOPHAGOGASTRODUODENOSCOPY (EGD) WITH PROPOFOL;  Surgeon: Wyline Mood, MD;  Location: [facility] ENDOSCOPY;  Service: Gastroenterology;  Laterality: N/A;  ? LUMBAR DISC SURGERY    ? ?Patient Active Problem List  ? Diagnosis Date Noted  ? Dysphagia 07/10/2020  ? Colon cancer screening   ? Gastro-esophageal reflux disease without esophagitis 01/25/2015  ? Other fatigue 01/25/2015  ? Allergic rhinitis 01/31/2014  ? Essential (primary) hypertension  01/31/2014  ? ? ?REFERRING DIAG: Lumbar DDD, HNP, radiculitis, stenosis with neurogenic claudication, back muscle spasm ? ?THERAPY DIAG:  ?Chronic bilateral low back pain, unspecified whether sciatica present ? ?Muscle weakness (generalized) ? ?Radiculopathy, lumbar region ? ?PERTINENT HISTORY: Back pain. Was doing good after last session in December 2022 until her last MD appointment. Got a steriod/cortisone shot in her back 05/16/2021 which might have helped. No B LE paresthesia. No loss of bowel or bladder control. Back is not throbbing like it used to. Joined a gym recently. Pain has gotten a little better since December 2022. ? ?PRECAUTIONS: No known precautions ? ?SUBJECTIVE: Back has been ok. Has not been sleeping on the hard bed. Boyfriend came home Thursday night last week.  ? ? ? ?PAIN:  ?Are you having pain? Yes: NPRS scale: 2/10 ?Pain location: low back ?Pain description: ache ? ? ? ?TODAY'S TREATMENT:  ?Objective  ?  ?Medbridge Access Code [email or web address] ?  ?  ?Therapeutic exercise ?  ?Standing GENTLE L lateral shift correction, gentle 10x5 seconds for 3 sets ? ?Reclined with moist heat to low back ?                ?               Hooklying lower trunk rotation 10x3 each side to promote mobility ? ? Transversus abdominis contraction 10x5 seconds for 3 sets ?                 ?  B scapular retraction 10x5 seconds for 2 sets to promote thoracic extension and decrease low back pressure.  ? ? clamshells blue band 10x3 to promote glute med  strength  ?  ? ? Hooklying hip extension isometric, leg straight  ?  R 10x5 seconds  ? ? ? ?   ?Improved exercise technique, movement at target joints, use of target muscles after mod verbal, visual, tactile cues.  ?  ?Manual therapy ?Reclined STM R hip flexor and tensor fascia lattae muscles to decrease extension pressure to low back in standing ? ? ?   ?Response to treatment ?Pt tolerated session well without aggravatino of symptoms.  ? ? ?  ?  ?Clinical impression ? ?Decreasing  starting pain levels based on subjective reports.Continued working on improving posture, trunk mobility, trunk and glute strength to decrease stress to low back. Pt tolerated session well without aggravation of symptoms. Pt will benefit from continued skilled physical therapy services to decrease pain, improve strength and function.  ? ? ? ?PATIENT EDUCATION: ?Education details: there-ex ?Person educated: Patient ?Education method: Explanation, Demonstration, Tactile cues, and Verbal cues ?Education comprehension: verbalized understanding and returned demonstration ? ? ?HOME EXERCISE PROGRAM: ?Access Code: [email or web address] ?URL: [email or web address] ?Date: 07/01/2021 ?Prepared by: Loralyn Freshwater ? ?Exercises ?- Supine Scapular Retraction  - 1 x daily - 7 x weekly - 3 sets - 10 reps - 5 seconds hold ?- Supine Transversus Abdominis Bracing - Hands on Stomach  - 1 x daily - 7 x weekly - 3 sets - 10 reps - 5 seconds hold ? ? ? ? ? ? ? ? PT Short Term Goals - 06/17/21 1702   ? ?  ? PT SHORT TERM GOAL #1  ? Title Pt will be independent with her initial HEP to decrease pain, improve strength and function.   ? Baseline Pt started her HEP (06/17/2021)   ? Time 3   ? Period Weeks   ? Status New   ? Target Date 07/10/21   ? ?  ?  ? ?  ? ? ? PT Long Term Goals - 06/17/21 1703   ? ?  ? PT LONG TERM GOAL #1  ? Title Pt will have a decrease in low back pain to 4/10 or less at worst to promote ability to perform tasks which involve lifting as well as be able to tolerate prolonged sitting and standing.   ? Baseline 9/10 low back pain at worst for the past 2 months (06/17/2021)   ? Time 8   ? Period Weeks   ? Status New   ? Target Date 08/14/21   ?  ? PT LONG TERM GOAL #2  ? Title Pt will improve her lumbar spine FOTO by at least 10 points as a demonstration of improved function.   ? Baseline Lumbar spine FOTO 54 (3/114/2023)   ? Time 8   ? Period Weeks   ? Status New   ? Target Date 08/14/21   ?  ? PT LONG TERM GOAL #3  ? Title  Pt will improve bilateral hip extension and abduction strength by at least 1/2 MMT grade to promote ability to perform standing tasks with less back pain.   ? Baseline Hip extension 4/5 R, and L, hip abduction 4/5 R, 4-/5 L (06/17/2021)   ? Time 8   ? Period Weeks   ? Status New   ? Target Date 08/14/21   ? ?  ?  ? ?  ? ? ? Plan -  07/30/21 1552   ? ? Clinical Impression Statement Decreasing starting pain levels based on subjective reports.Continued working on improving posture, trunk mobility, trunk and glute strength to decrease stress to low back. Pt tolerated session well without aggravation of symptoms. Pt will benefit from continued skilled physical therapy services to decrease pain, improve strength and function.   ? Personal Factors and Comorbidities Comorbidity 3+;Past/Current Experience;Time since onset of injury/illness/exacerbation   ? Comorbidities Depression, HTN, lumbar surgery   ? Examination-Activity Limitations Lift;Stand;Bend;Sit   ? Stability/Clinical Decision Making Stable/Uncomplicated   ? Clinical Decision Making Low   ? Rehab Potential Fair   ? PT Frequency 2x / week   ? PT Duration 8 weeks   ? PT Treatment/Interventions Therapeutic activities;Therapeutic exercise;Neuromuscular re-education;Manual techniques;Dry needling;Aquatic Therapy;Electrical Stimulation;Iontophoresis 4mg /ml Dexamethasone;Traction;Moist Heat;Functional mobility training;Patient/family education   ? PT Next Visit Plan posture, scapular, anterior cervical, trunk, hip strengthening, manual techniques, modalities PRN   ? PT Home Exercise Plan Medbridge Access Code [email or web address]   ? Consulted and Agree with Plan of Care Patient   ? ?  ?  ? ?  ? ? ? ? ? ? ? ? ?Loralyn FreshwaterMiguel Maeva Dant PT, DPT ? ?07/30/2021, 6:45 PM ? ?  ? ? ?

## 2021-08-13 ENCOUNTER — Ambulatory Visit: Payer: Medicare Other | Attending: Family Medicine

## 2021-08-13 DIAGNOSIS — M545 Low back pain, unspecified: Secondary | ICD-10-CM | POA: Insufficient documentation

## 2021-08-13 DIAGNOSIS — G8929 Other chronic pain: Secondary | ICD-10-CM | POA: Insufficient documentation

## 2021-08-13 DIAGNOSIS — M6281 Muscle weakness (generalized): Secondary | ICD-10-CM | POA: Insufficient documentation

## 2021-08-13 DIAGNOSIS — M5416 Radiculopathy, lumbar region: Secondary | ICD-10-CM | POA: Diagnosis present

## 2021-08-13 NOTE — Therapy (Addendum)
?OUTPATIENT PHYSICAL THERAPY TREATMENT NOTE ?And ?Progress Report ?(06/17/2021 - 08/13/2021) ? ? ?Patient Name: Kristin Mcdowell ?MRN: [id number] ?DOB:12-Aug-1960, 61 y.o., female ?Today's Date: 08/13/2021 ? ?PCP:  Marguerita Merles, MD ?REFERRING PROVIDER:  Meeler, Sherren Kerns, FNP ?  ? PT End of Session - 08/13/21 1459   ? ? Visit Number 10   ? Number of Visits 17   ? Date for PT Re-Evaluation 08/14/21   ? Authorization Type 10   ? Authorization Time Period of 10 progress report   ? PT Start Time 4098   ? PT Stop Time 1543   ? PT Time Calculation (min) 44 min   ? Activity Tolerance Patient tolerated treatment well   ? Behavior During Therapy [facility] for tasks assessed/performed   ? ?  ?  ? ?  ? ? ?Past Medical History:  ?Diagnosis Date  ? Depression   ? GERD (gastroesophageal reflux disease)   ? Hypertension   ? Seasonal allergies   ? ?Past Surgical History:  ?Procedure Laterality Date  ? ABDOMINAL HYSTERECTOMY    ? COLONOSCOPY WITH PROPOFOL N/A 08/30/2015  ? Procedure: COLONOSCOPY WITH PROPOFOL;  Surgeon: Lucilla Lame, MD;  Location: [place];  Service: Endoscopy;  Laterality: N/A;  ? COLONOSCOPY WITH PROPOFOL N/A 06/14/2020  ? Procedure: COLONOSCOPY WITH PROPOFOL;  Surgeon: [name], MD;  Location: [facility] ENDOSCOPY;  Service: Gastroenterology;  Laterality: N/A;  ? COLONOSCOPY WITH PROPOFOL N/A 07/22/2020  ? Procedure: COLONOSCOPY WITH PROPOFOL;  Surgeon: [name], MD;  Location: [facility] ENDOSCOPY;  Service: Gastroenterology;  Laterality: N/A;  ? ESOPHAGOGASTRODUODENOSCOPY (EGD) WITH PROPOFOL N/A 07/22/2020  ? Procedure: ESOPHAGOGASTRODUODENOSCOPY (EGD) WITH PROPOFOL;  Surgeon: [name], MD;  Location: [facility] ENDOSCOPY;  Service: Gastroenterology;  Laterality: N/A;  ? LUMBAR DISC SURGERY    ? ?Patient Active Problem List  ? Diagnosis Date Noted  ? Dysphagia 07/10/2020  ? Colon cancer screening   ? Gastro-esophageal reflux disease without esophagitis 01/25/2015  ? Other fatigue 01/25/2015  ? Allergic rhinitis  01/31/2014  ? Essential (primary) hypertension 01/31/2014  ? ? ?REFERRING DIAG: Lumbar DDD, HNP, radiculitis, stenosis with neurogenic claudication, back muscle spasm ? ?THERAPY DIAG:  ?Chronic bilateral low back pain, unspecified whether sciatica present ? ?Muscle weakness (generalized) ? ?Radiculopathy, lumbar region ? ?PERTINENT HISTORY: Back pain. Was doing good after last session in December 2022 until her last MD appointment. Got a steriod/cortisone shot in her back 05/16/2021 which might have helped. No B LE paresthesia. No loss of bowel or bladder control. Back is not throbbing like it used to. Joined a gym recently. Pain has gotten a little better since December 2022. ? ?PRECAUTIONS: No known precautions ? ?SUBJECTIVE: Back is a little whining, about a 4/10 currently. Better when she is up and moving around. Sleeping on her L side bothers her back. Its a whole lot better than it has been. Wants to wait to see the doctor prior to continuing PT.  ? ?PAIN:  ?Are you having pain? Yes: NPRS scale: 4/10 ?Pain location: low back ?Pain description: ache ? ? ? ?TODAY'S TREATMENT:  ?Objective  ?  ?Medbridge Access Code [email or web address] ?  ?  ?Therapeutic exercise ?  ?Seated manually resisted hip extension, S/L hip abduction 1-2x each way for each LE.  ? ?Reviewed progress/current status with PT towards goals.  ? ? ?Standing GENTLE L lateral shift correction, gentle 10x5 seconds for 3 sets ? ? ?Reclined with moist heat to low back ?                ?  Hooklying lower trunk rotation 10x3 each side to promote mobility ? ? clamshells blue band 10x3 to promote glute med  strength  ? ? Hooklying hip adduction small green ball squeeze 10x5 seconds for 2 sets ? ? Hooklying hip extension isometric, leg straight  ?  R 10x5 seconds  ? ? ?   ?Improved exercise technique, movement at target joints, use of target muscles after mod verbal, visual, tactile cues.  ?  ? ? ? ? ? ?   ?Response to treatment ?Pt tolerated session well  without aggravatino of symptoms.  ? ? ?  ?  ?Clinical impression ?Pt demonstrates overall all improved low back pain, as well as improved hip strength since initial evaluation. Pt making progress with PT towards goals. Continued working on improving posture, trunk mobility, trunk and glute strength to decrease stress to low back. Pt tolerated session well without aggravation of symptoms. Pt will benefit from continued skilled physical therapy services to decrease pain, improve strength and function.  ? ? ? ?PATIENT EDUCATION: ?Education details: there-ex ?Person educated: Patient ?Education method: Explanation, Demonstration, Tactile cues, and Verbal cues ?Education comprehension: verbalized understanding and returned demonstration ? ? ?HOME EXERCISE PROGRAM: ?Access Code: [email or web address] ?URL: [email or web address] ?Date: 07/01/2021 ?Prepared by: Joneen Boers ? ?Exercises ?- Supine Scapular Retraction  - 1 x daily - 7 x weekly - 3 sets - 10 reps - 5 seconds hold ?- Supine Transversus Abdominis Bracing - Hands on Stomach  - 1 x daily - 7 x weekly - 3 sets - 10 reps - 5 seconds hold ?- Left Standing Lateral Shift Correction at Wall - Hold  - 1 x daily - 7 x weekly - 3 sets - 10 reps - 5 seconds hold ?- Supine Lower Trunk Rotation  - 1 x daily - 7 x weekly - 3 sets - 10 reps ? ? ? PT Short Term Goals - 08/13/21 1735   ? ?  ? PT SHORT TERM GOAL #1  ? Title Pt will be independent with her initial HEP to decrease pain, improve strength and function.   ? Baseline Pt started her HEP (06/17/2021); Pt performing her HEP. No questions so far (08/13/2021)   ? Time 3   ? Period Weeks   ? Status Achieved   ? Target Date 07/10/21   ? ?  ?  ? ?  ? ? ? PT Long Term Goals - 08/13/21 1501   ? ?  ? PT LONG TERM GOAL #1  ? Title Pt will have a decrease in low back pain to 4/10 or less at worst to promote ability to perform tasks which involve lifting as well as be able to tolerate prolonged sitting and standing.   ? Baseline 9/10  low back pain at worst for the past 2 months (06/17/2021); 4/10 (08/13/2021)   ? Time 8   ? Period Weeks   ? Status Partially Met   ? Target Date 08/14/21   ?  ? PT LONG TERM GOAL #2  ? Title Pt will improve her lumbar spine FOTO by at least 10 points as a demonstration of improved function.   ? Baseline Lumbar spine FOTO 54 (3/114/2023)   ? Time 8   ? Period Weeks   ? Status New   ? Target Date 08/14/21   ?  ? PT LONG TERM GOAL #3  ? Title Pt will improve bilateral hip extension and abduction strength by at least 1/2 MMT grade to promote ability to perform standing  tasks with less back pain.   ? Baseline Hip extension 4/5 R, and L, hip abduction 4/5 R, 4-/5 L (06/17/2021); hip extension 5/5 R, 5/5 L, hip abduction 4/5 R, 4/5 L (08/13/2021)   ? Time 8   ? Period Weeks   ? Status Partially Met   ? Target Date 08/14/21   ? ?  ?  ? ?  ? ? ? Plan - 08/13/21 1458   ? ? Clinical Impression Statement Pt demonstrates overall all improved low back pain, as well as improved hip strength since initial evaluation. Pt making progress with PT towards goals. Continued working on improving posture, trunk mobility, trunk and glute strength to decrease stress to low back. Pt tolerated session well without aggravation of symptoms. Pt will benefit from continued skilled physical therapy services to decrease pain, improve strength and function.   ? Personal Factors and Comorbidities Comorbidity 3+;Past/Current Experience;Time since onset of injury/illness/exacerbation   ? Comorbidities Depression, HTN, lumbar surgery   ? Examination-Activity Limitations Lift;Stand;Bend;Sit   ? Stability/Clinical Decision Making Stable/Uncomplicated   ? Clinical Decision Making Low   ? Rehab Potential Fair   ? PT Frequency 2x / week   ? PT Duration 8 weeks   ? PT Treatment/Interventions Therapeutic activities;Therapeutic exercise;Neuromuscular re-education;Manual techniques;Dry needling;Aquatic Therapy;Electrical Stimulation;Iontophoresis 73m/ml  Dexamethasone;Traction;Moist Heat;Functional mobility training;Patient/family education   ? PT Next Visit Plan posture, scapular, anterior cervical, trunk, hip strengthening, manual techniques, modalities PRN   ? PT Home

## 2021-08-19 ENCOUNTER — Ambulatory Visit: Payer: Medicare Other

## 2021-08-19 DIAGNOSIS — M6281 Muscle weakness (generalized): Secondary | ICD-10-CM

## 2021-08-19 DIAGNOSIS — M545 Low back pain, unspecified: Secondary | ICD-10-CM

## 2021-08-19 DIAGNOSIS — M5416 Radiculopathy, lumbar region: Secondary | ICD-10-CM

## 2021-08-19 NOTE — Therapy (Signed)
?OUTPATIENT PHYSICAL THERAPY TREATMENT NOTE ? ? ?Patient Name: Kristin Mcdowell ?MRN: [id number] ?DOB:07/02/1960, 61 y.o., female ?Today's Date: 08/19/2021 ? ?PCP:  Marguerita Merles, MD ?REFERRING PROVIDER:  Meeler, Sherren Kerns, FNP ?  ? PT End of Session - 08/19/21 1419   ? ? Visit Number 11   ? Number of Visits 25   ? Date for PT Re-Evaluation 09/18/21   ? Authorization Type 1   ? Authorization Time Period of 10 progress report   ? PT Start Time 1419   ? PT Stop Time 0174   ? PT Time Calculation (min) 39 min   ? Activity Tolerance Patient tolerated treatment well   ? Behavior During Therapy [facility] for tasks assessed/performed   ? ?  ?  ? ?  ? ? ? ?Past Medical History:  ?Diagnosis Date  ? Depression   ? GERD (gastroesophageal reflux disease)   ? Hypertension   ? Seasonal allergies   ? ?Past Surgical History:  ?Procedure Laterality Date  ? ABDOMINAL HYSTERECTOMY    ? COLONOSCOPY WITH PROPOFOL N/A 08/30/2015  ? Procedure: COLONOSCOPY WITH PROPOFOL;  Surgeon: Lucilla Lame, MD;  Location: [place];  Service: Endoscopy;  Laterality: N/A;  ? COLONOSCOPY WITH PROPOFOL N/A 06/14/2020  ? Procedure: COLONOSCOPY WITH PROPOFOL;  Surgeon: [name], MD;  Location: [facility] ENDOSCOPY;  Service: Gastroenterology;  Laterality: N/A;  ? COLONOSCOPY WITH PROPOFOL N/A 07/22/2020  ? Procedure: COLONOSCOPY WITH PROPOFOL;  Surgeon: [name], MD;  Location: [facility] ENDOSCOPY;  Service: Gastroenterology;  Laterality: N/A;  ? ESOPHAGOGASTRODUODENOSCOPY (EGD) WITH PROPOFOL N/A 07/22/2020  ? Procedure: ESOPHAGOGASTRODUODENOSCOPY (EGD) WITH PROPOFOL;  Surgeon: [name], MD;  Location: [facility] ENDOSCOPY;  Service: Gastroenterology;  Laterality: N/A;  ? LUMBAR DISC SURGERY    ? ?Patient Active Problem List  ? Diagnosis Date Noted  ? Dysphagia 07/10/2020  ? Colon cancer screening   ? Gastro-esophageal reflux disease without esophagitis 01/25/2015  ? Other fatigue 01/25/2015  ? Allergic rhinitis 01/31/2014  ? Essential (primary) hypertension  01/31/2014  ? ? ?REFERRING DIAG: Lumbar DDD, HNP, radiculitis, stenosis with neurogenic claudication, back muscle spasm ? ?THERAPY DIAG:  ?Chronic bilateral low back pain, unspecified whether sciatica present ? ?Muscle weakness (generalized) ? ?Radiculopathy, lumbar region ? ?PERTINENT HISTORY: Back pain. Was doing good after last session in December 2022 until her last MD appointment. Got a steriod/cortisone shot in her back 05/16/2021 which might have helped. No B LE paresthesia. No loss of bowel or bladder control. Back is not throbbing like it used to. Joined a gym recently. Pain has gotten a little better since December 2022. ? ?PRECAUTIONS: No known precautions ? ?SUBJECTIVE: Sees the doctor Aug 22, 2021 and wants to continue PT and see if MD wants to stop or continue. Sleeping on her L side bothers her. Sleeping bothers you. Lifting a bag of groceries is ok. Does pretty good for her back during the day... its just at night.  ? ? ? ? ?PAIN:  ?Are you having pain? Yes: NPRS scale: 4/10 ?Pain location: low back ?Pain description: ache ? ? ? ?TODAY'S TREATMENT:  ?Objective  ?  ?Medbridge Access Code [email or web address] ?  ?  ?Therapeutic exercise ?  ?Reviewed progress/current status with PT towards goals.  ?Reviewed POC: 2x/week for 4 weeks ? ? ?Ascending and descending 4 regular steps with B UE assist 4x. No UE assist last 2 times.  ? No difficulty observed.  ? ?Side stepping, yellow band around knees 32 ft to the L and  32 ft to the R for 2 sets ? ?Standing B UE assist  ? Hip extension with yellow band around knees ?  R 10x3 ?  L 10x3 ? ?Standing GENTLE L lateral shift correction, gentle 10x5 seconds for 3 sets ? ? ?   ?Improved exercise technique, movement at target joints, use of target muscles after mod verbal, visual, tactile cues.  ?  ? ? ? ?Manual therapy ?Seated STM to lumbar paraspinal muscle R > L to decrease muscle tension and fascial restrictions  ? ? ? ? ?   ?Response to treatment ?Pt tolerated session well  without aggravatino of symptoms.  ? ? ?  ?  ?Clinical impression ? ?Pt demonstrates overall decreased low back pain, improved bilateral hip strength since initial evaluation. FOTO score however decreased. Able to ascend and descend stairs without difficulty observed in the clinic. Overall pt seems to be making progress with PT towards goals.  Pt tolerated session well without aggravation of symptoms. Pt will benefit from continued skilled physical therapy services to decrease pain, improve strength and function.  ? ? ? ? ? ? ? ?PATIENT EDUCATION: ?Education details: there-ex ?Person educated: Patient ?Education method: Explanation, Demonstration, Tactile cues, and Verbal cues ?Education comprehension: verbalized understanding and returned demonstration ? ? ?HOME EXERCISE PROGRAM: ?Access Code: [email or web address] ?URL: [email or web address] ?Date: 07/01/2021 ?Prepared by: Joneen Boers ? ?Exercises ?- Supine Scapular Retraction  - 1 x daily - 7 x weekly - 3 sets - 10 reps - 5 seconds hold ?- Supine Transversus Abdominis Bracing - Hands on Stomach  - 1 x daily - 7 x weekly - 3 sets - 10 reps - 5 seconds hold ?- Left Standing Lateral Shift Correction at [place] - Hold  - 1 x daily - 7 x weekly - 3 sets - 10 reps - 5 seconds hold ?- Supine Lower Trunk Rotation  - 1 x daily - 7 x weekly - 3 sets - 10 reps ? ? ? PT Short Term Goals - 08/13/21 1735   ? ?  ? PT SHORT TERM GOAL #1  ? Title Pt will be independent with her initial HEP to decrease pain, improve strength and function.   ? Baseline Pt started her HEP (06/17/2021); Pt performing her HEP. No questions so far (08/13/2021)   ? Time 3   ? Period Weeks   ? Status Achieved   ? Target Date 07/10/21   ? ?  ?  ? ?  ? ? ? PT Long Term Goals - 08/19/21 1636   ? ?  ? PT LONG TERM GOAL #1  ? Title Pt will have a decrease in low back pain to 4/10 or less at worst to promote ability to perform tasks which involve lifting as well as be able to tolerate prolonged sitting and  standing.   ? Baseline 9/10 low back pain at worst for the past 2 months (06/17/2021); 4/10 (08/13/2021)   ? Time 8   ? Period Weeks   ? Status Partially Met   ? Target Date 09/18/21   ?  ? PT LONG TERM GOAL #2  ? Title Pt will improve her lumbar spine FOTO by at least 10 points as a demonstration of improved function.   ? Baseline Lumbar spine FOTO 54 (3/114/2023); 45 (08/13/2021)   ? Time 8   ? Period Weeks   ? Status On-going   ? Target Date 09/18/21   ?  ? PT LONG TERM GOAL #3  ? Title Pt  will improve bilateral hip extension and abduction strength by at least 1/2 MMT grade to promote ability to perform standing tasks with less back pain.   ? Baseline Hip extension 4/5 R, and L, hip abduction 4/5 R, 4-/5 L (06/17/2021); hip extension 5/5 R, 5/5 L, hip abduction 4/5 R, 4/5 L (08/13/2021)   ? Time 8   ? Period Weeks   ? Status Partially Met   ? Target Date 09/18/21   ? ?  ?  ? ?  ? ? ? Plan - 08/19/21 1419   ? ? Clinical Impression Statement Pt demonstrates overall decreased low back pain, improved bilateral hip strength since initial evaluation. FOTO score however decreased. Able to ascend and descend stairs without difficulty observed in the clinic. Overall pt seems to be making progress with PT towards goals.  Pt tolerated session well without aggravation of symptoms. Pt will benefit from continued skilled physical therapy services to decrease pain, improve strength and function.   ? Personal Factors and Comorbidities Comorbidity 3+;Past/Current Experience;Time since onset of injury/illness/exacerbation   ? Comorbidities Depression, HTN, lumbar surgery   ? Examination-Activity Limitations Lift;Stand;Bend;Sit   ? Stability/Clinical Decision Making Stable/Uncomplicated   ? Rehab Potential Fair   ? PT Frequency 2x / week   ? PT Duration 4 weeks   ? PT Treatment/Interventions Therapeutic activities;Therapeutic exercise;Neuromuscular re-education;Manual techniques;Dry needling;Aquatic Therapy;Electrical  Stimulation;Iontophoresis 76m/ml Dexamethasone;Traction;Moist Heat;Functional mobility training;Patient/family education   ? PT Next Visit Plan posture, scapular, anterior cervical, trunk, hip strengthening, manual techniques, modalities

## 2021-08-21 ENCOUNTER — Ambulatory Visit: Payer: Medicare Other

## 2021-08-21 DIAGNOSIS — M545 Low back pain, unspecified: Secondary | ICD-10-CM | POA: Diagnosis not present

## 2021-08-21 DIAGNOSIS — M5416 Radiculopathy, lumbar region: Secondary | ICD-10-CM

## 2021-08-21 DIAGNOSIS — M6281 Muscle weakness (generalized): Secondary | ICD-10-CM

## 2021-08-21 NOTE — Therapy (Signed)
OUTPATIENT PHYSICAL THERAPY TREATMENT NOTE   Patient Name: Kristin Mcdowell MRN: [id number] DOB:08-31-1960, 61 y.o., female Today's Date: 08/21/2021  PCP:  Marguerita Merles, MD REFERRING PROVIDER:  Meeler, Sherren Kerns, FNP    PT End of Session - 08/21/21 1632     Visit Number 12    Number of Visits 25    Date for PT Re-Evaluation 09/18/21    Authorization Type 2    Authorization Time Period of 10 progress report    PT Start Time 1633    PT Stop Time 1712    PT Time Calculation (min) 39 min    Activity Tolerance Patient tolerated treatment well    Behavior During Therapy WFL for tasks assessed/performed               Past Medical History:  Diagnosis Date   Depression    GERD (gastroesophageal reflux disease)    Hypertension    Seasonal allergies    Past Surgical History:  Procedure Laterality Date   ABDOMINAL HYSTERECTOMY     COLONOSCOPY WITH PROPOFOL N/A 08/30/2015   Procedure: COLONOSCOPY WITH PROPOFOL;  Surgeon: Lucilla Lame, MD;  Location[place];  Service: Endoscopy;  Laterality: N/A;   COLONOSCOPY WITH PROPOFOL N/A 06/14/2020   Procedure: COLONOSCOPY WITH PROPOFOL;  Surgeon: [name], MD;  Location: [facility] ENDOSCOPY;  Service: Gastroenterology;  Laterality: N/A;   COLONOSCOPY WITH PROPOFOL N/A 07/22/2020   Procedure: COLONOSCOPY WITH PROPOFOL;  Surgeon: [name], MD;  Location: [facility] ENDOSCOPY;  Service: Gastroenterology;  Laterality: N/A;   ESOPHAGOGASTRODUODENOSCOPY (EGD) WITH PROPOFOL N/A 07/22/2020   Procedure: ESOPHAGOGASTRODUODENOSCOPY (EGD) WITH PROPOFOL;  Surgeon: [name], MD;  Location: [facility] ENDOSCOPY;  Service: Gastroenterology;  Laterality: N/A;   LUMBAR DISC SURGERY     Patient Active Problem List   Diagnosis Date Noted   Dysphagia 07/10/2020   Colon cancer screening    Gastro-esophageal reflux disease without esophagitis 01/25/2015   Other fatigue 01/25/2015   Allergic rhinitis 01/31/2014   Essential (primary) hypertension  01/31/2014    REFERRING DIAG: Lumbar DDD, HNP, radiculitis, stenosis with neurogenic claudication, back muscle spasm  THERAPY DIAG:  Chronic bilateral low back pain, unspecified whether sciatica present  Muscle weakness (generalized)  Radiculopathy, lumbar region  PERTINENT HISTORY: Back pain. Was doing good after last session in December 2022 until her last MD appointment. Got a steriod/cortisone shot in her back 05/16/2021 which might have helped. No B LE paresthesia. No loss of bowel or bladder control. Back is not throbbing like it used to. Joined a gym recently. Pain has gotten a little better since December 2022.  PRECAUTIONS: No known precautions  SUBJECTIVE: Back feels ok today. So long as she is not moving around. Whines at night. Moving around loosens back and feels better.    PAIN:  Are you having pain? Yes: NPRS scale: 3-4/10 Pain location: low back      TODAY'S TREATMENT:  Objective    Medbridge Access Code [email or web address]     Therapeutic exercise   Seated trunk rotation 10x3 each direction  Standing GENTLE L lateral shift correction, gentle 10x5 seconds for 3 sets  Side stepping, yellow band around knees 32 ft to the L and 32 ft to the R for 2 sets  Standing B UE assist   Hip extension with yellow band around knees   R 10x3   L 10x3  Standing mini squats 3x  Standing gentle back extension 2x5 seconds. L low back discomfort  Seated transversus abdominis  contraction 10x5 seconds for 3 sets      Improved exercise technique, movement at target joints, use of target muscles after mod verbal, visual, tactile cues.       Manual therapy Seated STM to lumbar paraspinal muscle R > L to decrease muscle tension and fascial restrictions         Response to treatment Pt tolerated session well without aggravation of symptoms. Back not really bothering her after session. Decreased tightness reported.        Clinical impression  Continued working on  improving posture, trunk and glute strength as well as decreasing lumbar paraspinal muscle tension to decrease stress to low back. Decreased low back discomfort reported after session. Pt will benefit from continued skilled physical therapy services to decrease pain, improve strength and function.         PATIENT EDUCATION: Education details: there-ex Person educated: Patient Education method: Explanation, Demonstration, Corporate treasurer cues, and Verbal cues Education comprehension: verbalized understanding and returned demonstration   HOME EXERCISE PROGRAM: Access Code: [email or web address] URL: [email or web address] Date: 07/01/2021 Prepared by: Joneen Boers  Exercises - Supine Scapular Retraction  - 1 x daily - 7 x weekly - 3 sets - 10 reps - 5 seconds hold - Supine Transversus Abdominis Bracing - Hands on Stomach  - 1 x daily - 7 x weekly - 3 sets - 10 reps - 5 seconds hold - Left Standing Lateral Shift Correction at [place]  - 1 x daily - 7 x weekly - 3 sets - 10 reps - 5 seconds hold - Supine Lower Trunk Rotation  - 1 x daily - 7 x weekly - 3 sets - 10 reps    PT Short Term Goals - 08/13/21 1735       PT SHORT TERM GOAL #1   Title Pt will be independent with her initial HEP to decrease pain, improve strength and function.    Baseline Pt started her HEP (06/17/2021); Pt performing her HEP. No questions so far (08/13/2021)    Time 3    Period Weeks    Status Achieved    Target Date 07/10/21              PT Long Term Goals - 08/19/21 1636       PT LONG TERM GOAL #1   Title Pt will have a decrease in low back pain to 4/10 or less at worst to promote ability to perform tasks which involve lifting as well as be able to tolerate prolonged sitting and standing.    Baseline 9/10 low back pain at worst for the past 2 months (06/17/2021); 4/10 (08/13/2021)    Time 8    Period Weeks    Status Partially Met    Target Date 09/18/21      PT LONG TERM GOAL #2   Title Pt will  improve her lumbar spine FOTO by at least 10 points as a demonstration of improved function.    Baseline Lumbar spine FOTO 54 (3/114/2023); 45 (08/13/2021)    Time 8    Period Weeks    Status On-going    Target Date 09/18/21      PT LONG TERM GOAL #3   Title Pt will improve bilateral hip extension and abduction strength by at least 1/2 MMT grade to promote ability to perform standing tasks with less back pain.    Baseline Hip extension 4/5 R, and L, hip abduction 4/5 R, 4-/5 L (06/17/2021); hip extension 5/5 R, 5/5  L, hip abduction 4/5 R, 4/5 L (08/13/2021)    Time 8    Period Weeks    Status Partially Met    Target Date 09/18/21              Plan - 08/21/21 1632     Clinical Impression Statement Continued working on improving posture, trunk and glute strength as well as decreasing lumbar paraspinal muscle tension to decrease stress to low back. Decreased low back discomfort reported after session. Pt will benefit from continued skilled physical therapy services to decrease pain, improve strength and function.    Personal Factors and Comorbidities Comorbidity 3+;Past/Current Experience;Time since onset of injury/illness/exacerbation    Comorbidities Depression, HTN, lumbar surgery    Examination-Activity Limitations Lift;Stand;Bend;Sit    Stability/Clinical Decision Making Stable/Uncomplicated    Clinical Decision Making Low    Rehab Potential Fair    PT Frequency 2x / week    PT Duration 4 weeks    PT Treatment/Interventions Therapeutic activities;Therapeutic exercise;Neuromuscular re-education;Manual techniques;Dry needling;Aquatic Therapy;Electrical Stimulation;Iontophoresis 25m/ml Dexamethasone;Traction;Moist Heat;Functional mobility training;Patient/family education    PT Next Visit Plan posture, scapular, anterior cervical, trunk, hip strengthening, manual techniques, modalities PRN    PT Home Exercise Plan Medbridge [facility]   Consulted and Agree with Plan of Care  Patient                MJoneen BoersPT, DPT   08/21/2021, 5:16 PM

## 2021-08-26 ENCOUNTER — Ambulatory Visit: Payer: Medicare Other

## 2021-08-26 DIAGNOSIS — M6281 Muscle weakness (generalized): Secondary | ICD-10-CM

## 2021-08-26 DIAGNOSIS — G8929 Other chronic pain: Secondary | ICD-10-CM

## 2021-08-26 DIAGNOSIS — M545 Low back pain, unspecified: Secondary | ICD-10-CM | POA: Diagnosis not present

## 2021-08-26 DIAGNOSIS — M5416 Radiculopathy, lumbar region: Secondary | ICD-10-CM

## 2021-08-26 NOTE — Therapy (Signed)
OUTPATIENT PHYSICAL THERAPY TREATMENT NOTE   Patient Name: Kristin Mcdowell MRN: [id number] DOB:1960-09-25, 61 y.o., female Today's Date: 08/26/2021  PCP:  Marguerita Merles, MD REFERRING PROVIDER:  Meeler, Sherren Kerns, FNP    PT End of Session - 08/26/21 1417     Visit Number 13    Number of Visits 25    Date for PT Re-Evaluation 09/18/21    Authorization Type 3    Authorization Time Period of 10 progress report    PT Start Time 1417    PT Stop Time 1457    PT Time Calculation (min) 40 min    Activity Tolerance Patient tolerated treatment well    Behavior During Therapy WFL for tasks assessed/performed                Past Medical History:  Diagnosis Date   Depression    GERD (gastroesophageal reflux disease)    Hypertension    Seasonal allergies    Past Surgical History:  Procedure Laterality Date   ABDOMINAL HYSTERECTOMY     COLONOSCOPY WITH PROPOFOL N/A 08/30/2015   Procedure: COLONOSCOPY WITH PROPOFOL;  Surgeon: Lucilla Lame, MD;  Location: [place];  Service: Endoscopy;  Laterality: N/A;   COLONOSCOPY WITH PROPOFOL N/A 06/14/2020   Procedure: COLONOSCOPY WITH PROPOFOL;  Surgeon: [name], MD;  Location: [facility] ENDOSCOPY;  Service: Gastroenterology;  Laterality: N/A;   COLONOSCOPY WITH PROPOFOL N/A 07/22/2020   Procedure: COLONOSCOPY WITH PROPOFOL;  Surgeon: [name], MD;  Location: [facility] ENDOSCOPY;  Service: Gastroenterology;  Laterality: N/A;   ESOPHAGOGASTRODUODENOSCOPY (EGD) WITH PROPOFOL N/A 07/22/2020   Procedure: ESOPHAGOGASTRODUODENOSCOPY (EGD) WITH PROPOFOL;  Surgeon: [name], MD;  Location: [facility] ENDOSCOPY;  Service: Gastroenterology;  Laterality: N/A;   LUMBAR DISC SURGERY     Patient Active Problem List   Diagnosis Date Noted   Dysphagia 07/10/2020   Colon cancer screening    Gastro-esophageal reflux disease without esophagitis 01/25/2015   Other fatigue 01/25/2015   Allergic rhinitis 01/31/2014   Essential (primary) hypertension  01/31/2014    REFERRING DIAG: Lumbar DDD, HNP, radiculitis, stenosis with neurogenic claudication, back muscle spasm  THERAPY DIAG:  Chronic bilateral low back pain, unspecified whether sciatica present  Muscle weakness (generalized)  Radiculopathy, lumbar region  PERTINENT HISTORY: Back pain. Was doing good after last session in December 2022 until her last MD appointment. Got a steriod/cortisone shot in her back 05/16/2021 which might have helped. No B LE paresthesia. No loss of bowel or bladder control. Back is not throbbing like it used to. Joined a gym recently. Pain has gotten a little better since December 2022.  PRECAUTIONS: No known precautions  SUBJECTIVE: Doctor wants her to continue PT for another 4 weeks. Not really bothering her currently. Just laying on it at night bothers her.      PAIN:  Are you having pain? Yes: NPRS scale: 0/10 Pain location: low back      TODAY'S TREATMENT:  Objective    Medbridge Access Code [facility]     September 25, 2021 = 4 weeks from now    Therapeutic exercise   Standing GENTLE L lateral shift correction, gentle 10x5 seconds for 3 sets  Seated trunk rotation 10x3 each direction  Seated transversus abdominis contraction 10x5 seconds for 3 sets  Side stepping, yellow band around knees 32 ft to the L and 32 ft to the R for 2 sets  Standing B UE assist   Hip extension with yellow band around knees   R  10x3   L 10x3  Standing mini squats 5x       Improved exercise technique, movement at target joints, use of target muscles after mod verbal, visual, tactile cues.       Manual therapy  Seated STM to lumbar paraspinal muscle R > L to decrease muscle tension and fascial restrictions         Response to treatment Pt tolerated session well without aggravation of symptoms.      Clinical impression  Good response to previous session with no low back starting pain reported today. Continued working on improving  posture, trunk and glute strength as well as decreasing lumbar paraspinal muscle tension to decrease stress to low back. Pt tolerated session well without aggravation of symptoms. Pt will benefit from continued skilled physical therapy services to decrease pain, improve strength and function.         PATIENT EDUCATION: Education details: there-ex Person educated: Patient Education method: Explanation, Demonstration, Corporate treasurer cues, and Verbal cues Education comprehension: verbalized understanding and returned demonstration   HOME EXERCISE PROGRAM: Access Code: [email or web address] URL: [email or web address] Date: 07/01/2021 Prepared by: Joneen Boers  Exercises - Supine Scapular Retraction  - 1 x daily - 7 x weekly - 3 sets - 10 reps - 5 seconds hold - Supine Transversus Abdominis Bracing - Hands on Stomach  - 1 x daily - 7 x weekly - 3 sets - 10 reps - 5 seconds hold - Left Standing Lateral Shift Correction at [place]  - 1 x daily - 7 x weekly - 3 sets - 10 reps - 5 seconds hold - Supine Lower Trunk Rotation  - 1 x daily - 7 x weekly - 3 sets - 10 reps    PT Short Term Goals - 08/13/21 1735       PT SHORT TERM GOAL #1   Title Pt will be independent with her initial HEP to decrease pain, improve strength and function.    Baseline Pt started her HEP (06/17/2021); Pt performing her HEP. No questions so far (08/13/2021)    Time 3    Period Weeks    Status Achieved    Target Date 07/10/21              PT Long Term Goals - 08/19/21 1636       PT LONG TERM GOAL #1   Title Pt will have a decrease in low back pain to 4/10 or less at worst to promote ability to perform tasks which involve lifting as well as be able to tolerate prolonged sitting and standing.    Baseline 9/10 low back pain at worst for the past 2 months (06/17/2021); 4/10 (08/13/2021)    Time 8    Period Weeks    Status Partially Met    Target Date 09/18/21      PT LONG TERM GOAL #2   Title Pt will  improve her lumbar spine FOTO by at least 10 points as a demonstration of improved function.    Baseline Lumbar spine FOTO 54 (3/114/2023); 45 (08/13/2021)    Time 8    Period Weeks    Status On-going    Target Date 09/18/21      PT LONG TERM GOAL #3   Title Pt will improve bilateral hip extension and abduction strength by at least 1/2 MMT grade to promote ability to perform standing tasks with less back pain.    Baseline Hip extension 4/5 R, and L, hip abduction 4/5 R,  4-/5 L (06/17/2021); hip extension 5/5 R, 5/5 L, hip abduction 4/5 R, 4/5 L (08/13/2021)    Time 8    Period Weeks    Status Partially Met    Target Date 09/18/21              Plan - 08/26/21 1417     Clinical Impression Statement Good response to previous session with no low back starting pain reported today. Continued working on improving posture, trunk and glute strength as well as decreasing lumbar paraspinal muscle tension to decrease stress to low back. Pt tolerated session well without aggravation of symptoms. Pt will benefit from continued skilled physical therapy services to decrease pain, improve strength and function.    Personal Factors and Comorbidities Comorbidity 3+;Past/Current Experience;Time since onset of injury/illness/exacerbation    Comorbidities Depression, HTN, lumbar surgery    Examination-Activity Limitations Lift;Stand;Bend;Sit    Stability/Clinical Decision Making Stable/Uncomplicated    Rehab Potential Fair    PT Frequency 2x / week    PT Duration 4 weeks    PT Treatment/Interventions Therapeutic activities;Therapeutic exercise;Neuromuscular re-education;Manual techniques;Dry needling;Aquatic Therapy;Electrical Stimulation;Iontophoresis 7m/ml Dexamethasone;Traction;Moist Heat;Functional mobility training;Patient/family education    PT Next Visit Plan posture, scapular, anterior cervical, trunk, hip strengthening, manual techniques, modalities PRN    PT Home Exercise Plan Medbridge Access  [facility]   Consulted and Agree with Plan of Care Patient                 MJoneen BoersPT, DPT   08/26/2021, 3:02 PM

## 2021-09-03 ENCOUNTER — Ambulatory Visit: Payer: Medicare Other

## 2021-09-03 DIAGNOSIS — M545 Low back pain, unspecified: Secondary | ICD-10-CM

## 2021-09-03 DIAGNOSIS — M5416 Radiculopathy, lumbar region: Secondary | ICD-10-CM

## 2021-09-03 DIAGNOSIS — M6281 Muscle weakness (generalized): Secondary | ICD-10-CM

## 2021-09-03 NOTE — Therapy (Signed)
OUTPATIENT PHYSICAL THERAPY TREATMENT NOTE   Patient Name: Kristin Mcdowell MRN: [id number] DOB:1960-07-17, 61 y.o., female Today's Date: 09/03/2021  PCP:  Marguerita Merles, MD REFERRING PROVIDER:  Meeler, Sherren Kerns, FNP    PT End of Session - 09/03/21 1630     Visit Number 14    Number of Visits 25    Date for PT Re-Evaluation 09/23/21    Authorization Type 4    Authorization Time Period of 10 progress report    PT Start Time 1631    PT Stop Time 1711    PT Time Calculation (min) 40 min    Activity Tolerance Patient tolerated treatment well    Behavior During Therapy WFL for tasks assessed/performed                 Past Medical History:  Diagnosis Date   Depression    GERD (gastroesophageal reflux disease)    Hypertension    Seasonal allergies    Past Surgical History:  Procedure Laterality Date   ABDOMINAL HYSTERECTOMY     COLONOSCOPY WITH PROPOFOL N/A 08/30/2015   Procedure: COLONOSCOPY WITH PROPOFOL;  Surgeon: Lucilla Lame, MD;  Location: [place];  Service: Endoscopy;  Laterality: N/A;   COLONOSCOPY WITH PROPOFOL N/A 06/14/2020   Procedure: COLONOSCOPY WITH PROPOFOL;  Surgeon: [name], MD;  Location: [facility] ENDOSCOPY;  Service: Gastroenterology;  Laterality: N/A;   COLONOSCOPY WITH PROPOFOL N/A 07/22/2020   Procedure: COLONOSCOPY WITH PROPOFOL;  Surgeon: [name], MD;  Location: [facility] ENDOSCOPY;  Service: Gastroenterology;  Laterality: N/A;   ESOPHAGOGASTRODUODENOSCOPY (EGD) WITH PROPOFOL N/A 07/22/2020   Procedure: ESOPHAGOGASTRODUODENOSCOPY (EGD) WITH PROPOFOL;  Surgeon: [name], MD;  Location: [facility] ENDOSCOPY;  Service: Gastroenterology;  Laterality: N/A;   LUMBAR DISC SURGERY     Patient Active Problem List   Diagnosis Date Noted   Dysphagia 07/10/2020   Colon cancer screening    Gastro-esophageal reflux disease without esophagitis 01/25/2015   Other fatigue 01/25/2015   Allergic rhinitis 01/31/2014   Essential (primary) hypertension  01/31/2014    REFERRING DIAG: Lumbar DDD, HNP, radiculitis, stenosis with neurogenic claudication, back muscle spasm  THERAPY DIAG:  Chronic bilateral low back pain, unspecified whether sciatica present  Muscle weakness (generalized)  Radiculopathy, lumbar region  PERTINENT HISTORY: Back pain. Was doing good after last session in December 2022 until her last MD appointment. Got a steriod/cortisone shot in her back 05/16/2021 which might have helped. No B LE paresthesia. No loss of bowel or bladder control. Back is not throbbing like it used to. Joined a gym recently. Pain has gotten a little better since December 2022.  PRECAUTIONS: No known precautions  SUBJECTIVE: Back is about a 4/10 today, not too bad.     PAIN:  Are you having pain? Yes: NPRS scale: 4/10 Pain location: low back      TODAY'S TREATMENT:  Objective    Medbridge Access Code [facility]     September 25, 2021 = 4 weeks from now    Therapeutic exercise   Seated trunk rotation 10x3 each direction  Standing GENTLE L lateral shift correction, gentle 10x5 seconds for 3 sets  Seated manually resisted R lateral shift isometrics in neutral 10x3 with 5 second holds    Seated transversus abdominis contraction 10x5 seconds for 3 sets  Side stepping, yellow band around knees 32 ft to the L and 32 ft to the R for 2 sets  Standing B UE assist   Hip extension with yellow band around knees  R 10x3   L 10x3       Improved exercise technique, movement at target joints, use of target muscles after mod verbal, visual, tactile cues.       Manual therapy  Seated STM to lumbar paraspinal muscle to decrease muscle tension and fascial restrictions         Response to treatment Pt tolerated session well without aggravation of symptoms.      Clinical impression Continued working on improving posture, trunk and glute strength as well as decreasing lumbar paraspinal muscle tension to decrease stress to low  back. Improved low back comfort level reported after session today. Pt will benefit from continued skilled physical therapy services to decrease pain, improve strength and function.         PATIENT EDUCATION: Education details: there-ex Person educated: Patient Education method: Explanation, Demonstration, Corporate treasurer cues, and Verbal cues Education comprehension: verbalized understanding and returned demonstration   HOME EXERCISE PROGRAM: Access Code: [email or web address] URL: [email or web address] Date: 07/01/2021 Prepared by: Joneen Boers  Exercises - Supine Scapular Retraction  - 1 x daily - 7 x weekly - 3 sets - 10 reps - 5 seconds hold - Supine Transversus Abdominis Bracing - Hands on Stomach  - 1 x daily - 7 x weekly - 3 sets - 10 reps - 5 seconds hold - Left Standing Lateral Shift Correction at [employer]  - 1 x daily - 7 x weekly - 3 sets - 10 reps - 5 seconds hold - Supine Lower Trunk Rotation  - 1 x daily - 7 x weekly - 3 sets - 10 reps    PT Short Term Goals - 08/13/21 1735       PT SHORT TERM GOAL #1   Title Pt will be independent with her initial HEP to decrease pain, improve strength and function.    Baseline Pt started her HEP (06/17/2021); Pt performing her HEP. No questions so far (08/13/2021)    Time 3    Period Weeks    Status Achieved    Target Date 07/10/21              PT Long Term Goals - 08/19/21 1636       PT LONG TERM GOAL #1   Title Pt will have a decrease in low back pain to 4/10 or less at worst to promote ability to perform tasks which involve lifting as well as be able to tolerate prolonged sitting and standing.    Baseline 9/10 low back pain at worst for the past 2 months (06/17/2021); 4/10 (08/13/2021)    Time 8    Period Weeks    Status Partially Met    Target Date 09/18/21      PT LONG TERM GOAL #2   Title Pt will improve her lumbar spine FOTO by at least 10 points as a demonstration of improved function.    Baseline Lumbar  spine FOTO 54 (3/114/2023); 45 (08/13/2021)    Time 8    Period Weeks    Status On-going    Target Date 09/18/21      PT LONG TERM GOAL #3   Title Pt will improve bilateral hip extension and abduction strength by at least 1/2 MMT grade to promote ability to perform standing tasks with less back pain.    Baseline Hip extension 4/5 R, and L, hip abduction 4/5 R, 4-/5 L (06/17/2021); hip extension 5/5 R, 5/5 L, hip abduction 4/5 R, 4/5 L (08/13/2021)    Time  8    Period Weeks    Status Partially Met    Target Date 09/18/21              Plan - 09/03/21 1630     Clinical Impression Statement Continued working on improving posture, trunk and glute strength as well as decreasing lumbar paraspinal muscle tension to decrease stress to low back. Improved low back comfort level reported after session today. Pt will benefit from continued skilled physical therapy services to decrease pain, improve strength and function.    Personal Factors and Comorbidities Comorbidity 3+;Past/Current Experience;Time since onset of injury/illness/exacerbation    Comorbidities Depression, HTN, lumbar surgery    Examination-Activity Limitations Lift;Stand;Bend;Sit    Stability/Clinical Decision Making Stable/Uncomplicated    Clinical Decision Making Low    Rehab Potential Fair    PT Frequency 2x / week    PT Duration 4 weeks    PT Treatment/Interventions Therapeutic activities;Therapeutic exercise;Neuromuscular re-education;Manual techniques;Dry needling;Aquatic Therapy;Electrical Stimulation;Iontophoresis 42m/ml Dexamethasone;Traction;Moist Heat;Functional mobility training;Patient/family education    PT Next Visit Plan posture, scapular, anterior cervical, trunk, hip strengthening, manual techniques, modalities PRN    PT Home Exercise Plan [facility]   Consulted and Agree with Plan of Care Patient                  MJoneen BoersPT, DPT   09/03/2021, 5:14 PM

## 2021-09-08 ENCOUNTER — Ambulatory Visit: Payer: Medicare Other | Attending: Family Medicine

## 2021-09-08 DIAGNOSIS — M5412 Radiculopathy, cervical region: Secondary | ICD-10-CM | POA: Insufficient documentation

## 2021-09-08 DIAGNOSIS — M545 Low back pain, unspecified: Secondary | ICD-10-CM | POA: Diagnosis present

## 2021-09-08 DIAGNOSIS — M5416 Radiculopathy, lumbar region: Secondary | ICD-10-CM | POA: Diagnosis present

## 2021-09-08 DIAGNOSIS — M6281 Muscle weakness (generalized): Secondary | ICD-10-CM | POA: Insufficient documentation

## 2021-09-08 DIAGNOSIS — G8929 Other chronic pain: Secondary | ICD-10-CM | POA: Diagnosis present

## 2021-09-08 DIAGNOSIS — M542 Cervicalgia: Secondary | ICD-10-CM | POA: Insufficient documentation

## 2021-09-08 NOTE — Patient Instructions (Signed)
  Lying on your recliner or bed, knees bent (hips less than 90 degrees flexion)   Belt around thighs, which are shoulder width apart,    Press knees out against belt with 40 % effort for 1 minute   Rest for 1-2 minutes   Perform 5 times per set.     Do 3 sets per day. Each set to be performed a few hours apart.

## 2021-09-08 NOTE — Therapy (Signed)
OUTPATIENT PHYSICAL THERAPY TREATMENT NOTE   Patient Name: Kristin Mcdowell MRN: [id number] DOB:08-May-1960, 61 y.o., female Today's Date: 09/08/2021  PCP:  Marguerita Merles, MD REFERRING PROVIDER:  Meeler, Sherren Kerns, FNP    PT End of Session - 09/08/21 1142     Visit Number 15    Number of Visits 25    Date for PT Re-Evaluation 09/23/21    Authorization Type 5    Authorization Time Period of 10 progress report    PT Start Time 1142    PT Stop Time 1220    PT Time Calculation (min) 38 min    Activity Tolerance Patient tolerated treatment well    Behavior During Therapy WFL for tasks assessed/performed                  Past Medical History:  Diagnosis Date   Depression    GERD (gastroesophageal reflux disease)    Hypertension    Seasonal allergies    Past Surgical History:  Procedure Laterality Date   ABDOMINAL HYSTERECTOMY     COLONOSCOPY WITH PROPOFOL N/A 08/30/2015   Procedure: COLONOSCOPY WITH PROPOFOL;  Surgeon: Lucilla Lame, MD;  Location: [place];  Service: Endoscopy;  Laterality: N/A;   COLONOSCOPY WITH PROPOFOL N/A 06/14/2020   Procedure: COLONOSCOPY WITH PROPOFOL;  Surgeon: [name], MD;  Location: [facility] ENDOSCOPY;  Service: Gastroenterology;  Laterality: N/A;   COLONOSCOPY WITH PROPOFOL N/A 07/22/2020   Procedure: COLONOSCOPY WITH PROPOFOL;  Surgeon: [name], MD;  Location: [facility] ENDOSCOPY;  Service: Gastroenterology;  Laterality: N/A;   ESOPHAGOGASTRODUODENOSCOPY (EGD) WITH PROPOFOL N/A 07/22/2020   Procedure: ESOPHAGOGASTRODUODENOSCOPY (EGD) WITH PROPOFOL;  Surgeon: [name], MD;  Location: [facility] ENDOSCOPY;  Service: Gastroenterology;  Laterality: N/A;   LUMBAR DISC SURGERY     Patient Active Problem List   Diagnosis Date Noted   Dysphagia 07/10/2020   Colon cancer screening    Gastro-esophageal reflux disease without esophagitis 01/25/2015   Other fatigue 01/25/2015   Allergic rhinitis 01/31/2014   Essential (primary) hypertension  01/31/2014    REFERRING DIAG: Lumbar DDD, HNP, radiculitis, stenosis with neurogenic claudication, back muscle spasm  THERAPY DIAG:  Chronic bilateral low back pain, unspecified whether sciatica present  Muscle weakness (generalized)  Radiculopathy, lumbar region  PERTINENT HISTORY: Back pain. Was doing good after last session in December 2022 until her last MD appointment. Got a steriod/cortisone shot in her back 05/16/2021 which might have helped. No B LE paresthesia. No loss of bowel or bladder control. Back is not throbbing like it used to. Joined a gym recently. Pain has gotten a little better since December 2022.  PRECAUTIONS: No known precautions  SUBJECTIVE: Back was sore during the weekend. A little whiny with her back, about a 3/10 currently. Was a 4/10 this weekend. Thinks that she shot is wearing off. Has L hip pain, feels worse when she lays on her L side.     PAIN:  Are you having pain? Yes: NPRS scale: 3/10 Pain location: low back      TODAY'S TREATMENT:  Objective    Medbridge Access Code [facility]     September 25, 2021 = 4 weeks from now    Therapeutic exercise   Reclined position with moist heat pack:   [place] isometrics at 40% effort 1 minute with 1 min rest breaks for 5x   Lower trunk rotation 10x3 each direction   Posterior pelvic tilt 10x5 seconds for 3 sets   Hip extension isometrics with leg straight  R 10x5 seconds    L 10x5 seconds    Seated manually resisted L upper trunk rotation isometrics to promote more neutral lumbar posture 10x5 seconds for 3 sets           Improved exercise technique, movement at target joints, use of target muscles after mod verbal, visual, tactile cues.         Response to treatment Pt tolerated session well without aggravation of symptoms. Decreased Back symptoms. Still reports L hip and thigh symptoms.      Clinical impression TTP L greater trochanter and L glute med muscle. Worked on  isometric glute med contraction to promote proper stress to affected areas for healing. Continued working on decreasing lumbar paraspinal muscle tension, improving trunk mobility, trunk and glute strength, posture to decrease stress to low back. Decreased low back discomfort reported after session. Still reports L hip and thigh symptoms. Pt will benefit from continued skilled physical therapy services to decrease pain, improve strength and function.         PATIENT EDUCATION: Education details: there-ex Person educated: Patient Education method: Explanation, Demonstration, Corporate treasurer cues, and Verbal cues Education comprehension: verbalized understanding and returned demonstration   HOME EXERCISE PROGRAM: Access Code: [email or web address] URL: [email or web address] Date: 07/01/2021 Prepared by: Joneen Boers  Exercises - Supine Scapular Retraction  - 1 x daily - 7 x weekly - 3 sets - 10 reps - 5 seconds hold - Supine Transversus Abdominis Bracing - Hands on Stomach  - 1 x daily - 7 x weekly - 3 sets - 10 reps - 5 seconds hold - Left Standing Lateral Shift Correction at [employer]  - 1 x daily - 7 x weekly - 3 sets - 10 reps - 5 seconds hold - Supine Lower Trunk Rotation  - 1 x daily - 7 x weekly - 3 sets - 10 reps    PT Short Term Goals - 08/13/21 1735       PT SHORT TERM GOAL #1   Title Pt will be independent with her initial HEP to decrease pain, improve strength and function.    Baseline Pt started her HEP (06/17/2021); Pt performing her HEP. No questions so far (08/13/2021)    Time 3    Period Weeks    Status Achieved    Target Date 07/10/21              PT Long Term Goals - 08/19/21 1636       PT LONG TERM GOAL #1   Title Pt will have a decrease in low back pain to 4/10 or less at worst to promote ability to perform tasks which involve lifting as well as be able to tolerate prolonged sitting and standing.    Baseline 9/10 low back pain at worst for the past 2  months (06/17/2021); 4/10 (08/13/2021)    Time 8    Period Weeks    Status Partially Met    Target Date 09/18/21      PT LONG TERM GOAL #2   Title Pt will improve her lumbar spine FOTO by at least 10 points as a demonstration of improved function.    Baseline Lumbar spine FOTO 54 (3/114/2023); 45 (08/13/2021)    Time 8    Period Weeks    Status On-going    Target Date 09/18/21      PT LONG TERM GOAL #3   Title Pt will improve bilateral hip extension and abduction strength by at least 1/2 MMT grade  to promote ability to perform standing tasks with less back pain.    Baseline Hip extension 4/5 R, and L, hip abduction 4/5 R, 4-/5 L (06/17/2021); hip extension 5/5 R, 5/5 L, hip abduction 4/5 R, 4/5 L (08/13/2021)    Time 8    Period Weeks    Status Partially Met    Target Date 09/18/21              Plan - 09/08/21 1141     Clinical Impression Statement TTP L greater trochanter and L glute med muscle. Worked on isometric glute med contraction to promote proper stress to affected areas for healing. Continued working on decreasing lumbar paraspinal muscle tension, improving trunk mobility, trunk and glute strength, posture to decrease stress to low back. Decreased low back discomfort reported after session. Still reports L hip and thigh symptoms. Pt will benefit from continued skilled physical therapy services to decrease pain, improve strength and function.    Personal Factors and Comorbidities Comorbidity 3+;Past/Current Experience;Time since onset of injury/illness/exacerbation    Comorbidities Depression, HTN, lumbar surgery    Examination-Activity Limitations Lift;Stand;Bend;Sit    Stability/Clinical Decision Making Stable/Uncomplicated    Rehab Potential Fair    PT Frequency 2x / week    PT Duration 4 weeks    PT Treatment/Interventions Therapeutic activities;Therapeutic exercise;Neuromuscular re-education;Manual techniques;Dry needling;Aquatic Therapy;Electrical  Stimulation;Iontophoresis 49m/ml Dexamethasone;Traction;Moist Heat;Functional mobility training;Patient/family education    PT Next Visit Plan posture, scapular, anterior cervical, trunk, hip strengthening, manual techniques, modalities PRN    PT Home Exercise Plan [facility]bridge [facility]   Consulted and Agree with Plan of Care Patient                   MJoneen BoersPT, DPT   09/08/2021, 12:37 PM

## 2021-09-10 ENCOUNTER — Ambulatory Visit: Payer: Medicare Other

## 2021-09-10 DIAGNOSIS — M6281 Muscle weakness (generalized): Secondary | ICD-10-CM

## 2021-09-10 DIAGNOSIS — M5416 Radiculopathy, lumbar region: Secondary | ICD-10-CM

## 2021-09-10 DIAGNOSIS — G8929 Other chronic pain: Secondary | ICD-10-CM

## 2021-09-10 DIAGNOSIS — M545 Low back pain, unspecified: Secondary | ICD-10-CM | POA: Diagnosis not present

## 2021-09-10 NOTE — Therapy (Signed)
OUTPATIENT PHYSICAL THERAPY TREATMENT NOTE   Patient Name: Kristin Mcdowell MRN: [id number] DOB:1960/12/25, 61 y.o., female Today's Date: 09/10/2021  PCP:  Marguerita Merles, MD REFERRING PROVIDER:  Meeler, Sherren Kerns, FNP    PT End of Session - 09/10/21 1505     Visit Number 16    Number of Visits 25    Date for PT Re-Evaluation 09/23/21    Authorization Type 6    Authorization Time Period of 10 progress report    PT Start Time 1505    PT Stop Time 1549    PT Time Calculation (min) 44 min    Activity Tolerance Patient tolerated treatment well    Behavior During Therapy WFL for tasks assessed/performed                   Past Medical History:  Diagnosis Date   Depression    GERD (gastroesophageal reflux disease)    Hypertension    Seasonal allergies    Past Surgical History:  Procedure Laterality Date   ABDOMINAL HYSTERECTOMY     COLONOSCOPY WITH PROPOFOL N/A 08/30/2015   Procedure: COLONOSCOPY WITH PROPOFOL;  Surgeon: Lucilla Lame, MD;  Location: [place];  Service: Endoscopy;  Laterality: N/A;   COLONOSCOPY WITH PROPOFOL N/A 06/14/2020   Procedure: COLONOSCOPY WITH PROPOFOL;  Surgeon: [name], MD;  Location: [facility] ENDOSCOPY;  Service: Gastroenterology;  Laterality: N/A;   COLONOSCOPY WITH PROPOFOL N/A 07/22/2020   Procedure: COLONOSCOPY WITH PROPOFOL;  Surgeon: [name], MD;  Location: [facility] ENDOSCOPY;  Service: Gastroenterology;  Laterality: N/A;   ESOPHAGOGASTRODUODENOSCOPY (EGD) WITH PROPOFOL N/A 07/22/2020   Procedure: ESOPHAGOGASTRODUODENOSCOPY (EGD) WITH PROPOFOL;  Surgeon: [name], MD;  Location: [facility] ENDOSCOPY;  Service: Gastroenterology;  Laterality: N/A;   LUMBAR DISC SURGERY     Patient Active Problem List   Diagnosis Date Noted   Dysphagia 07/10/2020   Colon cancer screening    Gastro-esophageal reflux disease without esophagitis 01/25/2015   Other fatigue 01/25/2015   Allergic rhinitis 01/31/2014   Essential (primary) hypertension  01/31/2014    REFERRING DIAG: Lumbar DDD, HNP, radiculitis, stenosis with neurogenic claudication, back muscle spasm  THERAPY DIAG:  Chronic bilateral low back pain, unspecified whether sciatica present  Muscle weakness (generalized)  Radiculopathy, lumbar region  PERTINENT HISTORY: Back pain. Was doing good after last session in December 2022 until her last MD appointment. Got a steriod/cortisone shot in her back 05/16/2021 which might have helped. No B LE paresthesia. No loss of bowel or bladder control. Back is not throbbing like it used to. Joined a gym recently. Pain has gotten a little better since December 2022.  PRECAUTIONS: No known precautions  SUBJECTIVE: Trying to figure out how to use her home TENS unit. Wants to review it with PT. Back is whining but not hurting as bad due to the weather. The hip felt better after last session. Can sleep on her L hip now.       PAIN:  Are you having pain? Yes: NPRS scale: 5/10 Pain location: low back      TODAY'S TREATMENT:  Objective    Medbridge Access Code [facility]     September 25, 2021 = 4 weeks from now    Therapeutic exercise   Reclined position with moist heat pack:   Lower trunk rotation 10x3 each direction   Posterior pelvic tilt 10x5 seconds for 3 sets   Hip extension isometrics with leg straight   R 10x5 seconds    L 10x5 seconds  Seated manually resisted L upper trunk rotation isometrics to promote more neutral lumbar posture 10x5 seconds for 2 sets    Improved exercise technique, movement at target joints, use of target muscles after mod verbal, visual, tactile cues.     Electrical stimulation Reviewed use of pt home TENS unit with pt per her request ( [place]) (With TENS on L low back lumbar paraspinal muscles and L posterior hip, channel 1) Pt was also recommended to monitor skin after using.  Pt demonstrates and verbalized understanding.   Feels good per pt. Continuous  setting preferred by pt.             Response to treatment Pt tolerated session well without aggravation of symptoms.       Clinical impression Continued working on decreasing lumbar paraspinal muscle tension, improving trunk mobility, trunk and glute strength, and posture to decrease stress to low back. Utilized pt TENS unit while reviewing how to use it per pt request. Pt demonstrated and verbalized understanding of how to use it afterwards (continuous setting preferred by pt). Improved L thigh pain compared to previous session based on subjective reports. Pt tolerated session well without aggravation of symptoms. Pt will benefit from continued skilled physical therapy services to decrease pain, improve strength and function.         PATIENT EDUCATION: Education details: there-ex Person educated: Patient Education method: Explanation, Demonstration, Corporate treasurer cues, and Verbal cues Education comprehension: verbalized understanding and returned demonstration   HOME EXERCISE PROGRAM: Access Code: [email or web address] URL: [email or web address] Date: 07/01/2021 Prepared by: Joneen Boers  Exercises - Supine Scapular Retraction  - 1 x daily - 7 x weekly - 3 sets - 10 reps - 5 seconds hold - Supine Transversus Abdominis Bracing - Hands on Stomach  - 1 x daily - 7 x weekly - 3 sets - 10 reps - 5 seconds hold - Left Standing Lateral Shift Correction at [place]  - 1 x daily - 7 x weekly - 3 sets - 10 reps - 5 seconds hold - Supine Lower Trunk Rotation  - 1 x daily - 7 x weekly - 3 sets - 10 reps    PT Short Term Goals - 08/13/21 1735       PT SHORT TERM GOAL #1   Title Pt will be independent with her initial HEP to decrease pain, improve strength and function.    Baseline Pt started her HEP (06/17/2021); Pt performing her HEP. No questions so far (08/13/2021)    Time 3    Period Weeks    Status Achieved    Target Date 07/10/21              PT Long Term Goals -  08/19/21 1636       PT LONG TERM GOAL #1   Title Pt will have a decrease in low back pain to 4/10 or less at worst to promote ability to perform tasks which involve lifting as well as be able to tolerate prolonged sitting and standing.    Baseline 9/10 low back pain at worst for the past 2 months (06/17/2021); 4/10 (08/13/2021)    Time 8    Period Weeks    Status Partially Met    Target Date 09/18/21      PT LONG TERM GOAL #2   Title Pt will improve her lumbar spine FOTO by at least 10 points as a demonstration of improved function.    Baseline Lumbar spine FOTO 54 (  3/114/2023); 45 (08/13/2021)    Time 8    Period Weeks    Status On-going    Target Date 09/18/21      PT LONG TERM GOAL #3   Title Pt will improve bilateral hip extension and abduction strength by at least 1/2 MMT grade to promote ability to perform standing tasks with less back pain.    Baseline Hip extension 4/5 R, and L, hip abduction 4/5 R, 4-/5 L (06/17/2021); hip extension 5/5 R, 5/5 L, hip abduction 4/5 R, 4/5 L (08/13/2021)    Time 8    Period Weeks    Status Partially Met    Target Date 09/18/21              Plan - 09/10/21 1650     Clinical Impression Statement Continued working on decreasing lumbar paraspinal muscle tension, improving trunk mobility, trunk and glute strength, and posture to decrease stress to low back. Utilized pt TENS unit while reviewing how to use it per pt request. Pt demonstrated and verbalized understanding of how to use it afterwards (continuous setting preferred by pt). Improved L thigh pain compared to previous session based on subjective reports. Pt tolerated session well without aggravation of symptoms. Pt will benefit from continued skilled physical therapy services to decrease pain, improve strength and function.    Personal Factors and Comorbidities Comorbidity 3+;Past/Current Experience;Time since onset of injury/illness/exacerbation    Comorbidities Depression, HTN, lumbar  surgery    Examination-Activity Limitations Lift;Stand;Bend;Sit    Stability/Clinical Decision Making Stable/Uncomplicated    Clinical Decision Making Low    Rehab Potential Fair    PT Frequency 2x / week    PT Duration 4 weeks    PT Treatment/Interventions Therapeutic activities;Therapeutic exercise;Neuromuscular re-education;Manual techniques;Dry needling;Aquatic Therapy;Electrical Stimulation;Iontophoresis 81m/ml Dexamethasone;Traction;Moist Heat;Functional mobility training;Patient/family education    PT Next Visit Plan posture, scapular, anterior cervical, trunk, hip strengthening, manual techniques, modalities PRN    PT Home Exercise Plan Medbridge [facility]   Consulted and Agree with Plan of Care Patient                    MJoneen BoersPT, DPT   09/10/2021, 4:58 PM

## 2021-09-11 ENCOUNTER — Encounter: Payer: Self-pay | Admitting: Family Medicine

## 2021-09-17 ENCOUNTER — Ambulatory Visit: Payer: Medicare Other

## 2021-09-17 DIAGNOSIS — G8929 Other chronic pain: Secondary | ICD-10-CM

## 2021-09-17 DIAGNOSIS — M545 Low back pain, unspecified: Secondary | ICD-10-CM | POA: Diagnosis not present

## 2021-09-17 DIAGNOSIS — M5416 Radiculopathy, lumbar region: Secondary | ICD-10-CM

## 2021-09-17 DIAGNOSIS — M6281 Muscle weakness (generalized): Secondary | ICD-10-CM

## 2021-09-17 NOTE — Therapy (Signed)
OUTPATIENT PHYSICAL THERAPY TREATMENT NOTE   Patient Name: Kristin Mcdowell MRN: [id number] DOB:02-26-1961, 61 y.o., female Today's Date: 09/17/2021  PCP:  Marguerita Merles, MD REFERRING PROVIDER:  Meeler, Sherren Kerns, FNP    PT End of Session - 09/17/21 1102     Visit Number 17    Number of Visits 25    Date for PT Re-Evaluation 09/23/21    Authorization Type 7    Authorization Time Period of 10 progress report    PT Start Time 1102    PT Stop Time 1142    PT Time Calculation (min) 40 min    Activity Tolerance Patient tolerated treatment well    Behavior During Therapy WFL for tasks assessed/performed                   Past Medical History:  Diagnosis Date   Depression    GERD (gastroesophageal reflux disease)    Hypertension    Seasonal allergies    Past Surgical History:  Procedure Laterality Date   ABDOMINAL HYSTERECTOMY     COLONOSCOPY WITH PROPOFOL N/A 08/30/2015   Procedure: COLONOSCOPY WITH PROPOFOL;  Surgeon: Lucilla Lame, MD;  Location: [place];  Service: Endoscopy;  Laterality: N/A;   COLONOSCOPY WITH PROPOFOL N/A 06/14/2020   Procedure: COLONOSCOPY WITH PROPOFOL;  Surgeon: [name], MD;  Location: [facility] ENDOSCOPY;  Service: Gastroenterology;  Laterality: N/A;   COLONOSCOPY WITH PROPOFOL N/A 07/22/2020   Procedure: COLONOSCOPY WITH PROPOFOL;  Surgeon: [name], MD;  Location[facility] ENDOSCOPY;  Service: Gastroenterology;  Laterality: N/A;   ESOPHAGOGASTRODUODENOSCOPY (EGD) WITH PROPOFOL N/A 07/22/2020   Procedure: ESOPHAGOGASTRODUODENOSCOPY (EGD) WITH PROPOFOL;  Surgeon: [name], MD;  Location: [facility] ENDOSCOPY;  Service: Gastroenterology;  Laterality: N/A;   LUMBAR DISC SURGERY     Patient Active Problem List   Diagnosis Date Noted   Dysphagia 07/10/2020   Colon cancer screening    Gastro-esophageal reflux disease without esophagitis 01/25/2015   Other fatigue 01/25/2015   Allergic rhinitis 01/31/2014   Essential (primary)  hypertension 01/31/2014    REFERRING DIAG: Lumbar DDD, HNP, radiculitis, stenosis with neurogenic claudication, back muscle spasm  THERAPY DIAG:  Chronic bilateral low back pain, unspecified whether sciatica present  Muscle weakness (generalized)  Radiculopathy, lumbar region  PERTINENT HISTORY: Back pain. Was doing good after last session in December 2022 until her last MD appointment. Got a steriod/cortisone shot in her back 05/16/2021 which might have helped. No B LE paresthesia. No loss of bowel or bladder control. Back is not throbbing like it used to. Joined a gym recently. Pain has gotten a little better since December 2022.  PRECAUTIONS: No known precautions  SUBJECTIVE: Back feels ok right now. Getting up in the morning is ok, but laying down at night is when it bothers her. During the day moving around is fine.        PAIN:  Are you having pain? Yes: NPRS scale: 0/10 No pain, just whining.  Pain location: low back      TODAY'S TREATMENT:  Objective    Medbridge Access Code [facility]     September 25, 2021 = 4 weeks from now    Therapeutic exercise   Reclined position with moist heat pack:   Posterior pelvic tilt 10x5 seconds for 3 sets    Lower trunk rotation 10x3 each direction    Hip extension isometrics with leg straight   R 10x5 seconds for 2 sets   L 10x5 seconds for 2 sets  Improved exercise technique, movement at target joints, use of target muscles after mod verbal, visual, tactile cues.      Manual therapy  Seated STM to lumbar paraspinal muscle to decrease muscle tension and fascial restrictions            Response to treatment Fair tolerance to today's session.       Clinical impression Improved overall back pain with pt reports of her back now just mainly bothering her at night when she is in bed and based on no starting low back pain today. Continued working on decreasing lumbar paraspinal muscle tension, improving trunk  mobility, trunk and glute strength, and posture to decrease stress to low back. Fair tolerance to today's session. Pt will benefit from continued skilled physical therapy services to decrease pain, improve strength and function.         PATIENT EDUCATION: Education details: there-ex Person educated: Patient Education method: Explanation, Demonstration, Corporate treasurer cues, and Verbal cues Education comprehension: verbalized understanding and returned demonstration   HOME EXERCISE PROGRAM: Access Code: [email or web address] URL: [email or web address] Date: 07/01/2021 Prepared by: Joneen Boers  Exercises - Supine Scapular Retraction  - 1 x daily - 7 x weekly - 3 sets - 10 reps - 5 seconds hold - Supine Transversus Abdominis Bracing - Hands on Stomach  - 1 x daily - 7 x weekly - 3 sets - 10 reps - 5 seconds hold - Left Standing Lateral Shift Correction at [place]  - 1 x daily - 7 x weekly - 3 sets - 10 reps - 5 seconds hold - Supine Lower Trunk Rotation  - 1 x daily - 7 x weekly - 3 sets - 10 reps    PT Short Term Goals - 08/13/21 1735       PT SHORT TERM GOAL #1   Title Pt will be independent with her initial HEP to decrease pain, improve strength and function.    Baseline Pt started her HEP (06/17/2021); Pt performing her HEP. No questions so far (08/13/2021)    Time 3    Period Weeks    Status Achieved    Target Date 07/10/21              PT Long Term Goals - 08/19/21 1636       PT LONG TERM GOAL #1   Title Pt will have a decrease in low back pain to 4/10 or less at worst to promote ability to perform tasks which involve lifting as well as be able to tolerate prolonged sitting and standing.    Baseline 9/10 low back pain at worst for the past 2 months (06/17/2021); 4/10 (08/13/2021)    Time 8    Period Weeks    Status Partially Met    Target Date 09/18/21      PT LONG TERM GOAL #2   Title Pt will improve her lumbar spine FOTO by at least 10 points as a demonstration  of improved function.    Baseline Lumbar spine FOTO 54 (3/114/2023); 45 (08/13/2021)    Time 8    Period Weeks    Status On-going    Target Date 09/18/21      PT LONG TERM GOAL #3   Title Pt will improve bilateral hip extension and abduction strength by at least 1/2 MMT grade to promote ability to perform standing tasks with less back pain.    Baseline Hip extension 4/5 R, and L, hip abduction 4/5 R, 4-/5 L (06/17/2021); hip extension  5/5 R, 5/5 L, hip abduction 4/5 R, 4/5 L (08/13/2021)    Time 8    Period Weeks    Status Partially Met    Target Date 09/18/21              Plan - 09/17/21 1103     Clinical Impression Statement Improved overall back pain with pt reports of her back now just mainly bothering her at night when she is in bed and based on no starting low back pain today. Continued working on decreasing lumbar paraspinal muscle tension, improving trunk mobility, trunk and glute strength, and posture to decrease stress to low back. Fair tolerance to today's session. Pt will benefit from continued skilled physical therapy services to decrease pain, improve strength and function.    Personal Factors and Comorbidities Comorbidity 3+;Past/Current Experience;Time since onset of injury/illness/exacerbation    Comorbidities Depression, HTN, lumbar surgery    Examination-Activity Limitations Lift;Stand;Bend;Sit    Stability/Clinical Decision Making Stable/Uncomplicated    Clinical Decision Making Low    Rehab Potential Fair    PT Frequency 2x / week    PT Duration 4 weeks    PT Treatment/Interventions Therapeutic activities;Therapeutic exercise;Neuromuscular re-education;Manual techniques;Dry needling;Aquatic Therapy;Electrical Stimulation;Iontophoresis 42m/ml Dexamethasone;Traction;Moist Heat;Functional mobility training;Patient/family education    PT Next Visit Plan posture, scapular, anterior cervical, trunk, hip strengthening, manual techniques, modalities PRN    PT Home Exercise  Plan Medbridge Access [facility]   Consulted and Agree with Plan of Care Patient                     MJoneen BoersPT, DPT   09/17/2021, 11:50 AM

## 2021-09-23 ENCOUNTER — Ambulatory Visit: Payer: Medicare Other

## 2021-09-25 ENCOUNTER — Ambulatory Visit: Payer: Medicare Other

## 2021-09-25 DIAGNOSIS — M6281 Muscle weakness (generalized): Secondary | ICD-10-CM

## 2021-09-25 DIAGNOSIS — M5412 Radiculopathy, cervical region: Secondary | ICD-10-CM

## 2021-09-25 DIAGNOSIS — M542 Cervicalgia: Secondary | ICD-10-CM

## 2021-09-25 DIAGNOSIS — M545 Low back pain, unspecified: Secondary | ICD-10-CM | POA: Diagnosis not present

## 2021-09-25 DIAGNOSIS — M5416 Radiculopathy, lumbar region: Secondary | ICD-10-CM

## 2021-09-25 NOTE — Therapy (Signed)
OUTPATIENT PHYSICAL THERAPY TREATMENT NOTE Re certification (for today's visit) And Discharge Summary   Patient Name: Kristin Mcdowell MRN: [id number] DOB:1960/07/26, 61 y.o., female Today's Date: 09/25/2021  PCP:  Marguerita Merles, MD REFERRING PROVIDER:  Meeler, Sherren Kerns, FNP    PT End of Session - 09/25/21 1333     Visit Number 18    Number of Visits 25    Date for PT Re-Evaluation 09/25/21    Authorization Type 8    Authorization Time Period of 10 progress report    PT Start Time 1333    PT Stop Time 1402    PT Time Calculation (min) 29 min    Activity Tolerance Patient tolerated treatment well    Behavior During Therapy WFL for tasks assessed/performed                    Past Medical History:  Diagnosis Date   Depression    GERD (gastroesophageal reflux disease)    Hypertension    Seasonal allergies    Past Surgical History:  Procedure Laterality Date   ABDOMINAL HYSTERECTOMY     COLONOSCOPY WITH PROPOFOL N/A 08/30/2015   Procedure: COLONOSCOPY WITH PROPOFOL;  Surgeon: Lucilla Lame, MD;  Location: [place];  Service: Endoscopy;  Laterality: N/A;   COLONOSCOPY WITH PROPOFOL N/A 06/14/2020   Procedure: COLONOSCOPY WITH PROPOFOL;  Surgeon: [name], MD;  Location: [facility] ENDOSCOPY;  Service: Gastroenterology;  Laterality: N/A;   COLONOSCOPY WITH PROPOFOL N/A 07/22/2020   Procedure: COLONOSCOPY WITH PROPOFOL;  Surgeon: [name], MD;  Location: [facility] ENDOSCOPY;  Service: Gastroenterology;  Laterality: N/A;   ESOPHAGOGASTRODUODENOSCOPY (EGD) WITH PROPOFOL N/A 07/22/2020   Procedure: ESOPHAGOGASTRODUODENOSCOPY (EGD) WITH PROPOFOL;  Surgeon: [name], MD;  Location: [facility] ENDOSCOPY;  Service: Gastroenterology;  Laterality: N/A;   LUMBAR DISC SURGERY     Patient Active Problem List   Diagnosis Date Noted   Dysphagia 07/10/2020   Colon cancer screening    Gastro-esophageal reflux disease without esophagitis 01/25/2015   Other fatigue 01/25/2015    Allergic rhinitis 01/31/2014   Essential (primary) hypertension 01/31/2014    REFERRING DIAG: Lumbar DDD, HNP, radiculitis, stenosis with neurogenic claudication, back muscle spasm  THERAPY DIAG:  Chronic bilateral low back pain, unspecified whether sciatica present  Muscle weakness (generalized)  Radiculopathy, lumbar region  PERTINENT HISTORY: Back pain. Was doing good after last session in December 2022 until her last MD appointment. Got a steriod/cortisone shot in her back 05/16/2021 which might have helped. No B LE paresthesia. No loss of bowel or bladder control. Back is not throbbing like it used to. Joined a gym recently. Pain has gotten a little better since December 2022.  PRECAUTIONS: No known precautions  SUBJECTIVE:  Back has been whining after being in the car to drive up to [place] but manageable.      PAIN:  Are you having pain? Yes: NPRS scale: 0/10 No pain, just whining.  Pain location: low back      TODAY'S TREATMENT:  Objective    Medbridge Access Code [email or web address]        Therapeutic exercise   Seated manually resisted hip extension, S/L hip abduction 1-2x each way for each LE  Reviewed progress/current status with PT towards goals.   Seated trunk rotation 10x2 each direction  Seated transversus abdominis contraction 10x5 seconds for 2 sets      Improved exercise technique, movement at target joints, use of target muscles after min verbal, visual, tactile cues.  Manual therapy Seated STM to lumbar paraspinal muscle to decrease muscle tension and fascial restrictions            Response to treatment Pt tolerated session well without aggravation of symptoms.       Clinical impression Pt demonstrates overall improved low back pain, improved B hip strength and function since initial evaluation. Pt has made progress with PT towards goals. Skilled physical therapy services discharged after re certification for today's session with pt  continuing with her exercises at home.            PATIENT EDUCATION: Education details: there-ex Person educated: Patient Education method: Explanation, Demonstration, Corporate treasurer cues, and Verbal cues Education comprehension: verbalized understanding and returned demonstration   HOME EXERCISE PROGRAM: Access Code: [email or web address] URL: [email or web address] Date: 07/01/2021 Prepared by: Joneen Boers  Exercises - Supine Scapular Retraction  - 1 x daily - 7 x weekly - 3 sets - 10 reps - 5 seconds hold - Supine Transversus Abdominis Bracing - Hands on Stomach  - 1 x daily - 7 x weekly - 3 sets - 10 reps - 5 seconds hold - Left Standing Lateral Shift Correction at [place]  - 1 x daily - 7 x weekly - 3 sets - 10 reps - 5 seconds hold - Supine Lower Trunk Rotation  - 1 x daily - 7 x weekly - 3 sets - 10 reps    PT Short Term Goals - 08/13/21 1735       PT SHORT TERM GOAL #1   Title Pt will be independent with her initial HEP to decrease pain, improve strength and function.    Baseline Pt started her HEP (06/17/2021); Pt performing her HEP. No questions so far (08/13/2021)    Time 3    Period Weeks    Status Achieved    Target Date 07/10/21              PT Long Term Goals - 09/25/21 1336       PT LONG TERM GOAL #1   Title Pt will have a decrease in low back pain to 4/10 or less at worst to promote ability to perform tasks which involve lifting as well as be able to tolerate prolonged sitting and standing.    Baseline 9/10 low back pain at worst for the past 2 months (06/17/2021); 4/10 (08/13/2021); 5/10 at most for the past 7 days (09/25/2021)    Time 8    Period Weeks    Status Partially Met    Target Date 09/18/21      PT LONG TERM GOAL #2   Title Pt will improve her lumbar spine FOTO by at least 10 points as a demonstration of improved function.    Baseline Lumbar spine FOTO 54 (3/114/2023); 45 (08/13/2021); 65 (09/25/2021)    Time 8    Period Weeks     Status Achieved    Target Date 09/18/21      PT LONG TERM GOAL #3   Title Pt will improve bilateral hip extension and abduction strength by at least 1/2 MMT grade to promote ability to perform standing tasks with less back pain.    Baseline Hip extension 4/5 R, and L, hip abduction 4/5 R, 4-/5 L (06/17/2021); hip extension 5/5 R, 5/5 L, hip abduction 4/5 R, 4/5 L (08/13/2021); hip extension 5/5 R and L, hip abduction 4+/5 R, 5/5 L (09/25/2021)    Time 8    Period Weeks    Status  Achieved    Target Date 09/18/21              Plan - 09/25/21 1409     Clinical Impression Statement Pt demonstrates overall improved low back pain, improved B hip strength and function since initial evaluation. Pt has made progress with PT towards goals. Skilled physical therapy services discharged after re certification for today's session with pt continuing with her exercises at home.    Personal Factors and Comorbidities Comorbidity 3+;Past/Current Experience;Time since onset of injury/illness/exacerbation    Comorbidities Depression, HTN, lumbar surgery    Examination-Activity Limitations Lift;Stand;Bend;Sit    Stability/Clinical Decision Making Stable/Uncomplicated    Clinical Decision Making Low    Rehab Potential Fair    PT Frequency One time visit    PT Duration --    PT Treatment/Interventions Therapeutic activities;Therapeutic exercise;Neuromuscular re-education;Manual techniques;Patient/family education    PT Next Visit Plan Continue progress with exercises at home.    PT Home Exercise Plan Medbridge Access Code [facility]    Consulted and Agree with Plan of Care Patient                      Joneen Boers PT, DPT   09/25/2021, 2:17 PM

## 2022-01-19 ENCOUNTER — Ambulatory Visit (INDEPENDENT_AMBULATORY_CARE_PROVIDER_SITE_OTHER): Payer: Medicare Other | Admitting: Gastroenterology

## 2022-01-19 ENCOUNTER — Ambulatory Visit: Payer: Medicare Other | Admitting: Gastroenterology

## 2022-01-19 VITALS — BP 117/78 | HR 93 | Temp 98.0°F | Ht 66.0 in | Wt 178.0 lb

## 2022-01-19 DIAGNOSIS — K5900 Constipation, unspecified: Secondary | ICD-10-CM

## 2022-01-19 MED ORDER — LUBIPROSTONE 24 MCG PO CAPS: 24.0000 ug | ORAL_CAPSULE | Freq: Two times a day (BID) | ORAL | 2 refills | Status: AC

## 2022-01-19 NOTE — Progress Notes (Signed)
Wyline Mood MD, MRCP(U.K) [address]  [address]  [place], [place] [address]  Main: [phone]  Fax: [phone]   Primary Care Physician: Leanna Sato, MD  Primary Gastroenterologist:  Dr. Wyline Mood   Chief Complaint  Patient presents with   Follow-up    HPI: KRYSTINA STRIETER is a 61 y.o. female   Summary of history :   Deviously being followed for constipation.  Had responded to Trulance.   06/14/2020: Colonoscopy: Stool is seen in the rectum sigmoid colon and descending colon hence procedure was inadequate.  Repeat colonoscopy on 07/22/2020 showed no abnormalities. 07/22/2020 EGD: Normal appearance biopsies taken to rule out EOE and a colonoscopy was performed for constipation and showed no abnormalities biopsies of the esophagus showed no abnormalities.    Interval history 10/16/2020-01/19/2022   She has previously had difficulty obtaining Trulance due to her insurance.  At her last visit give her a prescription of Linzess she was taking it says did not work very well and cause abdominal pain.  She was taking 290 mcg of Linzess.  She does not want to try it again.  No other issues presently.  Main issue is constipation can go up to a week without a bowel movement     Current Outpatient Medications  Medication Sig Dispense Refill   acetaminophen (TYLENOL) 325 MG tablet Take 325 mg by mouth as needed.     amLODipine-atorvastatin (CADUET) 5-10 MG tablet Take by mouth.     COD LIVER OIL PO Take by mouth daily.     cyclobenzaprine (FLEXERIL) 5 MG tablet Take 1-2 tablets (5-10 mg total) by mouth 3 (three) times daily as needed for muscle spasms. 20 tablet 0   docusate sodium (COLACE) 100 MG capsule Take 100 mg by mouth 2 (two) times daily as needed for mild constipation.     HYDROcodone-acetaminophen (NORCO) 5-325 MG tablet Take 1 tablet by mouth every 4 (four) hours as needed for moderate pain. 15 tablet 0   lidocaine (LIDODERM) 5 % Place 1 patch onto the skin  daily as needed. Remove & Discard patch within 12 hours or as directed by MD     linaclotide (LINZESS) 290 MCG CAPS capsule Take 1 capsule (290 mcg total) by mouth daily before breakfast. 30 capsule 3   Multiple Vitamin (MULTIVITAMIN) capsule Take 1 capsule by mouth daily.     omeprazole (PRILOSEC) 20 MG capsule Take 20 mg by mouth daily.     predniSONE (DELTASONE) 10 MG tablet Take 1 tablet (10 mg total) by mouth daily. 6,5,4,3,2,1 six day taper 21 tablet 0   No current facility-administered medications for this visit.    Allergies as of 01/19/2022 - Review Complete 01/19/2022  Allergen Reaction Noted   Penicillins  09/23/2012    ROS:  General: Negative for anorexia, weight loss, fever, chills, fatigue, weakness. ENT: Negative for hoarseness, difficulty swallowing , nasal congestion. CV: Negative for chest pain, angina, palpitations, dyspnea on exertion, peripheral edema.  Respiratory: Negative for dyspnea at rest, dyspnea on exertion, cough, sputum, wheezing.  GI: See history of present illness. GU:  Negative for dysuria, hematuria, urinary incontinence, urinary frequency, nocturnal urination.  Endo: Negative for unusual weight change.    Physical Examination:   BP 117/78   Pulse 93   Temp 98 F (36.7 C) (Oral)   Ht 5\' 6"  (1.676 m)   Wt 178 lb (80.7 kg)   BMI 28.73 kg/m   General: Well-nourished, well-developed in no acute distress.  Eyes: No icterus. Conjunctivae pink. Neuro: Alert and oriented x 3.  Grossly intact. Skin: Warm and dry, no jaundice.   Psych: Alert and cooperative, normal mood and affect.   Imaging Studies: No results found.  Assessment and Plan:   IMOGENE GRAVELLE is a 61 y.o. y/o female previously seen in follow-up for constipation.  Unable to obtain Trulance due to insurance issues.  Linzess 290 mcg did not work well.  We will try Amitiza 24 mcg twice daily trial of 2 weeks she will call if it works for prescription otherwise options include  lactulose or ttenapanor   Dr [name]  MD,MRCP [facility]) Follow up in as needed

## 2022-01-28 ENCOUNTER — Other Ambulatory Visit: Payer: Self-pay | Admitting: Family Medicine

## 2022-01-28 DIAGNOSIS — Z1231 Encounter for screening mammogram for malignant neoplasm of breast: Secondary | ICD-10-CM

## 2022-03-10 ENCOUNTER — Ambulatory Visit
Admission: RE | Admit: 2022-03-10 | Discharge: 2022-03-10 | Disposition: A | Discharge status: Home / Self Care | Payer: Medicare Other | Source: Ambulatory Visit | Attending: Family Medicine | Admitting: Family Medicine

## 2022-03-10 DIAGNOSIS — Z1231 Encounter for screening mammogram for malignant neoplasm of breast: Secondary | ICD-10-CM | POA: Insufficient documentation

## 2022-03-16 ENCOUNTER — Other Ambulatory Visit: Payer: Self-pay | Admitting: Family Medicine

## 2022-03-16 DIAGNOSIS — R928 Other abnormal and inconclusive findings on diagnostic imaging of breast: Secondary | ICD-10-CM

## 2022-04-01 ENCOUNTER — Ambulatory Visit
Admission: RE | Admit: 2022-04-01 | Discharge: 2022-04-01 | Disposition: A | Discharge status: Home / Self Care | Payer: Medicare Other | Source: Ambulatory Visit | Attending: Family Medicine | Admitting: Family Medicine

## 2022-04-01 DIAGNOSIS — R928 Other abnormal and inconclusive findings on diagnostic imaging of breast: Secondary | ICD-10-CM | POA: Diagnosis not present

## 2022-06-16 ENCOUNTER — Other Ambulatory Visit: Payer: Self-pay | Admitting: Family Medicine

## 2022-06-16 DIAGNOSIS — Z780 Asymptomatic menopausal state: Secondary | ICD-10-CM

## 2022-08-03 ENCOUNTER — Ambulatory Visit
Admission: RE | Admit: 2022-08-03 | Discharge: 2022-08-03 | Disposition: A | Discharge status: Home / Self Care | Payer: 59 | Source: Ambulatory Visit | Attending: Family Medicine | Admitting: Family Medicine

## 2022-08-03 DIAGNOSIS — Z780 Asymptomatic menopausal state: Secondary | ICD-10-CM | POA: Insufficient documentation

## 2022-08-10 ENCOUNTER — Ambulatory Visit
Admission: RE | Admit: 2022-08-10 | Discharge: 2022-08-10 | Disposition: A | Discharge status: Home / Self Care | Payer: 59 | Source: Ambulatory Visit | Attending: Physician Assistant | Admitting: Physician Assistant

## 2022-08-10 ENCOUNTER — Other Ambulatory Visit: Payer: Self-pay | Admitting: Physician Assistant

## 2022-08-10 DIAGNOSIS — M25562 Pain in left knee: Secondary | ICD-10-CM

## 2022-08-20 ENCOUNTER — Emergency Department
Admission: EM | Admit: 2022-08-20 | Discharge: 2022-08-20 | Disposition: A | Discharge status: Home / Self Care | Payer: 59 | Attending: Emergency Medicine | Admitting: Emergency Medicine

## 2022-08-20 ENCOUNTER — Emergency Department: Payer: 59

## 2022-08-20 ENCOUNTER — Encounter: Payer: Self-pay | Admitting: Emergency Medicine

## 2022-08-20 ENCOUNTER — Other Ambulatory Visit: Payer: Self-pay

## 2022-08-20 DIAGNOSIS — M79605 Pain in left leg: Secondary | ICD-10-CM | POA: Diagnosis present

## 2022-08-20 DIAGNOSIS — M7122 Synovial cyst of popliteal space [Baker], left knee: Secondary | ICD-10-CM

## 2022-08-20 MED ORDER — HYDROCODONE-ACETAMINOPHEN 5-325 MG PO TABS: 1.0000 | ORAL_TABLET | Freq: Four times a day (QID) | ORAL | 0 refills | Status: AC | PRN

## 2022-08-20 NOTE — Discharge Instructions (Signed)
Keep your appointment with [facility].  A prescription for pain medication was sent to the pharmacy to take every 6 hours if needed.  Be aware that this medication could cause drowsiness and increase your risk for falling.  Do not drive or operate machinery while taking this medication.  The hinged knee brace also will give you more support.  If before you are appointment you feel that you need to be seen [facility] you also has a walk-in clinic where you can see your provider also.

## 2022-08-20 NOTE — ED Triage Notes (Signed)
Pt comes with c/o left leg pain to knee and behind it. Pt states it feels like there is heat to it. Pt states swelling and heaviness in leg. Pt denies any injuries. Pt states this all started two weeks ago.

## 2022-08-20 NOTE — ED Provider Notes (Signed)
[facility] Provider Note    Event Date/Time   First MD Initiated Contact with Patient 08/20/22 [phone]     (approximate)   History   Leg Pain   HPI  Kristin Mcdowell is a 62 y.o. female presents to the ED with complaint of left leg pain.  Patient states that her left knee has been hurting for some time and has been seen at [facility] and has an appointment next month for reevaluation.  Patient also had a ultrasound the first week in May that ruled out a DVT causing her problems.     Physical Exam   Triage Vital Signs: ED Triage Vitals  Enc Vitals Group     BP 08/20/22 0839 (!) 141/100     Pulse Rate 08/20/22 0839 84     Resp 08/20/22 0839 18     Temp 08/20/22 0839 98 F (36.7 C)     Temp src --      SpO2 08/20/22 0839 97 %     Weight 08/20/22 0846 177 lb 14.6 oz (80.7 kg)     Height 08/20/22 0846 5\' 6"  (1.676 m)     Head Circumference --      Peak Flow --      Pain Score 08/20/22 0839 7     Pain Loc --      Pain Edu? --      Excl. in GC? --     Most recent vital signs: Vitals:   08/20/22 0839 08/20/22 1115  BP: (!) 141/100 124/89  Pulse: 84 71  Resp: 18 18  Temp: 98 F (36.7 C)   SpO2: 97% 99%     General: Awake, no distress.  CV:  Good peripheral perfusion.  Resp:  Normal effort.  Abd:  No distention.  Other:  On examination of left lower extremity there is some mild effusion noted about the left knee.  Tenderness noted on the medial aspect.  Range of motion is slow and with some crepitus noted.  Skin is intact.  There is tenderness on palpation of the posterior aspect and concerning for possible Baker's cyst.  No edema or erythema is noted to the lower extremity.  Pulses are present both DP and PT.   ED Results / Procedures / Treatments   Labs (all labs ordered are listed, but only abnormal results are displayed) Labs Reviewed - No data to display   RADIOLOGY  Left knee x-ray images were reviewed by myself and dependent  of the radiologist and was negative for fracture or dislocation.  There is some effusion present with some minimal degenerative changes.   PROCEDURES:  Critical Care performed:   Procedures   MEDICATIONS ORDERED IN ED: Medications - No data to display   IMPRESSION / MDM / ASSESSMENT AND PLAN / ED COURSE  I reviewed the triage vital signs and the nursing notes.   Differential diagnosis includes, but is not limited to, left knee pain, left knee strain, muscle skeletal pain, Baker's cyst, DVT, cellulitis.  62 year old female presents to the ED with complaint of left knee pain without recent history of injury.  Patient was seen initially at [facility] and has a follow-up appointment in a couple weeks.  Patient was started on anti-inflammatories which she discontinued after reading the side effects.  Ultrasound left extremity performed on 08/10/2022 reviewed and noted to have a reading for a Baker's cyst.  X-ray images were discussed with patient and a hinged knee brace was applied.  She is encouraged to keep her appointment with [facility].  A prescription for hydrocodone was sent to the pharmacy for her to take as needed for pain and I encouraged her to consider taking the anti-inflammatory.      Patient's presentation is most consistent with acute complicated illness / injury requiring diagnostic workup.  FINAL CLINICAL IMPRESSION(S) / ED DIAGNOSES   Final diagnoses:  Baker's cyst of knee, left     Rx / DC Orders   ED Discharge Orders          Ordered    HYDROcodone-acetaminophen (NORCO/VICODIN) 5-325 MG tablet  Every 6 hours PRN        08/20/22 1056             Note:  This document was prepared using Dragon voice recognition software and may include unintentional dictation errors.   Tommi Rumps, PA-C 08/20/22 1223    Merwyn Katos, MD 08/20/22 [phone]

## 2022-08-21 ENCOUNTER — Other Ambulatory Visit: Payer: Self-pay | Admitting: Orthopedic Surgery

## 2022-08-21 DIAGNOSIS — S83242A Other tear of medial meniscus, current injury, left knee, initial encounter: Secondary | ICD-10-CM

## 2022-08-25 ENCOUNTER — Ambulatory Visit
Admission: RE | Admit: 2022-08-25 | Discharge: 2022-08-25 | Disposition: A | Discharge status: Home / Self Care | Payer: 59 | Source: Ambulatory Visit | Attending: Orthopedic Surgery | Admitting: Orthopedic Surgery

## 2022-08-25 DIAGNOSIS — S83242A Other tear of medial meniscus, current injury, left knee, initial encounter: Secondary | ICD-10-CM | POA: Insufficient documentation

## 2022-12-11 IMAGING — MG MM DIGITAL SCREENING BILAT W/ TOMO AND CAD
6 of 12 series · 6 of 36 positions shown · non-contrast
Comparison: Previous exam(s).

CLINICAL DATA: Screening.

EXAM:
DIGITAL SCREENING BILATERAL MAMMOGRAM WITH TOMOSYNTHESIS AND CAD
TECHNIQUE: Bilateral screening digital craniocaudal and mediolateral oblique
mammograms were obtained. Bilateral screening digital breast
tomosynthesis was performed. The images were evaluated with
computer-aided detection.

[R MLO synth-2D (1 of 2)]
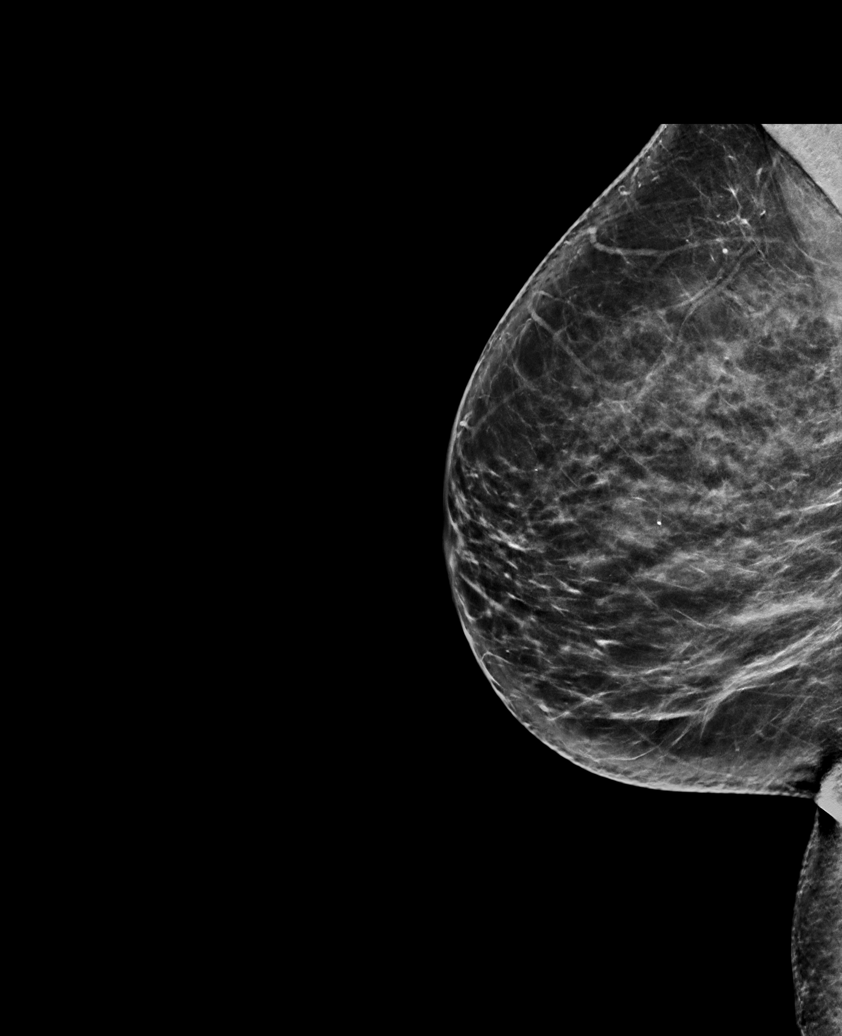

[L CC synth-2D]
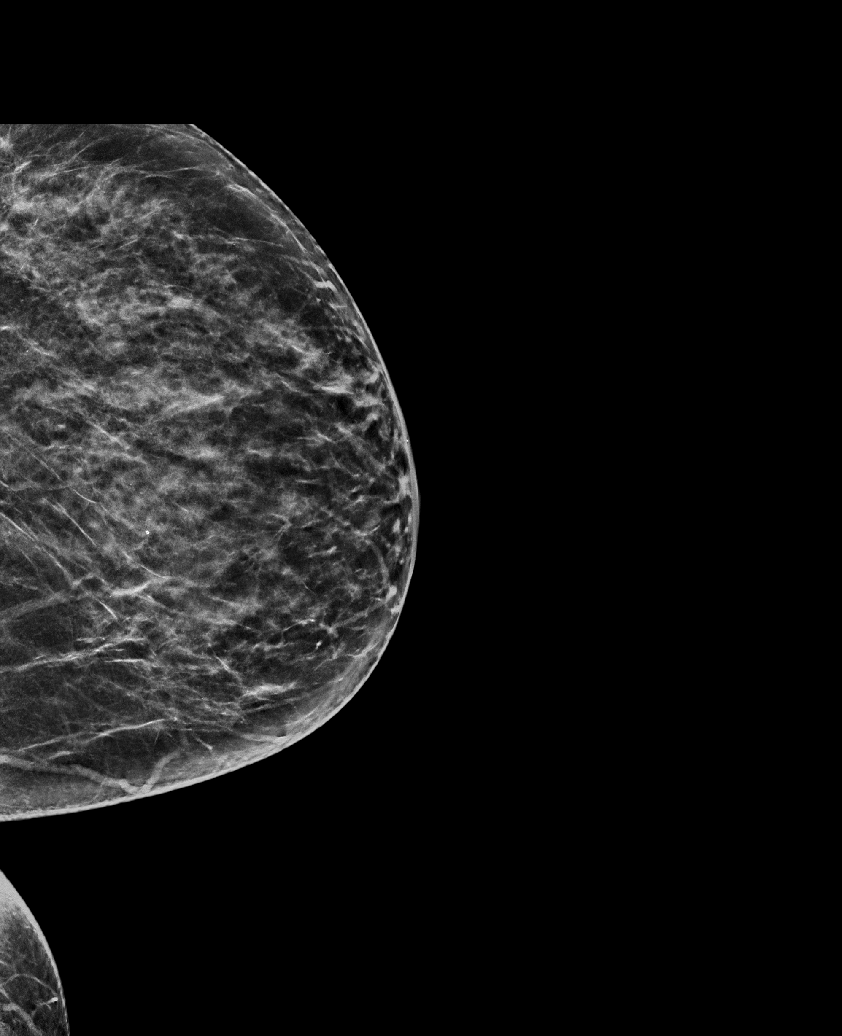

[R MLO synth-2D (2 of 2)]
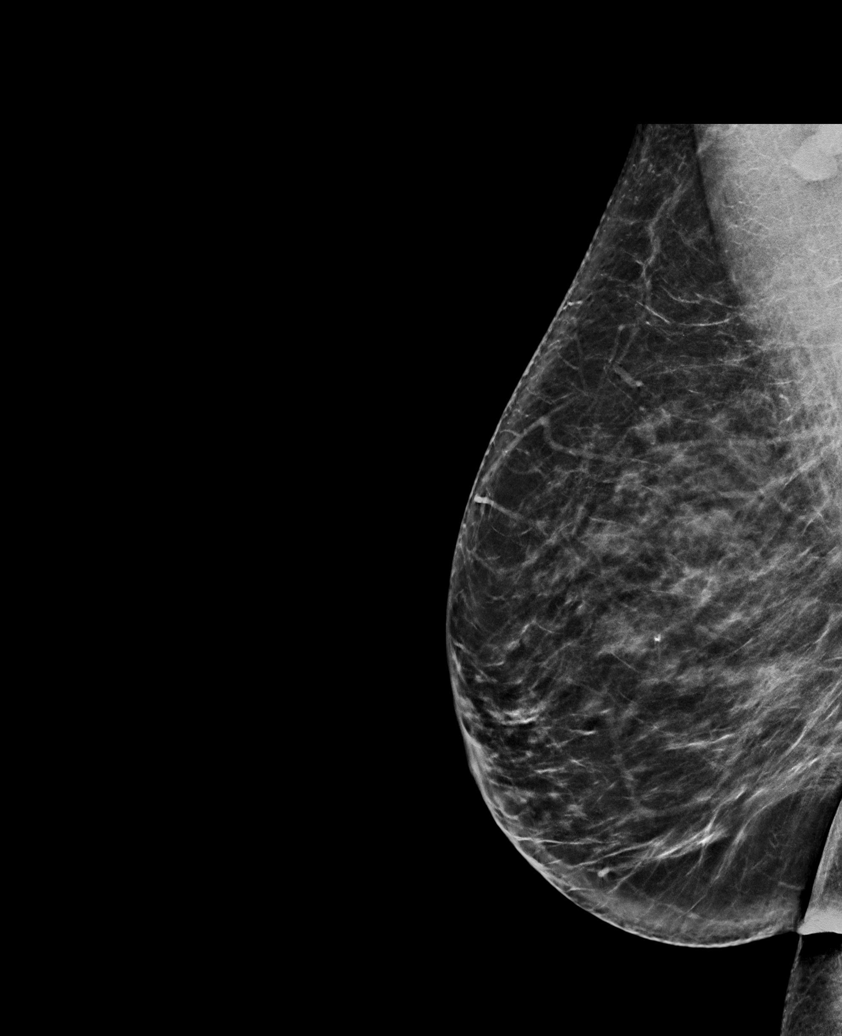

[R CC synth-2D]
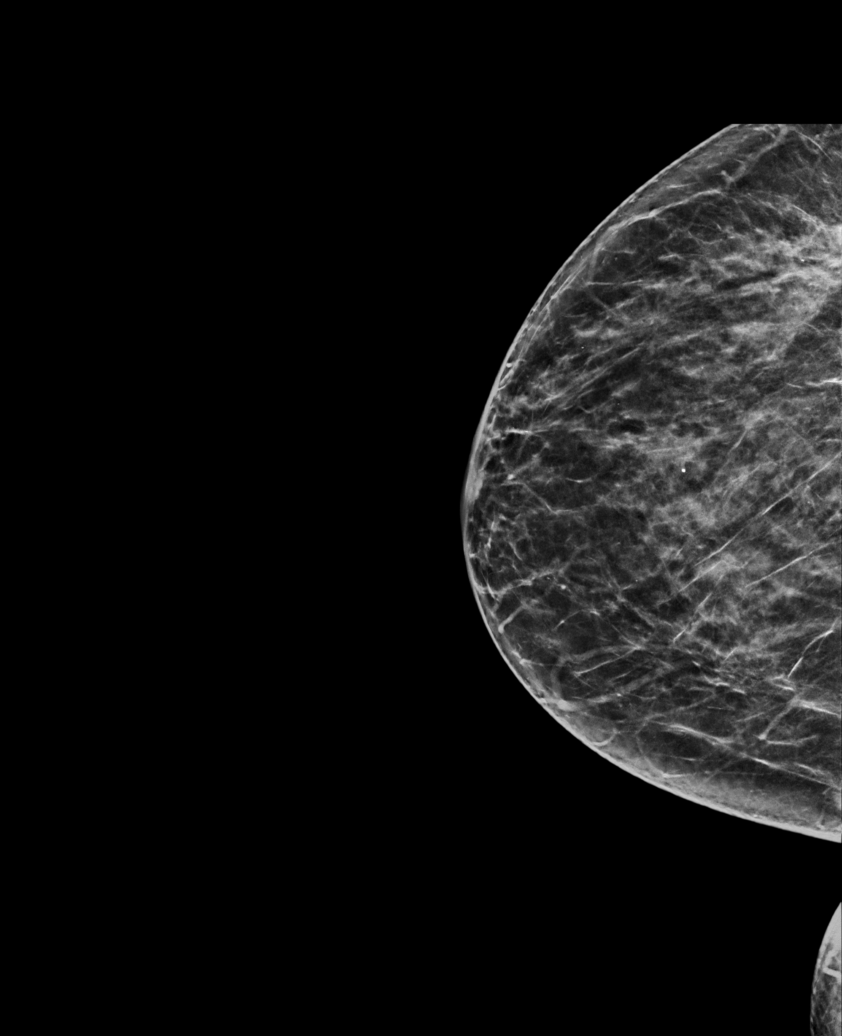

[L MLO synth-2D]
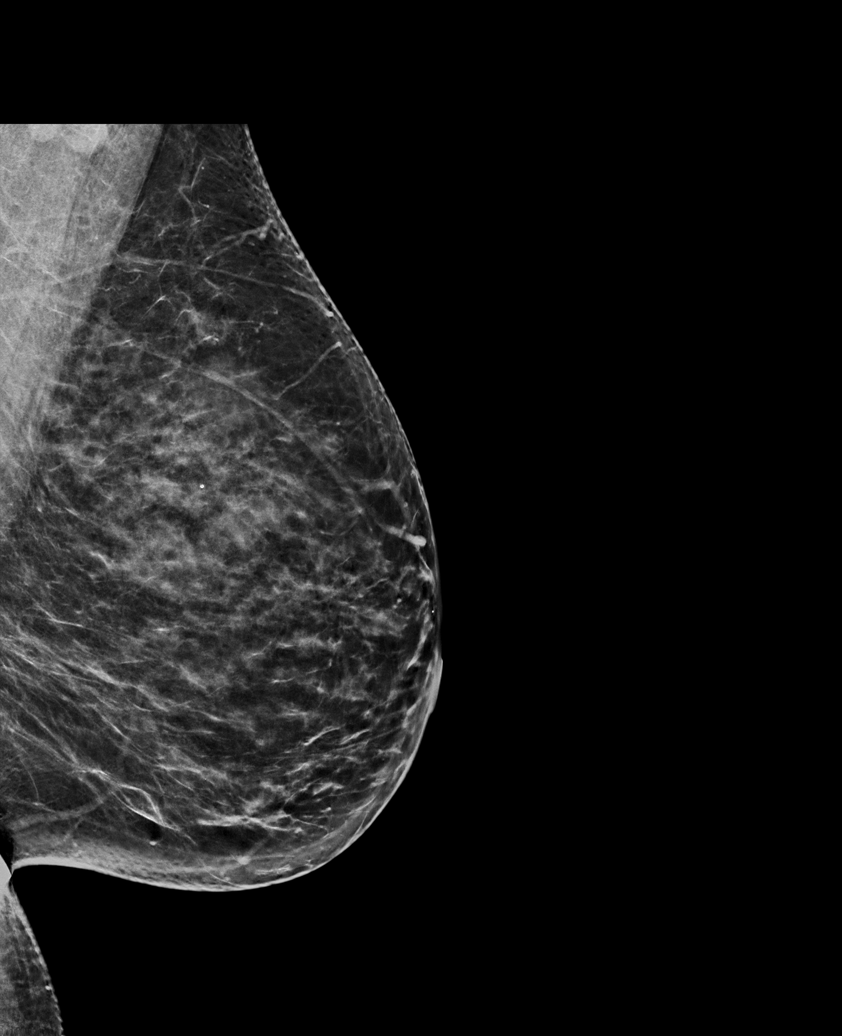

[R XCCL synth-2D]
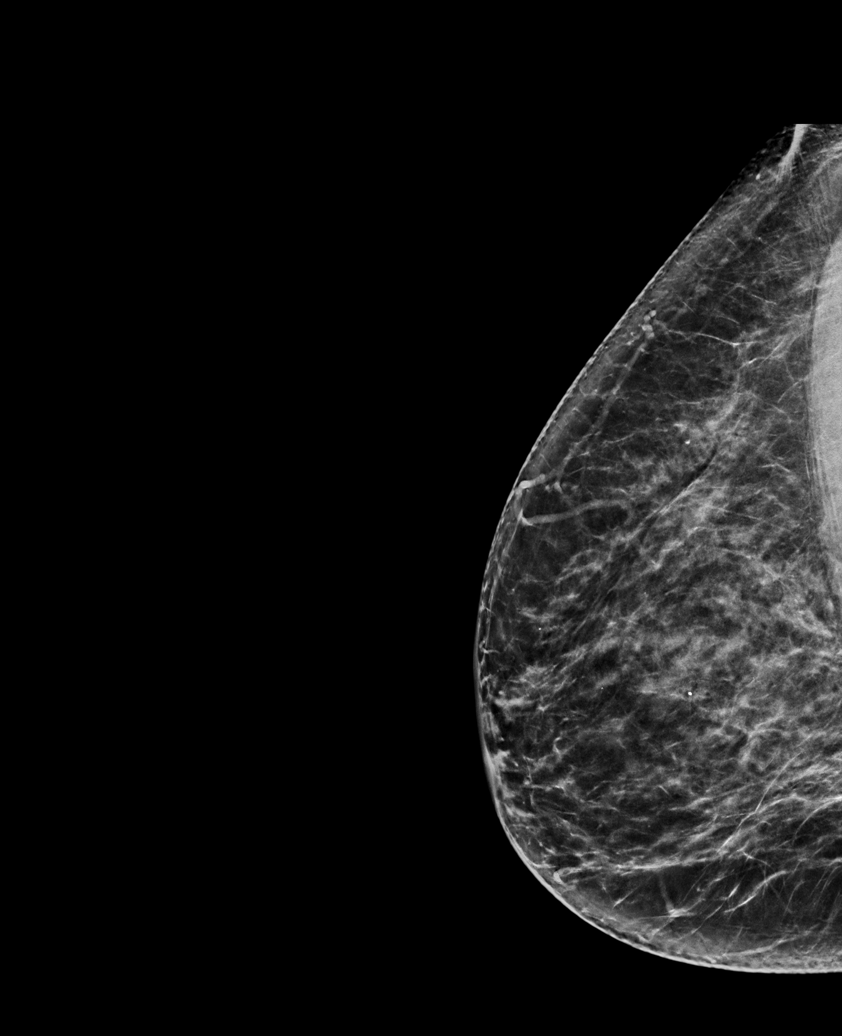

[6 of 36 positions shown; findings below may reference images not displayed]

ACR Breast Density Category c: The breast tissue is heterogeneously
dense, which may obscure small masses.
FINDINGS: There are no findings suspicious for malignancy.
IMPRESSION: No mammographic evidence of malignancy. A result letter of this
screening mammogram will be mailed directly to the patient.

RECOMMENDATION:
Screening mammogram in one year. (Code:[id number])

BI-RADS CATEGORY  1: Negative.

## 2023-03-24 ENCOUNTER — Other Ambulatory Visit: Payer: Self-pay | Admitting: Family Medicine

## 2023-03-24 DIAGNOSIS — Z1231 Encounter for screening mammogram for malignant neoplasm of breast: Secondary | ICD-10-CM

## 2023-04-08 ENCOUNTER — Ambulatory Visit
Admission: RE | Admit: 2023-04-08 | Discharge: 2023-04-08 | Disposition: A | Discharge status: Home / Self Care | Payer: 59 | Source: Ambulatory Visit | Attending: Family Medicine | Admitting: Family Medicine

## 2023-04-08 DIAGNOSIS — Z1231 Encounter for screening mammogram for malignant neoplasm of breast: Secondary | ICD-10-CM | POA: Insufficient documentation

## 2023-06-23 ENCOUNTER — Encounter: Payer: Self-pay | Admitting: Dermatology

## 2023-06-23 ENCOUNTER — Ambulatory Visit (INDEPENDENT_AMBULATORY_CARE_PROVIDER_SITE_OTHER): Payer: 59 | Admitting: Dermatology

## 2023-06-23 DIAGNOSIS — L280 Lichen simplex chronicus: Secondary | ICD-10-CM | POA: Diagnosis not present

## 2023-06-23 DIAGNOSIS — L281 Prurigo nodularis: Secondary | ICD-10-CM | POA: Diagnosis not present

## 2023-06-23 DIAGNOSIS — L720 Epidermal cyst: Secondary | ICD-10-CM

## 2023-06-23 DIAGNOSIS — L649 Androgenic alopecia, unspecified: Secondary | ICD-10-CM

## 2023-06-23 DIAGNOSIS — D492 Neoplasm of unspecified behavior of bone, soft tissue, and skin: Secondary | ICD-10-CM

## 2023-06-23 DIAGNOSIS — L658 Other specified nonscarring hair loss: Secondary | ICD-10-CM

## 2023-06-23 NOTE — Patient Instructions (Addendum)
 Acquanetta Sit, MD, FAAD [address] [place], [place] [address]  P: [phone]     Due to recent changes in healthcare laws, you may see results of your pathology and/or laboratory studies on MyChart before the doctors have had a chance to review them. We understand that in some cases there may be results that are confusing or concerning to you. Please understand that not all results are received at the same time and often the doctors may need to interpret multiple results in order to provide you with the best plan of care or course of treatment. Therefore, we ask that you please give [place] 2 business days to thoroughly review all your results before contacting the office for clarification. Should we see a critical lab result, you will be contacted sooner.   If You Need Anything After Your Visit  If you have any questions or concerns for your doctor, please call our main line at [phone] and press option 4 to reach your doctor's medical assistant. If no one answers, please leave a voicemail as directed and we will return your call as soon as possible. Messages left after 4 pm will be answered the following business day.   You may also send [place] a message via MyChart. We typically respond to MyChart messages within 1-2 business days.  For prescription refills, please ask your pharmacy to contact our office. Our fax number is [phone].  If you have an urgent issue when the clinic is closed that cannot wait until the next business day, you can page your doctor at the number below.    Please note that while we do our best to be available for urgent issues outside of office hours, we are not available 24/7.   If you have an urgent issue and are unable to reach [place], you may choose to seek medical care at your doctor's office, retail clinic, urgent care center, or emergency room.  If you have a medical emergency, please immediately call 911 or go to the emergency department.  Pager  Numbers  - Dr. Gwen Pounds: [phone]  - Dr. Roseanne Reno: [phone]  - Dr. Katrinka Blazing: [phone]   In the event of inclement weather, please call our main line at [phone] for an update on the status of any delays or closures.  Dermatology Medication Tips: Please keep the boxes that topical medications come in in order to help keep track of the instructions about where and how to use these. Pharmacies typically print the medication instructions only on the boxes and not directly on the medication tubes.   If your medication is too expensive, please contact our office at [phone] option 4 or send [place] a message through MyChart.   We are unable to tell what your co-pay for medications will be in advance as this is different depending on your insurance coverage. However, we may be able to find a substitute medication at lower cost or fill out paperwork to get insurance to cover a needed medication.   If a prior authorization is required to get your medication covered by your insurance company, please allow [place] 1-2 business days to complete this process.  Drug prices often vary depending on where the prescription is filled and some pharmacies may offer cheaper prices.  The website www.goodrx.com contains coupons for medications through different pharmacies. The prices here do not account for what the cost may be with help from insurance (it may be cheaper with your insurance), but the website can give  you the price if you did not use any insurance.  - You can print the associated coupon and take it with your prescription to the pharmacy.  - You may also stop by our office during regular business hours and pick up a GoodRx coupon card.  - If you need your prescription sent electronically to a different pharmacy, notify our office through [facility] or by phone at [phone] option 4.     Si Usted Necesita Algo Despus de Su Visita  Tambin puede enviarnos un mensaje a travs de  Clinical cytogeneticist. Por lo general respondemos a los mensajes de MyChart en el transcurso de 1 a 2 das hbiles.  Para renovar recetas, por favor pida a su farmacia que se ponga en contacto con nuestra oficina. Annie Sable de fax es [place] [phone].  Si tiene un asunto urgente cuando la clnica est cerrada y que no puede esperar hasta el siguiente da hbil, puede llamar/localizar a su doctor(a) al nmero que aparece a continuacin.   Por favor, tenga en cuenta que aunque hacemos todo lo posible para estar disponibles para asuntos urgentes fuera del horario de [place], no estamos disponibles las 24 horas del da, los 7 [address]  [address] de la [place].   Si tiene un problema urgente y no puede comunicarse con nosotros, puede optar por buscar atencin mdica  en el consultorio de su doctor(a), en una clnica privada, en un centro de atencin urgente o en una sala de emergencias.  Si tiene Engineer, drilling, por favor llame inmediatamente al 911 o vaya a la sala de emergencias.  Nmeros de bper  - Dr. Gwen Pounds: [phone]  - Dra. Roseanne Reno: [phone]  - Dr. Katrinka Blazing: [phone]   En caso de inclemencias del tiempo, por favor llame a Lacy Duverney principal al [phone] para una actualizacin sobre el [place] de cualquier retraso o cierre.  Consejos para la medicacin en dermatologa: Por favor, guarde las cajas en las que vienen los medicamentos de uso tpico para ayudarle a seguir las instrucciones sobre dnde y cmo usarlos. Las farmacias generalmente imprimen las instrucciones del medicamento slo en las cajas y no directamente en los tubos del [place].   Si su medicamento es muy caro, por favor, pngase en contacto con Rolm [place]ala llamando al [phone] y presione la opcin 4 o envenos un mensaje a travs de Clinical cytogeneticist.   No podemos decirle cul ser su copago por los medicamentos por adelantado ya que esto es diferente dependiendo de la cobertura de su seguro. Sin embargo, es posible que  podamos encontrar un medicamento sustituto a Audiological scientist un formulario para que el seguro cubra el medicamento que se considera necesario.   Si se requiere una autorizacin previa para que su compaa de seguros [place] su medicamento, por favor permtanos de 1 a 2 das hbiles para completar [address]  Los precios de los medicamentos varan con frecuencia dependiendo del Environmental consultant de dnde se surte la receta y alguna farmacias pueden ofrecer precios ms baratos.  El sitio web www.goodrx.com tiene cupones para medicamentos de Health and safety inspector. Los precios aqu no tienen en cuenta lo que podra costar con la ayuda del seguro (puede ser ms barato con su seguro), pero el sitio web puede darle el precio si no utiliz Tourist information centre manager.  - Puede imprimir el cupn correspondiente y llevarlo con su receta a la farmacia.  - Tambin puede pasar por nuestra oficina durante el horario de atencin regular y Education officer, museum una tarjeta de cupones de GoodRx.  -  Si necesita que su receta se enve electrnicamente a Psychiatrist, informe a nuestra oficina a travs de MyChart de [place] o por telfono llamando al [phone] y presione la opcin 4.

## 2023-06-23 NOTE — Progress Notes (Signed)
 New Patient Visit   Subjective  Kristin Mcdowell is a 63 y.o. female who presents for the following: bump at back of scalp and upper back present for about 3 years, pt reports bumps go away and come back, not flaring now but feels like a knot, was seen in March 2021 had biopsies done that proved lichen simplex chronicus and scarring alopecia consistent with [employer] scarring alopecia. Pt also wanting to be seen for hair loss has struggled with this since having COVID x3 times. Pt was taking super collagen for hair growth for about 2 months with no help, pt has also tried mens Rogaine 2 months but did not work either.   The patient has spots, moles and lesions to be evaluated, some may be new or changing and the patient may have concern these could be cancer.   The following portions of the chart were reviewed this encounter and updated as appropriate: medications, allergies, medical history  Review of Systems:  No other skin or systemic complaints except as noted in HPI or Assessment and Plan.  Objective  Well appearing patient in no apparent distress; mood and affect are within normal limits.  A focused examination was performed of the following areas: Scalp, back   Relevant exam findings are noted in the Assessment and Plan.  Posterior parietal scalp 8mm skin colored indurated papule    Assessment & Plan   EPIDERMAL INCLUSION CYST Exam: Subcutaneous nodule at upper back  Benign-appearing. Exam most consistent with an epidermal inclusion cyst. Discussed that a cyst is a benign growth that can grow over time and sometimes get irritated or inflamed. Recommend observation if it is not bothersome. Discussed option of surgical excision to remove it if it is growing, symptomatic, or other changes noted. Please call for new or changing lesions so they can be evaluated. Patient defers excision  LICHEN SIMPLEX CHRONICUS/PRURIGO NODULARIS Exam: indurated lichenified papule at  scalp  Chronic and persistent condition with duration or expected duration over one year. Condition is bothersome/symptomatic for patient. Currently flared.   Lichen simplex chronicus [facility]) is a persistent itchy area of thickened skin that is induced by chronic rubbing and/or scratching (chronic dermatitis).  These areas may be pink, hyperpigmented and may have excoriations and bumps (prurigo nodules- PN).  LSC/PN is commonly observed in uncontrolled atopic dermatitis and other forms of eczema, and in other itchy skin conditions (eg, insect bites, scabies).  Sometimes it is not possible to know initial cause of LSC/PN if it has been present for a long time.  It generally responds well to treatment with high potency topical steroids.  It is important to stop rubbing/scratching the area in order to break the itch-scratch-rash-itch cycle, in order for the rash to resolve.   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 No erythema, scale, or purulence  Chronic and persistent condition with duration or expected duration over one year. Condition is bothersome/symptomatic for patient. Currently flared.   Female Androgenic Alopecia is a chronic condition related to genetics and/or hormonal changes.  In women androgenetic alopecia is commonly associated with menopause but may occur any time after puberty.  It causes hair thinning primarily on the crown with widening of the part and temporal hairline recession.  Can use OTC Rogaine (minoxidil) 5% solution/foam as directed.  Oral treatments in female  patients who have no contraindication may include : - Low dose oral minoxidil 1.25 - 5mg  daily   Treatment Plan: Clinically suggestive of past inflammatory alopecia and pattern hair loss. No evidence of active  inflammation Discussed topical or oral minoxidil, side effects include hypertrichosis elsewhere on face and body. Patient declines Patient tried topical minoxidil for 2 months. Discussed that 8+ months are needed for benefit. Patient declines Discussed option of consultation with Dr Isaac Laud or Dr Acquanetta Sit. Information given for Dr Caryn Section.  EIC (EPIDERMAL INCLUSION CYST)   PRURIGO NODULARIS Posterior parietal scalp Skin / nail biopsy - Posterior parietal scalp Type of biopsy: tangential   Informed consent: discussed and consent obtained   Timeout: patient name, date of birth, surgical site, and procedure verified   Procedure prep:  Patient was prepped and draped in usual sterile fashion Prep type:  Isopropyl alcohol Anesthesia: the lesion was anesthetized in a standard fashion   Anesthetic:  1% lidocaine w/ epinephrine 1-100,000 buffered w/ 8.4% NaHCO3 Instrument used: DermaBlade   Hemostasis achieved with: pressure and aluminum chloride   Outcome: patient tolerated procedure well   Post-procedure details: sterile dressing applied and wound care instructions given   Dressing type: bandage and petrolatum   Specimen 1 - Surgical pathology Differential Diagnosis: Prurigo nodule vs LSC vs Other  Check Margins: No 8mm skin colored indurated papule FEMALE PATTERN ALOPECIA    Return if symptoms worsen or fail to improve, for w/ Dr. Katrinka Blazing.  Wynonia Lawman, CMA, am acting as scribe for Elie Goody, MD .   Documentation: I have reviewed the above documentation for accuracy and completeness, and I agree with the above.  Elie Goody, MD

## 2023-06-28 ENCOUNTER — Telehealth: Payer: Self-pay

## 2023-06-28 LAB — SURGICAL PATHOLOGY

## 2023-06-28 NOTE — Telephone Encounter (Signed)
Discussed pathology results. Patient voiced understanding.

## 2023-06-28 NOTE — Telephone Encounter (Signed)
-----   Message from [place] sent at 06/28/2023  1:31 PM EDT ----- posterior parietal scalp :       PRURIGO NODULARIS   Please call to share that biopsy from scalp shows prurigo nodule as expected (thickened skin due to chronic irritation or scratching). It should resolve after the biopsy. If it recurs and patient desires treatment, they can call for an appointment

## 2023-10-20 ENCOUNTER — Telehealth: Payer: Self-pay

## 2023-10-20 NOTE — Telephone Encounter (Signed)
 Patient called asking can we please send the referral now to Dr. Greig Baseman at [facility] for her hair loss. Okay to send?

## 2023-10-20 NOTE — Telephone Encounter (Signed)
 Information faxed to Dr. Greig Baseman. aw

## 2024-01-19 ENCOUNTER — Other Ambulatory Visit: Payer: Self-pay | Admitting: Orthopedic Surgery

## 2024-01-19 DIAGNOSIS — M7522 Bicipital tendinitis, left shoulder: Secondary | ICD-10-CM

## 2024-01-19 DIAGNOSIS — M25512 Pain in left shoulder: Secondary | ICD-10-CM

## 2024-01-19 DIAGNOSIS — M7582 Other shoulder lesions, left shoulder: Secondary | ICD-10-CM

## 2024-01-19 DIAGNOSIS — M19012 Primary osteoarthritis, left shoulder: Secondary | ICD-10-CM

## 2024-01-26 ENCOUNTER — Other Ambulatory Visit: Payer: Self-pay | Admitting: Orthopedic Surgery

## 2024-01-26 DIAGNOSIS — M25512 Pain in left shoulder: Secondary | ICD-10-CM

## 2024-01-26 DIAGNOSIS — M7582 Other shoulder lesions, left shoulder: Secondary | ICD-10-CM

## 2024-01-26 DIAGNOSIS — M19012 Primary osteoarthritis, left shoulder: Secondary | ICD-10-CM

## 2024-01-26 DIAGNOSIS — M7522 Bicipital tendinitis, left shoulder: Secondary | ICD-10-CM

## 2024-02-02 ENCOUNTER — Ambulatory Visit
Admission: RE | Admit: 2024-02-02 | Discharge: 2024-02-02 | Disposition: A | Discharge status: Home / Self Care | Source: Ambulatory Visit | Attending: Orthopedic Surgery | Admitting: Orthopedic Surgery

## 2024-02-02 DIAGNOSIS — M7582 Other shoulder lesions, left shoulder: Secondary | ICD-10-CM | POA: Insufficient documentation

## 2024-02-02 DIAGNOSIS — M19012 Primary osteoarthritis, left shoulder: Secondary | ICD-10-CM | POA: Insufficient documentation

## 2024-02-02 DIAGNOSIS — M7522 Bicipital tendinitis, left shoulder: Secondary | ICD-10-CM | POA: Diagnosis present

## 2024-02-02 DIAGNOSIS — M25512 Pain in left shoulder: Secondary | ICD-10-CM | POA: Insufficient documentation
